# Patient Record
Sex: Female | Born: 1960 | Race: White | Hispanic: No | Marital: Married | Smoking: Never smoker
Health system: Southern US, Community
[De-identification: ages and names within clinical notes are randomized; demographics above are authoritative.]

## PROBLEM LIST (undated history)

## (undated) DIAGNOSIS — M25569 Pain in unspecified knee: Secondary | ICD-10-CM

## (undated) DIAGNOSIS — Z87442 Personal history of urinary calculi: Secondary | ICD-10-CM

## (undated) DIAGNOSIS — M1990 Unspecified osteoarthritis, unspecified site: Secondary | ICD-10-CM

## (undated) DIAGNOSIS — G43909 Migraine, unspecified, not intractable, without status migrainosus: Secondary | ICD-10-CM

## (undated) DIAGNOSIS — E785 Hyperlipidemia, unspecified: Secondary | ICD-10-CM

## (undated) DIAGNOSIS — T4145XA Adverse effect of unspecified anesthetic, initial encounter: Secondary | ICD-10-CM

## (undated) DIAGNOSIS — M2550 Pain in unspecified joint: Secondary | ICD-10-CM

## (undated) DIAGNOSIS — R0602 Shortness of breath: Secondary | ICD-10-CM

## (undated) DIAGNOSIS — Z803 Family history of malignant neoplasm of breast: Secondary | ICD-10-CM

## (undated) DIAGNOSIS — I2510 Atherosclerotic heart disease of native coronary artery without angina pectoris: Secondary | ICD-10-CM

## (undated) DIAGNOSIS — J189 Pneumonia, unspecified organism: Secondary | ICD-10-CM

## (undated) DIAGNOSIS — I10 Essential (primary) hypertension: Secondary | ICD-10-CM

## (undated) DIAGNOSIS — Z8489 Family history of other specified conditions: Secondary | ICD-10-CM

## (undated) DIAGNOSIS — R51 Headache: Secondary | ICD-10-CM

## (undated) DIAGNOSIS — R519 Headache, unspecified: Secondary | ICD-10-CM

## (undated) DIAGNOSIS — R600 Localized edema: Secondary | ICD-10-CM

## (undated) DIAGNOSIS — K829 Disease of gallbladder, unspecified: Secondary | ICD-10-CM

## (undated) DIAGNOSIS — M79606 Pain in leg, unspecified: Secondary | ICD-10-CM

## (undated) DIAGNOSIS — G4730 Sleep apnea, unspecified: Secondary | ICD-10-CM

## (undated) DIAGNOSIS — K5900 Constipation, unspecified: Secondary | ICD-10-CM

## (undated) DIAGNOSIS — Z8049 Family history of malignant neoplasm of other genital organs: Secondary | ICD-10-CM

## (undated) HISTORY — DX: Pain in unspecified joint: M25.50

## (undated) HISTORY — DX: Pain in leg, unspecified: M79.606

## (undated) HISTORY — PX: BREAST EXCISIONAL BIOPSY: SUR124

## (undated) HISTORY — DX: Unspecified osteoarthritis, unspecified site: M19.90

## (undated) HISTORY — DX: Sleep apnea, unspecified: G47.30

## (undated) HISTORY — DX: Essential (primary) hypertension: I10

## (undated) HISTORY — DX: Family history of malignant neoplasm of other genital organs: Z80.49

## (undated) HISTORY — DX: Hyperlipidemia, unspecified: E78.5

## (undated) HISTORY — DX: Migraine, unspecified, not intractable, without status migrainosus: G43.909

## (undated) HISTORY — DX: Disease of gallbladder, unspecified: K82.9

## (undated) HISTORY — DX: Localized edema: R60.0

## (undated) HISTORY — DX: Constipation, unspecified: K59.00

## (undated) HISTORY — DX: Family history of malignant neoplasm of breast: Z80.3

## (undated) HISTORY — DX: Pain in unspecified knee: M25.569

## (undated) HISTORY — DX: Shortness of breath: R06.02

---

## 1993-03-20 DIAGNOSIS — T8859XA Other complications of anesthesia, initial encounter: Secondary | ICD-10-CM | POA: Insufficient documentation

## 1993-03-20 HISTORY — PX: CHOLECYSTECTOMY: SHX55

## 1993-03-20 HISTORY — DX: Other complications of anesthesia, initial encounter: T88.59XA

## 1997-08-31 ENCOUNTER — Encounter: Admission: RE | Admit: 1997-08-31 | Discharge: 1997-11-29 | Payer: Self-pay | Admitting: Internal Medicine

## 1997-09-17 ENCOUNTER — Emergency Department (HOSPITAL_COMMUNITY): Admission: EM | Admit: 1997-09-17 | Discharge: 1997-09-17 | Payer: Self-pay | Admitting: Emergency Medicine

## 1997-10-02 ENCOUNTER — Ambulatory Visit (HOSPITAL_COMMUNITY): Admission: RE | Admit: 1997-10-02 | Discharge: 1997-10-02 | Payer: Self-pay | Admitting: Emergency Medicine

## 1997-12-24 ENCOUNTER — Ambulatory Visit (HOSPITAL_COMMUNITY): Admission: RE | Admit: 1997-12-24 | Discharge: 1997-12-24 | Payer: Self-pay | Admitting: Obstetrics and Gynecology

## 1998-01-12 ENCOUNTER — Ambulatory Visit (HOSPITAL_COMMUNITY): Admission: RE | Admit: 1998-01-12 | Discharge: 1998-01-12 | Payer: Self-pay | Admitting: Family Medicine

## 1998-02-21 ENCOUNTER — Ambulatory Visit (HOSPITAL_COMMUNITY): Admission: RE | Admit: 1998-02-21 | Discharge: 1998-02-21 | Payer: Self-pay | Admitting: Obstetrics & Gynecology

## 1998-08-13 ENCOUNTER — Inpatient Hospital Stay (HOSPITAL_COMMUNITY): Admission: AD | Admit: 1998-08-13 | Discharge: 1998-08-13 | Payer: Self-pay | Admitting: Obstetrics and Gynecology

## 1998-10-07 ENCOUNTER — Inpatient Hospital Stay (HOSPITAL_COMMUNITY): Admission: AD | Admit: 1998-10-07 | Discharge: 1998-10-10 | Payer: Self-pay | Admitting: *Deleted

## 1998-11-17 ENCOUNTER — Other Ambulatory Visit: Admission: RE | Admit: 1998-11-17 | Discharge: 1998-11-17 | Payer: Self-pay | Admitting: Obstetrics and Gynecology

## 1999-12-31 ENCOUNTER — Ambulatory Visit (HOSPITAL_COMMUNITY): Admission: RE | Admit: 1999-12-31 | Discharge: 1999-12-31 | Payer: Self-pay | Admitting: Family Medicine

## 2000-03-12 ENCOUNTER — Other Ambulatory Visit: Admission: RE | Admit: 2000-03-12 | Discharge: 2000-03-12 | Payer: Self-pay | Admitting: Obstetrics and Gynecology

## 2001-10-15 ENCOUNTER — Encounter: Payer: Self-pay | Admitting: Emergency Medicine

## 2001-10-15 ENCOUNTER — Emergency Department (HOSPITAL_COMMUNITY): Admission: EM | Admit: 2001-10-15 | Discharge: 2001-10-15 | Payer: Self-pay | Admitting: Emergency Medicine

## 2003-01-02 ENCOUNTER — Encounter: Payer: Self-pay | Admitting: Emergency Medicine

## 2003-01-02 ENCOUNTER — Emergency Department (HOSPITAL_COMMUNITY): Admission: EM | Admit: 2003-01-02 | Discharge: 2003-01-03 | Payer: Self-pay | Admitting: Emergency Medicine

## 2004-03-03 ENCOUNTER — Ambulatory Visit: Payer: Self-pay | Admitting: Cardiology

## 2004-03-22 ENCOUNTER — Encounter: Admission: RE | Admit: 2004-03-22 | Discharge: 2004-06-20 | Payer: Self-pay

## 2004-04-21 ENCOUNTER — Ambulatory Visit: Payer: Self-pay | Admitting: Cardiology

## 2004-06-22 ENCOUNTER — Ambulatory Visit: Payer: Self-pay | Admitting: Cardiology

## 2004-07-14 ENCOUNTER — Ambulatory Visit: Payer: Self-pay | Admitting: Cardiology

## 2005-07-27 ENCOUNTER — Ambulatory Visit: Payer: Self-pay | Admitting: Cardiology

## 2006-09-27 ENCOUNTER — Ambulatory Visit: Payer: Self-pay | Admitting: Cardiology

## 2006-10-19 ENCOUNTER — Encounter: Payer: Self-pay | Admitting: Cardiology

## 2006-10-19 LAB — CONVERTED CEMR LAB
CO2: 28 meq/L (ref 19–32)
Chloride: 103 meq/L (ref 96–112)
Cholesterol: 160 mg/dL (ref 0–200)
Creatinine, Ser: 0.62 mg/dL (ref 0.40–1.20)
Glucose, Bld: 101 mg/dL — ABNORMAL HIGH (ref 70–99)
HDL: 50 mg/dL (ref >39)
Hgb A1c MFr Bld: 5.2 % (ref 4.6–6.1)
Sodium: 142 meq/L (ref 135–145)
Total CHOL/HDL Ratio: 3.2
VLDL: 25 mg/dL (ref 0–40)

## 2007-02-13 ENCOUNTER — Ambulatory Visit (HOSPITAL_COMMUNITY): Admission: RE | Admit: 2007-02-13 | Discharge: 2007-02-13 | Payer: Self-pay | Admitting: Obstetrics and Gynecology

## 2007-07-31 ENCOUNTER — Ambulatory Visit (HOSPITAL_COMMUNITY): Admission: RE | Admit: 2007-07-31 | Discharge: 2007-07-31 | Payer: Self-pay | Admitting: Podiatry

## 2008-07-04 DIAGNOSIS — E8881 Metabolic syndrome: Secondary | ICD-10-CM | POA: Insufficient documentation

## 2008-07-04 DIAGNOSIS — E785 Hyperlipidemia, unspecified: Secondary | ICD-10-CM | POA: Insufficient documentation

## 2008-07-04 DIAGNOSIS — I10 Essential (primary) hypertension: Secondary | ICD-10-CM | POA: Insufficient documentation

## 2008-07-29 ENCOUNTER — Telehealth: Payer: Self-pay | Admitting: Cardiology

## 2008-08-26 ENCOUNTER — Ambulatory Visit: Payer: Self-pay | Admitting: Cardiology

## 2008-09-28 ENCOUNTER — Emergency Department (HOSPITAL_COMMUNITY): Admission: EM | Admit: 2008-09-28 | Discharge: 2008-09-28 | Payer: Self-pay | Admitting: Emergency Medicine

## 2009-02-06 ENCOUNTER — Emergency Department (HOSPITAL_BASED_OUTPATIENT_CLINIC_OR_DEPARTMENT_OTHER): Admission: EM | Admit: 2009-02-06 | Discharge: 2009-02-06 | Payer: Self-pay | Admitting: Emergency Medicine

## 2009-03-03 ENCOUNTER — Ambulatory Visit (HOSPITAL_COMMUNITY): Admission: RE | Admit: 2009-03-03 | Discharge: 2009-03-03 | Payer: Self-pay | Admitting: Obstetrics and Gynecology

## 2009-03-08 ENCOUNTER — Emergency Department (HOSPITAL_BASED_OUTPATIENT_CLINIC_OR_DEPARTMENT_OTHER): Admission: EM | Admit: 2009-03-08 | Discharge: 2009-03-08 | Payer: Self-pay | Admitting: Emergency Medicine

## 2009-03-08 ENCOUNTER — Ambulatory Visit: Payer: Self-pay | Admitting: Diagnostic Radiology

## 2009-03-09 ENCOUNTER — Telehealth: Payer: Self-pay | Admitting: Cardiology

## 2009-03-10 ENCOUNTER — Encounter: Admission: RE | Admit: 2009-03-10 | Discharge: 2009-03-10 | Payer: Self-pay | Admitting: Obstetrics and Gynecology

## 2009-08-04 ENCOUNTER — Encounter: Admission: RE | Admit: 2009-08-04 | Discharge: 2009-08-04 | Payer: Self-pay | Admitting: Obstetrics and Gynecology

## 2009-08-05 ENCOUNTER — Encounter: Admission: RE | Admit: 2009-08-05 | Discharge: 2009-08-05 | Payer: Self-pay | Admitting: Obstetrics and Gynecology

## 2009-12-15 ENCOUNTER — Ambulatory Visit: Payer: Self-pay | Admitting: Cardiology

## 2009-12-15 DIAGNOSIS — E663 Overweight: Secondary | ICD-10-CM | POA: Insufficient documentation

## 2009-12-17 LAB — CONVERTED CEMR LAB
Alkaline Phosphatase: 65 units/L (ref 39–117)
Chloride: 105 meq/L (ref 96–112)
GFR calc non Af Amer: 116.88 mL/min (ref >60)
HDL: 45.7 mg/dL (ref >39.00)
Sodium: 141 meq/L (ref 135–145)
TSH: 1.89 microintl units/mL (ref 0.35–5.50)

## 2010-03-08 ENCOUNTER — Encounter
Admission: RE | Admit: 2010-03-08 | Discharge: 2010-03-08 | Payer: Self-pay | Source: Home / Self Care | Attending: Obstetrics and Gynecology | Admitting: Obstetrics and Gynecology

## 2010-04-10 ENCOUNTER — Encounter: Payer: Self-pay | Admitting: Obstetrics and Gynecology
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
## 2010-06-20 LAB — BASIC METABOLIC PANEL
BUN: 9 mg/dL (ref 6–23)
CO2: 30 mEq/L (ref 19–32)
Calcium: 9.2 mg/dL (ref 8.4–10.5)
Creatinine, Ser: 0.6 mg/dL (ref 0.4–1.2)
GFR calc non Af Amer: >60 mL/min (ref >60)
Glucose, Bld: 87 mg/dL (ref 70–99)

## 2010-06-20 LAB — CBC
HCT: 42 % (ref 36.0–46.0)
Hemoglobin: 13.9 g/dL (ref 12.0–15.0)
Platelets: 285 10*3/uL (ref 150–400)
RBC: 4.72 MIL/uL (ref 3.87–5.11)
WBC: 9.4 10*3/uL (ref 4.0–10.5)

## 2010-06-20 LAB — DIFFERENTIAL
Eosinophils Relative: 2 % (ref 0–5)
Lymphocytes Relative: 21 % (ref 12–46)
Monocytes Absolute: 0.6 10*3/uL (ref 0.1–1.0)
Monocytes Relative: 6 % (ref 3–12)
Neutro Abs: 6.3 10*3/uL (ref 1.7–7.7)

## 2010-06-20 LAB — URINALYSIS, ROUTINE W REFLEX MICROSCOPIC
Protein, ur: NEGATIVE mg/dL
Urobilinogen, UA: 0.2 mg/dL (ref 0.0–1.0)

## 2010-06-22 LAB — WOUND CULTURE

## 2010-06-30 ENCOUNTER — Other Ambulatory Visit: Payer: Self-pay | Admitting: Sports Medicine

## 2010-06-30 DIAGNOSIS — M25512 Pain in left shoulder: Secondary | ICD-10-CM

## 2010-07-01 ENCOUNTER — Ambulatory Visit
Admission: RE | Admit: 2010-07-01 | Discharge: 2010-07-01 | Disposition: A | Discharge status: Home / Self Care | Payer: BLUE CROSS/BLUE SHIELD | Source: Ambulatory Visit | Attending: Sports Medicine | Admitting: Sports Medicine

## 2010-07-01 DIAGNOSIS — M25512 Pain in left shoulder: Secondary | ICD-10-CM

## 2010-08-02 NOTE — Assessment & Plan Note (Signed)
[facility]                            CARDIOLOGY OFFICE NOTE   NAME:Booker, Anita Booker               MRN:          [id number]  DATE:09/27/2006                            DOB:          1960/04/22    Anita Booker comes in today for further management of her metabolic  syndrome, hypertension, and hyperlipidemia.   She has not been in the office in 2 years.  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  She has a total of 6 children!  She has no complaints other  than lower extremity edema.  It is particularly bad at the end of the  day.   MEDICINES:  1. Lipitor 20 mg a day.  2. Diovan HCT 160/25 daily.  3. Celebrex 200 mg a day.   Her blood pressure today is 146/95.  She brings a whole host of blood  pressures from work which show her under good control except for an  occasional one that is in the 140s.  Her diastolics are always good.  Her pulse today is 90 and regular.  Her weight is 268, down 9.  HEENT:  Normocephalic/atraumatic, PERRLA, extraocular movements intact,  sclerae are clear, facial symmetry is normal.  Carotid upstrokes were  equal bilaterally without bruits, no JVD, thyroid is not enlarged,  trachea is midline.  LUNGS:  Clear.  HEART:  Reveals a nondisplaced PMI, normal S1-S2 without gallop.  ABDOMINAL:  Soft, good bowel sounds.  EXTREMITIES:  Reveal 1-2+ pitting edema, pulses are intact.  NEURO:  Exam is intact.   EKG shows normal sinus rhythm with low voltage through every lead.  She  had poor R wave progression across the anterior precordium which appears  to be new, I suspect this is lead placement.   ASSESSMENT:  Anita Booker's lower extremity edema is worse.  This is  clearly multifactorial.  She is due blood work.   PLAN:  I have renewed all her medications but changed her Diovan  HCT to  just Diovan 160 daily.  I have also added furosemide 20 mg a day for  edema.  I renewed her Lipitor 20 mg a  day.  Will arrange for her to get fasting blood work including a chem-7,  LFTs, lipid profile, hemoglobin A1c in the next couple weeks.  I will  see her back again in a year.     Thomas C. Daleen Squibb, MD, [facility]  Electronically Signed    TCW/MedQ  DD: 09/27/2006  DT: 09/28/2006  Job #: [id number]   cc:   Sherry A. Rosalio Macadamia, M.D.

## 2010-12-09 ENCOUNTER — Telehealth: Payer: Self-pay | Admitting: Cardiology

## 2010-12-09 DIAGNOSIS — I10 Essential (primary) hypertension: Secondary | ICD-10-CM

## 2010-12-09 DIAGNOSIS — E663 Overweight: Secondary | ICD-10-CM

## 2010-12-09 DIAGNOSIS — E785 Hyperlipidemia, unspecified: Secondary | ICD-10-CM

## 2010-12-09 MED ORDER — FUROSEMIDE 20 MG PO TABS: 20.0000 mg | ORAL_TABLET | Freq: Every day | ORAL | Status: DC

## 2010-12-09 MED ORDER — ATORVASTATIN CALCIUM 20 MG PO TABS: 20.0000 mg | ORAL_TABLET | Freq: Every day | ORAL | Status: DC

## 2010-12-09 MED ORDER — VALSARTAN 160 MG PO TABS: 160.0000 mg | ORAL_TABLET | Freq: Every day | ORAL | Status: DC

## 2010-12-09 NOTE — Telephone Encounter (Signed)
Pt calling stating that she called for refill RX of Lasix 10 mg called Monday and then today and the RX is not ready. Therefore pt is going to need some samples or something in the mean time until pt Lasix comes in. Please return pt call to discuss further.

## 2010-12-09 NOTE — Telephone Encounter (Signed)
Prescriptions for lasix, diovan, and lipitor reordered. Fasting labs prior to pt 01/27/11 appt ordered for 01/25/11 I spoke with pt and she is out of her furosemide. Prescription called in. Mylo Red RN

## 2010-12-15 ENCOUNTER — Other Ambulatory Visit: Payer: Self-pay | Admitting: *Deleted

## 2010-12-15 MED ORDER — FUROSEMIDE 20 MG PO TABS: 20.0000 mg | ORAL_TABLET | Freq: Every day | ORAL | Status: DC

## 2011-01-25 ENCOUNTER — Other Ambulatory Visit (INDEPENDENT_AMBULATORY_CARE_PROVIDER_SITE_OTHER): Payer: BLUE CROSS/BLUE SHIELD | Admitting: *Deleted

## 2011-01-25 ENCOUNTER — Encounter: Payer: Self-pay | Admitting: *Deleted

## 2011-01-25 DIAGNOSIS — E663 Overweight: Secondary | ICD-10-CM

## 2011-01-25 DIAGNOSIS — E785 Hyperlipidemia, unspecified: Secondary | ICD-10-CM

## 2011-01-25 DIAGNOSIS — I10 Essential (primary) hypertension: Secondary | ICD-10-CM

## 2011-01-25 LAB — LIPID PANEL
Total CHOL/HDL Ratio: 4
VLDL: 15 mg/dL (ref 0.0–40.0)

## 2011-01-25 LAB — BASIC METABOLIC PANEL
BUN: 11 mg/dL (ref 6–23)
CO2: 30 mEq/L (ref 19–32)
GFR: 95.23 mL/min (ref >60.00)
Glucose, Bld: 92 mg/dL (ref 70–99)
Potassium: 3.8 mEq/L (ref 3.5–5.1)

## 2011-01-25 LAB — HEPATIC FUNCTION PANEL
ALT: 16 U/L (ref 0–35)
Bilirubin, Direct: 0.2 mg/dL (ref 0.0–0.3)
Total Bilirubin: 1.5 mg/dL — ABNORMAL HIGH (ref 0.3–1.2)

## 2011-01-27 ENCOUNTER — Ambulatory Visit (INDEPENDENT_AMBULATORY_CARE_PROVIDER_SITE_OTHER): Payer: BC Managed Care – PPO | Admitting: Cardiology

## 2011-01-27 ENCOUNTER — Encounter: Payer: Self-pay | Admitting: Cardiology

## 2011-01-27 VITALS — BP 142/88 | HR 82 | Ht 63.0 in | Wt 313.0 lb

## 2011-01-27 DIAGNOSIS — E785 Hyperlipidemia, unspecified: Secondary | ICD-10-CM

## 2011-01-27 DIAGNOSIS — I10 Essential (primary) hypertension: Secondary | ICD-10-CM

## 2011-01-27 DIAGNOSIS — E8881 Metabolic syndrome: Secondary | ICD-10-CM

## 2011-01-27 DIAGNOSIS — E663 Overweight: Secondary | ICD-10-CM

## 2011-01-27 MED ORDER — ATORVASTATIN CALCIUM 40 MG PO TABS: 40.0000 mg | ORAL_TABLET | Freq: Every day | ORAL | Status: DC

## 2011-01-27 NOTE — Progress Notes (Signed)
HPI Anita Booker comes in today for evaluation and management of her metabolic syndrome including hypertension, obesity, mixed hyperlipidemia, and increased risk for diabetes.  She is now seeing Dr. Foy Guadalajara for primary care. He  Her blood pressure has been under good control at home. She does not take her Lasix or her Diovan until the afternoon. It is elevated today but this is a morning appointment.  She denies any chest pain or ischemic symptoms. Denies orthopnea, PND or peripheral edema.  Her last cholesterol was 166 with an LDL 105, HDL 45 and 8 triglycerides were normal at 75. LFTs were normal. Vasculature was normal. TSH was normal. Hemoglobin A1c was 5.6. I reviewed this with her today and congratulated her on being compliant her weight stable.  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 She is not able to exercise.   Past Medical History  Diagnosis Date  . Unspecified essential hypertension   . Other and unspecified hyperlipidemia   . Dysmetabolic syndrome X     Past Surgical History  Procedure Date  . Cholecystectomy 1995    Family History  Problem Relation Age of Onset  . Lung cancer Father 57  . Heart attack Brother 68    History   Social History  . Marital Status: Married    Spouse Name: N/A    Number of Children: N/A  . Years of Education: N/A   Occupational History  . Not on file.   Social History Main Topics  . Smoking status: Never Smoker   . Smokeless tobacco: Not on file  . Alcohol Use: No  . Drug Use: No  . Sexually Active: Not on file   Other Topics Concern  . Not on file   Social History Narrative  . No narrative on file    Allergies  Allergen Reactions  . Codeine Phosphate     REACTION: unspecified    Current Outpatient Prescriptions  Medication Sig Dispense Refill  . aspirin 81 MG tablet Take 81 mg by mouth daily.        Marland Kitchen atorvastatin (LIPITOR) 20 MG tablet Take 1 tablet (20 mg total) by mouth daily.  90 tablet  3  . celecoxib  (CELEBREX) 200 MG capsule Take 200 mg by mouth daily.        . furosemide (LASIX) 20 MG tablet Take 1 tablet (20 mg total) by mouth daily.  30 tablet  1  . valsartan (DIOVAN) 160 MG tablet Take 1 tablet (160 mg total) by mouth daily.  90 tablet  3    ROS Negative other than HPI.   PE General Appearance: well developed, well nourished in no acute distress, obese HEENT: symmetrical face, PERRLA, good dentition  Neck: no JVD, thyromegaly, or adenopathy, trachea midline Chest: symmetric without deformity Cardiac: PMI non-displaced, RRR, normal S1, S2, no gallop or murmur Lung: clear to ausculation and percussion Vascular: all pulses full without bruits  Abdominal: nondistended, nontender, good bowel sounds, no HSM, no bruits Extremities: no cyanosis, clubbing or edema, no sign of DVT, no varicosities  Skin: normal color, no rashes Neuro: alert and oriented x 3, non-focal Pysch: normal affect Filed Vitals:   01/27/11 0938  BP: 142/88  Pulse: 82  Height: 5\' 3"  (1.6 m)  Weight: 313 lb (141.976 kg)    EKG Normal sinus rhythm, low voltage, otherwise normal EKG. Labs and Studies Reviewed.   Lab Results  Component Value Date   WBC 9.4 03/08/2009   HGB 13.9 03/08/2009  HCT 42.0 03/08/2009   MCV 89.0 03/08/2009   PLT 285 03/08/2009      Chemistry      Component Value Date/Time   NA 140 01/25/2011 0859   K 3.8 01/25/2011 0859   CL 104 01/25/2011 0859   CO2 30 01/25/2011 0859   BUN 11 01/25/2011 0859   CREATININE 0.7 01/25/2011 0859      Component Value Date/Time   CALCIUM 8.8 01/25/2011 0859   ALKPHOS 70 01/25/2011 0859   AST 16 01/25/2011 0859   ALT 16 01/25/2011 0859   BILITOT 1.5* 01/25/2011 0859       Lab Results  Component Value Date   CHOL 166 01/25/2011   CHOL 162 12/15/2009   CHOL 160 10/19/2006   Lab Results  Component Value Date   HDL 45.80 01/25/2011   HDL 16.10 12/15/2009   HDL 50 10/19/2006   Lab Results  Component Value Date   LDLCALC 105* 01/25/2011    LDLCALC 88 12/15/2009   LDLCALC 85 10/19/2006   Lab Results  Component Value Date   TRIG 75.0 01/25/2011   TRIG 141.0 12/15/2009   TRIG 123 10/19/2006   Lab Results  Component Value Date   CHOLHDL 4 01/25/2011   CHOLHDL 4 12/15/2009   CHOLHDL 3.2 Ratio 10/19/2006   Lab Results  Component Value Date   HGBA1C 5.6 01/25/2011   Lab Results  Component Value Date   ALT 16 01/25/2011   AST 16 01/25/2011   ALKPHOS 70 01/25/2011   BILITOT 1.5* 01/25/2011   Lab Results  Component Value Date   TSH 1.37 01/25/2011

## 2011-01-27 NOTE — Patient Instructions (Signed)
Your physician has recommended you make the following change in your medication:   Increase Atorvastatin ( Lipitor)  Your physician recommends that you return for lab work in: 6 week for fasting cholesterol labs & liver  Your physician recommends that you schedule a follow-up appointment in: as needed with Dr. Daleen Squibb

## 2011-01-27 NOTE — Assessment & Plan Note (Signed)
This is a well controlled except for blood pressure. I've advised to take her valsartan in the morning. Blood pressure goals given. Also encouraged to continue to keep her weight stable, continue the heart healthy which he does, and avoid excess salt. I will turn her care over to Dr. Foy Guadalajara of primary care. We'll be happy to see her if any cardiac issues arise. No changes in medications made.

## 2011-01-27 NOTE — Assessment & Plan Note (Signed)
Laboratory data reviewed and copy given. I have increased her atorvastatin 40 mg p.o. Q.h.s. Check lipids in 6 weeks. LDL goal less than 100.

## 2011-03-22 ENCOUNTER — Other Ambulatory Visit: Payer: Self-pay | Admitting: Cardiology

## 2011-03-22 NOTE — Telephone Encounter (Signed)
New Msg: Pt would like 90 day supply of refills.

## 2011-03-23 ENCOUNTER — Other Ambulatory Visit: Payer: BC Managed Care – PPO | Admitting: *Deleted

## 2011-03-23 MED ORDER — VALSARTAN 160 MG PO TABS: 160.0000 mg | ORAL_TABLET | Freq: Every day | ORAL | Status: DC

## 2011-03-23 MED ORDER — ATORVASTATIN CALCIUM 40 MG PO TABS: 40.0000 mg | ORAL_TABLET | Freq: Every day | ORAL | Status: DC

## 2011-03-23 MED ORDER — FUROSEMIDE 20 MG PO TABS: 20.0000 mg | ORAL_TABLET | Freq: Every day | ORAL | Status: DC

## 2011-04-07 ENCOUNTER — Telehealth: Payer: Self-pay | Admitting: *Deleted

## 2011-04-07 DIAGNOSIS — E785 Hyperlipidemia, unspecified: Secondary | ICD-10-CM

## 2011-04-07 NOTE — Telephone Encounter (Signed)
Called pt about overdue repeat lipid and liver since increased atorvastatin. Pt will come in 04/11/11  Mylo Red RN

## 2011-04-11 ENCOUNTER — Other Ambulatory Visit (INDEPENDENT_AMBULATORY_CARE_PROVIDER_SITE_OTHER): Payer: BC Managed Care – PPO | Admitting: *Deleted

## 2011-04-11 DIAGNOSIS — E785 Hyperlipidemia, unspecified: Secondary | ICD-10-CM

## 2011-04-11 LAB — LIPID PANEL
HDL: 45.1 mg/dL (ref >39.00)
Total CHOL/HDL Ratio: 3

## 2011-04-11 LAB — HEPATIC FUNCTION PANEL
AST: 19 U/L (ref 0–37)
Alkaline Phosphatase: 70 U/L (ref 39–117)
Bilirubin, Direct: 0.1 mg/dL (ref 0.0–0.3)
Total Protein: 6.8 g/dL (ref 6.0–8.3)

## 2011-06-30 ENCOUNTER — Encounter (HOSPITAL_COMMUNITY): Payer: Self-pay | Admitting: Emergency Medicine

## 2011-06-30 ENCOUNTER — Inpatient Hospital Stay (HOSPITAL_COMMUNITY)
Admission: EM | Admit: 2011-06-30 | Discharge: 2011-07-03 | DRG: 262 | Disposition: A | Discharge status: Home / Self Care | Payer: BC Managed Care – PPO | Attending: General Surgery | Admitting: General Surgery

## 2011-06-30 DIAGNOSIS — A4901 Methicillin susceptible Staphylococcus aureus infection, unspecified site: Secondary | ICD-10-CM | POA: Diagnosis present

## 2011-06-30 DIAGNOSIS — Z6841 Body Mass Index (BMI) 40.0 and over, adult: Secondary | ICD-10-CM

## 2011-06-30 DIAGNOSIS — I10 Essential (primary) hypertension: Secondary | ICD-10-CM | POA: Diagnosis present

## 2011-06-30 DIAGNOSIS — E8881 Metabolic syndrome: Secondary | ICD-10-CM | POA: Diagnosis present

## 2011-06-30 DIAGNOSIS — E785 Hyperlipidemia, unspecified: Secondary | ICD-10-CM | POA: Diagnosis present

## 2011-06-30 DIAGNOSIS — N61 Mastitis without abscess: Principal | ICD-10-CM

## 2011-06-30 DIAGNOSIS — Z79899 Other long term (current) drug therapy: Secondary | ICD-10-CM

## 2011-06-30 DIAGNOSIS — Z7982 Long term (current) use of aspirin: Secondary | ICD-10-CM

## 2011-06-30 LAB — BASIC METABOLIC PANEL
CO2: 29 mEq/L (ref 19–32)
Chloride: 98 mEq/L (ref 96–112)
GFR calc Af Amer: >90 mL/min (ref >90)
Sodium: 138 mEq/L (ref 135–145)

## 2011-06-30 LAB — DIFFERENTIAL
Basophils Absolute: 0 10*3/uL (ref 0.0–0.1)
Basophils Relative: 0 % (ref 0–1)
Lymphocytes Relative: 10 % — ABNORMAL LOW (ref 12–46)
Neutro Abs: 16.1 10*3/uL — ABNORMAL HIGH (ref 1.7–7.7)
Neutrophils Relative %: 84 % — ABNORMAL HIGH (ref 43–77)

## 2011-06-30 LAB — CBC
HCT: 40.3 % (ref 36.0–46.0)
MCHC: 32.8 g/dL (ref 30.0–36.0)
Platelets: 297 10*3/uL (ref 150–400)
RDW: 13.5 % (ref 11.5–15.5)
WBC: 19.2 10*3/uL — ABNORMAL HIGH (ref 4.0–10.5)

## 2011-06-30 MED ORDER — PIPERACILLIN-TAZOBACTAM 3.375 G IVPB
3.3750 g | Freq: Three times a day (TID) | INTRAVENOUS | Status: DC
  Administered 2011-07-01 – 2011-07-03 (×7): 3.375 g via INTRAVENOUS
  Filled 2011-06-30 (×11): qty 50

## 2011-06-30 MED ORDER — ONDANSETRON HCL 4 MG/2ML IJ SOLN
4.0000 mg | Freq: Once | INTRAMUSCULAR | Status: AC
  Administered 2011-06-30: 4 mg via INTRAVENOUS
  Filled 2011-06-30: qty 2

## 2011-06-30 MED ORDER — VANCOMYCIN HCL IN DEXTROSE 1-5 GM/200ML-% IV SOLN
1000.0000 mg | Freq: Once | INTRAVENOUS | Status: AC
  Administered 2011-06-30: 1000 mg via INTRAVENOUS
  Filled 2011-06-30: qty 200

## 2011-06-30 MED ORDER — VANCOMYCIN HCL 500 MG IV SOLR
500.0000 mg | Freq: Once | INTRAVENOUS | Status: AC
  Administered 2011-07-01: 500 mg via INTRAVENOUS
  Filled 2011-06-30: qty 500

## 2011-06-30 MED ORDER — MORPHINE SULFATE 2 MG/ML IJ SOLN
2.0000 mg | INTRAMUSCULAR | Status: DC | PRN
  Administered 2011-07-01: 2 mg via INTRAVENOUS
  Filled 2011-06-30: qty 1

## 2011-06-30 MED ORDER — VANCOMYCIN HCL 1000 MG IV SOLR
1500.0000 mg | Freq: Two times a day (BID) | INTRAVENOUS | Status: DC
  Administered 2011-07-01 – 2011-07-03 (×4): 1500 mg via INTRAVENOUS
  Filled 2011-06-30 (×6): qty 1500

## 2011-06-30 MED ORDER — KCL IN DEXTROSE-NACL 20-5-0.9 MEQ/L-%-% IV SOLN
INTRAVENOUS | Status: DC
  Administered 2011-07-01: 100 mL/h via INTRAVENOUS
  Filled 2011-06-30 (×4): qty 1000

## 2011-06-30 MED ORDER — PIPERACILLIN-TAZOBACTAM 3.375 G IVPB
3.3750 g | Freq: Once | INTRAVENOUS | Status: AC
  Administered 2011-07-01: 3.375 g via INTRAVENOUS
  Filled 2011-06-30: qty 50

## 2011-06-30 MED ORDER — MORPHINE SULFATE 4 MG/ML IJ SOLN: 4.0000 mg | Freq: Once | INTRAMUSCULAR | Status: DC

## 2011-06-30 MED ORDER — SODIUM CHLORIDE 0.9 % IV BOLUS (SEPSIS)
1000.0000 mL | Freq: Once | INTRAVENOUS | Status: AC
  Administered 2011-06-30: 1000 mL via INTRAVENOUS

## 2011-06-30 MED ORDER — MORPHINE SULFATE 2 MG/ML IJ SOLN
INTRAMUSCULAR | Status: AC
  Administered 2011-06-30: 4 mg via INTRAMUSCULAR
  Filled 2011-06-30: qty 2

## 2011-06-30 MED ORDER — OXYCODONE HCL 5 MG PO TABS
5.0000 mg | ORAL_TABLET | ORAL | Status: DC | PRN
  Administered 2011-07-01 – 2011-07-02 (×3): 5 mg via ORAL
  Filled 2011-06-30 (×3): qty 1

## 2011-06-30 MED ORDER — FUROSEMIDE 20 MG PO TABS
20.0000 mg | ORAL_TABLET | Freq: Every day | ORAL | Status: DC
  Administered 2011-07-02 – 2011-07-03 (×2): 20 mg via ORAL
  Filled 2011-06-30 (×3): qty 1

## 2011-06-30 MED ORDER — IRBESARTAN 150 MG PO TABS
150.0000 mg | ORAL_TABLET | Freq: Every day | ORAL | Status: DC
  Administered 2011-07-02 – 2011-07-03 (×2): 150 mg via ORAL
  Filled 2011-06-30 (×3): qty 1

## 2011-06-30 MED ORDER — ONDANSETRON HCL 4 MG/2ML IJ SOLN: 4.0000 mg | Freq: Four times a day (QID) | INTRAMUSCULAR | Status: DC | PRN

## 2011-06-30 NOTE — H&P (Signed)
Anita Booker is an 51 y.o. female.   Chief Complaint:   Right breast infection HPI:   This is a 51 year old female who noted redness in her right breast about 3 days ago. She saw her primary care physician a couple of days ago and was given intra-muscular Rocephin and started on Bactrim. She went back to her primary care physician today because the situation became worse despite oral antibiotic treatment.  She reports having fevers as well as headaches. She also said some nausea and vomiting. She denies any trauma to the breast. She denies having a previous infection like this. She gets mammograms every 6 months.  Past Medical History  Diagnosis Date  . Unspecified essential hypertension   . Other and unspecified hyperlipidemia   . Dysmetabolic syndrome X     Past Surgical History  Procedure Date  . Cholecystectomy 1995    Family History  Problem Relation Age of Onset  . Lung cancer Father 61  . Heart attack Brother 42   Social History:  reports that she has never smoked. She does not have any smokeless tobacco history on file. She reports that she does not drink alcohol or use illicit drugs. Prior to Admission medications   Medication Sig Start Date End Date Taking? Authorizing Provider  acetaminophen (TYLENOL) 500 MG tablet Take 500 mg by mouth every 6 (six) hours as needed. For pain relief   Yes Historical Provider, MD  aspirin 81 MG tablet Take 81 mg by mouth daily.     Yes Historical Provider, MD  atorvastatin (LIPITOR) 40 MG tablet Take 1 tablet (40 mg total) by mouth daily. 03/22/11 03/21/12 Yes Gaylord Shih, MD  celecoxib (CELEBREX) 200 MG capsule Take 200 mg by mouth daily.     Yes Historical Provider, MD  furosemide (LASIX) 20 MG tablet Take 1 tablet (20 mg total) by mouth daily. 03/22/11 03/21/12 Yes Gaylord Shih, MD  sulfamethoxazole-trimethoprim (BACTRIM DS,SEPTRA DS) 800-160 MG per tablet Take 1 tablet by mouth 2 (two) times daily.   Yes Historical Provider, MD  valsartan  (DIOVAN) 160 MG tablet Take 1 tablet (160 mg total) by mouth daily. 03/22/11 03/21/12 Yes Gaylord Shih, MD   Allergies:  Allergies  Allergen Reactions  . Codeine Phosphate     REACTION: unspecified    Medications Prior to Admission  Medication Dose Route Frequency Provider Last Rate Last Dose  . dextrose 5 % and 0.9 % NaCl with KCl 20 mEq/L infusion   Intravenous Continuous Adolph Pollack, MD      . furosemide (LASIX) tablet 20 mg  20 mg Oral Daily Adolph Pollack, MD      . irbesartan (AVAPRO) tablet 150 mg  150 mg Oral Daily Adolph Pollack, MD      . morphine 2 MG/ML injection 2-6 mg  2-6 mg Intravenous Q2H PRN Adolph Pollack, MD      . morphine 2 MG/ML injection        4 mg at 06/30/11 2134  . ondansetron (ZOFRAN) injection 4 mg  4 mg Intravenous Once Angus Seller, PA   4 mg at 06/30/11 2131  . ondansetron (ZOFRAN) injection 4 mg  4 mg Intravenous Q6H PRN Adolph Pollack, MD      . oxyCODONE (Oxy IR/ROXICODONE) immediate release tablet 5 mg  5 mg Oral Q4H PRN Adolph Pollack, MD      . piperacillin-tazobactam (ZOSYN) IVPB 3.375 g  3.375 g Intravenous Once Phill Mutter  Dammen, PA      . piperacillin-tazobactam (ZOSYN) IVPB 3.375 g  3.375 g Intravenous Q8H Adolph Pollack, MD      . sodium chloride 0.9 % bolus 1,000 mL  1,000 mL Intravenous Once Angus Seller, PA   1,000 mL at 06/30/11 2131  . vancomycin (VANCOCIN) IVPB 1000 mg/200 mL premix  1,000 mg Intravenous Once Angus Seller, PA   1,000 mg at 06/30/11 2251  . DISCONTD: morphine 4 MG/ML injection 4 mg  4 mg Intravenous Once Angus Seller, PA       Medications Prior to Admission  Medication Sig Dispense Refill  . aspirin 81 MG tablet Take 81 mg by mouth daily.        Marland Kitchen atorvastatin (LIPITOR) 40 MG tablet Take 1 tablet (40 mg total) by mouth daily.  90 tablet  3  . celecoxib (CELEBREX) 200 MG capsule Take 200 mg by mouth daily.        . furosemide (LASIX) 20 MG tablet Take 1 tablet (20 mg total) by mouth daily.  90  tablet  3  . valsartan (DIOVAN) 160 MG tablet Take 1 tablet (160 mg total) by mouth daily.  90 tablet  3   History   Social History  . Marital Status: Married    Spouse Name: N/A    Number of Children: N/A  . Years of Education: N/A   Occupational History  . Not on file.   Social History Main Topics  . Smoking status: Never Smoker   . Smokeless tobacco: Not on file  . Alcohol Use: No  . Drug Use: No  . Sexually Active: Not on file   Other Topics Concern  . Not on file   Social History Narrative  . No narrative on file   Family History  Problem Relation Age of Onset  . Lung cancer Father 3  . Heart attack Brother 48    Results for orders placed during the hospital encounter of 06/30/11 (from the past 48 hour(s))  CBC     Status: Abnormal   Collection Time   06/30/11  9:02 PM      Component Value Range Comment   WBC 19.2 (*) 4.0 - 10.5 (K/uL)    RBC 4.50  3.87 - 5.11 (MIL/uL)    Hemoglobin 13.2  12.0 - 15.0 (g/dL)    HCT 16.1  09.6 - 04.5 (%)    MCV 89.6  78.0 - 100.0 (fL)    MCH 29.3  26.0 - 34.0 (pg)    MCHC 32.8  30.0 - 36.0 (g/dL)    RDW 40.9  81.1 - 91.4 (%)    Platelets 297  150 - 400 (K/uL)   DIFFERENTIAL     Status: Abnormal   Collection Time   06/30/11  9:02 PM      Component Value Range Comment   Neutrophils Relative 84 (*) 43 - 77 (%)    Neutro Abs 16.1 (*) 1.7 - 7.7 (K/uL)    Lymphocytes Relative 10 (*) 12 - 46 (%)    Lymphs Abs 1.8  0.7 - 4.0 (K/uL)    Monocytes Relative 6  3 - 12 (%)    Monocytes Absolute 1.1 (*) 0.1 - 1.0 (K/uL)    Eosinophils Relative 1  0 - 5 (%)    Eosinophils Absolute 0.2  0.0 - 0.7 (K/uL)    Basophils Relative 0  0 - 1 (%)    Basophils Absolute 0.0  0.0 - 0.1 (  K/uL)   BASIC METABOLIC PANEL     Status: Abnormal   Collection Time   06/30/11  9:02 PM      Component Value Range Comment   Sodium 138  135 - 145 (mEq/L)    Potassium 3.4 (*) 3.5 - 5.1 (mEq/L)    Chloride 98  96 - 112 (mEq/L)    CO2 29  19 - 32 (mEq/L)     Glucose, Bld 96  70 - 99 (mg/dL)    BUN 8  6 - 23 (mg/dL)    Creatinine, Ser 4.09  0.50 - 1.10 (mg/dL)    Calcium 9.4  8.4 - 10.5 (mg/dL)    GFR calc non Af Amer >90  >90 (mL/min)    GFR calc Af Amer >90  >90 (mL/min)    No results found.  Review of Systems  Constitutional: Positive for fever and chills.  Respiratory: Negative for cough and shortness of breath.   Cardiovascular: Negative for chest pain.  Gastrointestinal: Positive for nausea and vomiting. Negative for abdominal pain and diarrhea.  Genitourinary: Negative for dysuria and hematuria.  Neurological: Positive for headaches.  Endo/Heme/Allergies: Does not bruise/bleed easily.    Blood pressure 140/75, pulse 89, temperature 98.8 F (37.1 C), temperature source Oral, resp. rate 20, SpO2 98.00%. Physical Exam  Constitutional:       Morbidly obese female in NAD.    HENT:  Head: Normocephalic and atraumatic.  Eyes: No scleral icterus.  Neck: Neck supple.  Cardiovascular: Normal rate and regular rhythm.   Respiratory: Effort normal and breath sounds normal.       The right breast is erythematous with a large firm indurated area laterally and a suggestion of fluctuance laterally.  The left breast is without masses or suspicious changes.  GI: Soft. She exhibits no distension and no mass. There is no tenderness.  Musculoskeletal: She exhibits no edema.       No axillary adenopathy.  Lymphadenopathy:    She has no cervical adenopathy.  Skin: Skin is warm and dry. There is erythema (Right breast).     Assessment/Plan Acute right mastitis with probable abscess. She also has a metabolic syndrome. She is currently receiving vancomycin in the emergency room  Plan: Admit to the hospital. Continue IV vancomycin and IV Zosyn. I've recommended that the situation does not improve significantly by tomorrow morning that she go to the operating room for an incision and drainage of what I feel is a right breast abscess. I've discussed  the procedure and the risks with her. The risks included but not limited to bleeding, infection, wound healing problems, anesthesia, cosmetic deformity. I've also told her it may require more than one operation. She seems to understand this and agrees with the plan.  Crist Kruszka J 06/30/2011, 11:20 PM

## 2011-06-30 NOTE — ED Notes (Signed)
Sent by Dr. Lenise Arena at [facility] for RT breast swelling, redness and pain. Also c/o n/v and chills x 3 days.  States she her PMD called in for her to be admitted.

## 2011-06-30 NOTE — ED Provider Notes (Signed)
History     CSN: [id number]  Arrival date & time 06/30/11  1637   First MD Initiated Contact with Patient 06/30/11 2037      Chief Complaint  Patient presents with  . Breast Pain    RT side  . Fever    HPI  History provided by the patient. Patient is a 51 year old female with history of hypertension hyperlipidemia who presents with complaints of worsening pain redness to right breasts. Patient reports symptoms first began 4 days ago. She was seen by her PCP Dr. Izola Price 3 days ago in the office and given a shot of IM ceftriaxone and prescription for Septra. Patient has been taking this since that time and had a recheck of symptoms earlier today. Patient reports worsening redness and swelling and pain. Patient also reports fevers, chills nausea vomiting episodes today. Patient was referred by PCP to emergency room for further evaluation and treatment. Patient denies any aggravating or alleviating factors.    Past Medical History  Diagnosis Date  . Unspecified essential hypertension   . Other and unspecified hyperlipidemia   . Dysmetabolic syndrome X     Past Surgical History  Procedure Date  . Cholecystectomy 1995    Family History  Problem Relation Age of Onset  . Lung cancer Father 56  . Heart attack Brother 48    History  Substance Use Topics  . Smoking status: Never Smoker   . Smokeless tobacco: Not on file  . Alcohol Use: No    OB History    Grav Para Term Preterm Abortions TAB SAB Ect Mult Living                  Review of Systems  Constitutional: Positive for fever and chills.  Gastrointestinal: Positive for nausea and vomiting. Negative for abdominal pain.  Neurological: Positive for headaches. Negative for dizziness and light-headedness.    Allergies  Codeine phosphate  Home Medications   Current Outpatient Rx  Name Route Sig Dispense Refill  . ACETAMINOPHEN 500 MG PO TABS Oral Take 500 mg by mouth every 6 (six) hours as needed. For pain relief      . ASPIRIN 81 MG PO TABS Oral Take 81 mg by mouth daily.      . ATORVASTATIN CALCIUM 40 MG PO TABS Oral Take 1 tablet (40 mg total) by mouth daily. 90 tablet 3  . CELECOXIB 200 MG PO CAPS Oral Take 200 mg by mouth daily.      . FUROSEMIDE 20 MG PO TABS Oral Take 1 tablet (20 mg total) by mouth daily. 90 tablet 3  . SULFAMETHOXAZOLE-TRIMETHOPRIM 800-160 MG PO TABS Oral Take 1 tablet by mouth 2 (two) times daily.    Marland Kitchen VALSARTAN 160 MG PO TABS Oral Take 1 tablet (160 mg total) by mouth daily. 90 tablet 3    BP 140/75  Pulse 89  Temp(Src) 98.8 F (37.1 C) (Oral)  Resp 20  SpO2 98%  Physical Exam  Nursing note and vitals reviewed. Constitutional: She is oriented to person, place, and time. She appears well-developed and well-nourished. No distress.  HENT:  Head: Normocephalic.  Cardiovascular: Normal rate and regular rhythm.   Pulmonary/Chest: Effort normal and breath sounds normal.         Chaperone was present. Erythema and induration to majority of right breast. No discharge or bleeding from nipple.  Abdominal: Soft.  Neurological: She is alert and oriented to person, place, and time.  Skin: Skin is warm and dry. No  rash noted.  Psychiatric: She has a normal mood and affect. Her behavior is normal.    ED Course  Procedures   Results for orders placed during the hospital encounter of 06/30/11  CBC      Component Value Range   WBC 19.2 (*) 4.0 - 10.5 (K/uL)   RBC 4.50  3.87 - 5.11 (MIL/uL)   Hemoglobin 13.2  12.0 - 15.0 (g/dL)   HCT 16.1  09.6 - 04.5 (%)   MCV 89.6  78.0 - 100.0 (fL)   MCH 29.3  26.0 - 34.0 (pg)   MCHC 32.8  30.0 - 36.0 (g/dL)   RDW 40.9  81.1 - 91.4 (%)   Platelets 297  150 - 400 (K/uL)  DIFFERENTIAL      Component Value Range   Neutrophils Relative 84 (*) 43 - 77 (%)   Neutro Abs 16.1 (*) 1.7 - 7.7 (K/uL)   Lymphocytes Relative 10 (*) 12 - 46 (%)   Lymphs Abs 1.8  0.7 - 4.0 (K/uL)   Monocytes Relative 6  3 - 12 (%)   Monocytes Absolute 1.1 (*) 0.1  - 1.0 (K/uL)   Eosinophils Relative 1  0 - 5 (%)   Eosinophils Absolute 0.2  0.0 - 0.7 (K/uL)   Basophils Relative 0  0 - 1 (%)   Basophils Absolute 0.0  0.0 - 0.1 (K/uL)  BASIC METABOLIC PANEL      Component Value Range   Sodium 138  135 - 145 (mEq/L)   Potassium 3.4 (*) 3.5 - 5.1 (mEq/L)   Chloride 98  96 - 112 (mEq/L)   CO2 29  19 - 32 (mEq/L)   Glucose, Bld 96  70 - 99 (mg/dL)   BUN 8  6 - 23 (mg/dL)   Creatinine, Ser 7.82  0.50 - 1.10 (mg/dL)   Calcium 9.4  8.4 - 95.6 (mg/dL)   GFR calc non Af Amer >90  >90 (mL/min)   GFR calc Af Amer >90  >90 (mL/min)        1. Mastitis       MDM   8:35 PM patient seen and evaluated. Patient no acute distress.    Pt seen and discussed with Attending Physician.  Plan to discuss with gen surgery.  Gen surgery will see pt.    Angus Seller, [place] 06/30/11 2236

## 2011-06-30 NOTE — ED Notes (Signed)
Per pt: swelling and redness to right breast that began on Tuesday, pt was given an IM Abx on Wednesday at PCP and started Septra that night. Redness, pain and general malaise has worsened since then and PCP sent pt to be evaluated.

## 2011-06-30 NOTE — Progress Notes (Addendum)
ANTIBIOTIC CONSULT NOTE - INITIAL  Pharmacy Consult for Vancomycin Indication: Mastitis with suspected abscess  Allergies  Allergen Reactions  . Codeine Phosphate     REACTION: unspecified    Patient Measurements: Last documented weight = 142 kg (01/27/11)  Vital Signs: Temp: 98.8 F (37.1 C) (04/12 1759) Temp src: Oral (04/12 1759) BP: 140/75 mmHg (04/12 1745) Pulse Rate: 89  (04/12 1745) Intake/Output from previous day:   Intake/Output from this shift:    Labs:  [facility] 06/30/11 2102  WBC 19.2*  HGB 13.2  PLT 297  LABCREA --  CREATININE 0.73   The CrCl is unknown because both a height and weight (above a minimum accepted value) are required for this calculation. No results found for this basename: VANCOTROUGH:2,VANCOPEAK:2,VANCORANDOM:2,GENTTROUGH:2,GENTPEAK:2,GENTRANDOM:2,TOBRATROUGH:2,TOBRAPEAK:2,TOBRARND:2,AMIKACINPEAK:2,AMIKACINTROU:2,AMIKACIN:2, in the last 72 hours   Microbiology: No results found for this or any previous visit (from the past 720 hour(s)).  Medical History: Past Medical History  Diagnosis Date  . Unspecified essential hypertension   . Other and unspecified hyperlipidemia   . Dysmetabolic syndrome X     Medications:  Scheduled:    . furosemide  20 mg Oral Daily  . irbesartan  150 mg Oral Daily  . morphine      . ondansetron (ZOFRAN) IV  4 mg Intravenous Once  . piperacillin-tazobactam (ZOSYN)  IV  3.375 g Intravenous Once  . piperacillin-tazobactam (ZOSYN)  IV  3.375 g Intravenous Q8H  . sodium chloride  1,000 mL Intravenous Once  . vancomycin  1,000 mg Intravenous Once  . DISCONTD:  morphine injection  4 mg Intravenous Once   Infusions:    . dextrose 5 % and 0.9 % NaCl with KCl 20 mEq/L     Assessment:  51 year old female with complaint of redness in breast, fevers, headaches, nausea, vomiting  CrCl ~ 95 ml/min  Patient received Vancomycin 1gm IV x 1 in ED @ 22:51  IV Vancomycin & Zosyn to continue for mastitis with  suspected breast abscess  Goal of Therapy:  Vancomycin trough level 15-20 mcg/ml  Plan:   Due to morbid obesity, will give additional Vancomycin 500mg  IV x 1 dose now then continue Vancomycin 1500mg  IV q12h  Follow any cultures & sensitivities  Once patient weighed and documented, will verify dosing regimen   Shaquan Missey Trefz 06/30/2011,11:38 PM  Weight = 142.8 kg; continue with Vancomycin regimen. Will check trough level when at steady state.

## 2011-07-01 ENCOUNTER — Encounter (HOSPITAL_COMMUNITY): Payer: Self-pay | Admitting: Anesthesiology

## 2011-07-01 ENCOUNTER — Inpatient Hospital Stay (HOSPITAL_COMMUNITY): Payer: BC Managed Care – PPO | Admitting: Anesthesiology

## 2011-07-01 ENCOUNTER — Encounter (HOSPITAL_COMMUNITY): Admission: EM | Disposition: A | Discharge status: Home / Self Care | Payer: Self-pay | Source: Home / Self Care

## 2011-07-01 ENCOUNTER — Emergency Department (HOSPITAL_COMMUNITY): Payer: BC Managed Care – PPO

## 2011-07-01 HISTORY — PX: BREAST SURGERY: SHX581

## 2011-07-01 LAB — SURGICAL PCR SCREEN
MRSA, PCR: NEGATIVE
Staphylococcus aureus: POSITIVE — AB

## 2011-07-01 SURGERY — INCISION AND DRAINAGE, ABSCESS
Anesthesia: General | Site: Breast | Laterality: Right | Wound class: Dirty or Infected

## 2011-07-01 MED ORDER — SUCCINYLCHOLINE CHLORIDE 20 MG/ML IJ SOLN
INTRAMUSCULAR | Status: DC | PRN
  Administered 2011-07-01: 150 mg via INTRAVENOUS

## 2011-07-01 MED ORDER — FENTANYL CITRATE 0.05 MG/ML IJ SOLN
INTRAMUSCULAR | Status: AC
  Filled 2011-07-01: qty 2

## 2011-07-01 MED ORDER — PROPOFOL 10 MG/ML IV EMUL
INTRAVENOUS | Status: DC | PRN
  Administered 2011-07-01: 290 mg via INTRAVENOUS

## 2011-07-01 MED ORDER — LIDOCAINE-EPINEPHRINE 1 %-1:100000 IJ SOLN: INTRAMUSCULAR | Status: DC | PRN

## 2011-07-01 MED ORDER — FENTANYL CITRATE 0.05 MG/ML IJ SOLN
INTRAMUSCULAR | Status: DC | PRN
  Administered 2011-07-01: 50 ug via INTRAVENOUS
  Administered 2011-07-01: 100 ug via INTRAVENOUS

## 2011-07-01 MED ORDER — LIDOCAINE HCL 1 % IJ SOLN
INTRAMUSCULAR | Status: AC
  Filled 2011-07-01: qty 20

## 2011-07-01 MED ORDER — SODIUM BICARBONATE 4 % IV SOLN
INTRAVENOUS | Status: AC
  Filled 2011-07-01: qty 5

## 2011-07-01 MED ORDER — BUPIVACAINE HCL (PF) 0.5 % IJ SOLN
INTRAMUSCULAR | Status: AC
  Filled 2011-07-01: qty 30

## 2011-07-01 MED ORDER — PROMETHAZINE HCL 25 MG/ML IJ SOLN: 6.2500 mg | INTRAMUSCULAR | Status: DC | PRN

## 2011-07-01 MED ORDER — LIDOCAINE HCL (CARDIAC) 20 MG/ML IV SOLN
INTRAVENOUS | Status: DC | PRN
  Administered 2011-07-01: 70 mg via INTRAVENOUS

## 2011-07-01 MED ORDER — ONDANSETRON HCL 4 MG/2ML IJ SOLN
INTRAMUSCULAR | Status: DC | PRN
  Administered 2011-07-01: 4 mg via INTRAVENOUS

## 2011-07-01 MED ORDER — LIDOCAINE-EPINEPHRINE 1 %-1:100000 IJ SOLN
INTRAMUSCULAR | Status: AC
  Filled 2011-07-01: qty 1

## 2011-07-01 MED ORDER — KCL IN DEXTROSE-NACL 20-5-0.9 MEQ/L-%-% IV SOLN
INTRAVENOUS | Status: DC
  Administered 2011-07-01: 75 mL/h via INTRAVENOUS
  Filled 2011-07-01 (×3): qty 1000

## 2011-07-01 MED ORDER — BUPIVACAINE HCL 0.5 % IJ SOLN
INTRAMUSCULAR | Status: DC | PRN
  Administered 2011-07-01: 10 mL

## 2011-07-01 MED ORDER — SODIUM BICARBONATE 4 % IV SOLN: INTRAVENOUS | Status: DC | PRN

## 2011-07-01 MED ORDER — LACTATED RINGERS IV SOLN
INTRAVENOUS | Status: DC | PRN
  Administered 2011-07-01: 11:00:00 via INTRAVENOUS

## 2011-07-01 MED ORDER — MORPHINE SULFATE 2 MG/ML IJ SOLN
INTRAMUSCULAR | Status: AC
  Administered 2011-07-01: 2 mg via INTRAVENOUS
  Filled 2011-07-01: qty 1

## 2011-07-01 MED ORDER — FENTANYL CITRATE 0.05 MG/ML IJ SOLN
25.0000 ug | INTRAMUSCULAR | Status: DC | PRN
  Administered 2011-07-01: 50 ug via INTRAVENOUS

## 2011-07-01 SURGICAL SUPPLY — 36 items
BANDAGE GAUZE ELAST BULKY 4 IN (GAUZE/BANDAGES/DRESSINGS) ×2 IMPLANT
BLADE SURG 15 STRL LF DISP TIS (BLADE) ×1 IMPLANT
BLADE SURG 15 STRL SS (BLADE) ×1
CANISTER SUCTION 2500CC (MISCELLANEOUS) ×2 IMPLANT
CLOTH BEACON ORANGE TIMEOUT ST (SAFETY) ×2 IMPLANT
COVER SURGICAL LIGHT HANDLE (MISCELLANEOUS) ×2 IMPLANT
DECANTER SPIKE VIAL GLASS SM (MISCELLANEOUS) IMPLANT
DRAIN PENROSE 18X1/2 LTX STRL (DRAIN) ×2 IMPLANT
DRAPE LAPAROSCOPIC ABDOMINAL (DRAPES) IMPLANT
DRSG PAD ABDOMINAL 8X10 ST (GAUZE/BANDAGES/DRESSINGS) ×2 IMPLANT
ELECT CAUTERY BLADE 6.4 (BLADE) ×2 IMPLANT
ELECT REM PT RETURN 9FT ADLT (ELECTROSURGICAL) ×6
ELECTRODE REM PT RTRN 9FT ADLT (ELECTROSURGICAL) ×3 IMPLANT
GLOVE BIO SURGEON STRL SZ7.5 (GLOVE) ×2 IMPLANT
GOWN STRL NON-REIN LRG LVL3 (GOWN DISPOSABLE) ×4 IMPLANT
HOVERMATT SINGLE USE (MISCELLANEOUS) ×2 IMPLANT
KIT BASIN OR (CUSTOM PROCEDURE TRAY) ×2 IMPLANT
NEEDLE HYPO 25X1 1.5 SAFETY (NEEDLE) IMPLANT
NS IRRIG 1000ML POUR BTL (IV SOLUTION) ×2 IMPLANT
PENCIL BUTTON HOLSTER BLD 10FT (ELECTRODE) ×2 IMPLANT
PIN SAFETY NICK PLATE  2 MED (MISCELLANEOUS) ×1
PIN SAFETY NICK PLATE 2 MED (MISCELLANEOUS) ×1 IMPLANT
SPONGE GAUZE 4X4 12PLY (GAUZE/BANDAGES/DRESSINGS) IMPLANT
SPONGE LAP 18X18 X RAY DECT (DISPOSABLE) ×2 IMPLANT
SUT ETHILON 4 0 PS 2 18 (SUTURE) ×2 IMPLANT
SUT MNCRL AB 4-0 PS2 18 (SUTURE) IMPLANT
SUT VIC AB 3-0 SH 27 (SUTURE)
SUT VIC AB 3-0 SH 27XBRD (SUTURE) IMPLANT
SWAB COLLECTION DEVICE MRSA (MISCELLANEOUS) IMPLANT
SYR BULB 3OZ (MISCELLANEOUS) ×2 IMPLANT
SYR CONTROL 10ML LL (SYRINGE) IMPLANT
TAPE CLOTH SURG 4X10 WHT LF (GAUZE/BANDAGES/DRESSINGS) ×2 IMPLANT
TOWEL OR 17X26 10 PK STRL BLUE (TOWEL DISPOSABLE) ×2 IMPLANT
TUBE ANAEROBIC SPECIMEN COL (MISCELLANEOUS) IMPLANT
WATER STERILE IRR 1000ML POUR (IV SOLUTION) IMPLANT
YANKAUER SUCT BULB TIP NO VENT (SUCTIONS) ×2 IMPLANT

## 2011-07-01 NOTE — Anesthesia Postprocedure Evaluation (Signed)
  Anesthesia Post-op Note  Patient: Anita Booker  Procedure(s) Performed: Procedure(s) (LRB): INCISION AND DRAINAGE ABSCESS (Right) OPERATIVE ULTRASOUND (Right)  Patient Location: PACU  Anesthesia Type: General  Level of Consciousness: awake and alert   Airway and Oxygen Therapy: Patient Spontanous Breathing  Post-op Pain: mild  Post-op Assessment: Post-op Vital signs reviewed, Patient's Cardiovascular Status Stable, Respiratory Function Stable, Patent Airway and No signs of Nausea or vomiting  Post-op Vital Signs: stable  Complications: No apparent anesthesia complications. [name] reports she was very scared waking up from anesthesia. I explained to her that we had to keep the endotracheal tube in a few seconds longer to make sure she was awake enough and breathing well enough for extubation. She states understanding.

## 2011-07-01 NOTE — Op Note (Signed)
Operative Note  Anita Booker female 51 y.o. 07/01/2011  PREOPERATIVE DX:  Right Breast Abscess  POSTOPERATIVE DX:  Same  PROCEDURE:   Right breast US.  Complex incision and drainage of right breast abscess.  Right breast biopsy.         Surgeon: Adolph Pollack   Assistants: None  Anesthesia: General endotracheal anesthesia  Indications: This is a 51 year old female with right mastitis and an abscess that worsened despite outpatient oral antibiotics.  She now presents for the above procedure.    Procedure Detail:  She was seen in the holding area and the right breast marked with my initials.  An [place] was performed of the right breast and a fluid collection was seen in the deep subcutaneous tissue at the 9:00 position.  She was then brought to the operating room, placed supine on the operating table and a general anesthetic was given.  The right breast was sterilely prepped and draped.   Using a 19-gauge needle the subcutaneous tissue in this area was aspirated and purulent material was returned. An incision was made in the lateral area of the breast and carried down to the subcutaneous tissues until a pocket of purulent occur was identified. Cultures were sent. This tracked underneath the nipple. Using suction I evacuated the abscess.  Using blunt dissection I broke up loculations and drained further smaller abscess cavities. I then biopsied the breast tissue and sent this to pathology. No other loculated areas were identified.   A half-inch Penrose drain was then placed deep into the wound with the tip being just deep to the nipple areolar complex. A safety pin was sewn to the skin and this was placed to the drain.  Marcaine solution was infiltrated around the wound for local anesthetic effect. A bulky dry dressing was then applied.  She tolerated the procedure well without any apparent complications and was taken to the recovery room in satisfactory condition.   Estimated  Blood Loss:  less than 100 mL         Drains: none          Blood Given: none          Specimens: Abscess fluid to microbiology. Right breast tissue to pathology.        Complications:  * No complications entered in OR log *         Disposition: PACU - hemodynamically stable.         Condition: stable

## 2011-07-01 NOTE — Anesthesia Postprocedure Evaluation (Signed)
  Anesthesia Post-op Note  Patient: Anita Booker  Procedure(s) Performed: Procedure(s) (LRB): INCISION AND DRAINAGE ABSCESS (Right) OPERATIVE ULTRASOUND (Right)  Patient Location: PACU  Anesthesia Type: General  Level of Consciousness: awake and alert   Airway and Oxygen Therapy: Patient Spontanous Breathing and Patient connected to nasal cannula oxygen  Post-op Pain: none  Post-op Assessment: Post-op Vital signs reviewed and Patient's Cardiovascular Status Stable  Post-op Vital Signs: Reviewed and stable  Complications: No apparent anesthesia complications

## 2011-07-01 NOTE — Transfer of Care (Signed)
Immediate Anesthesia Transfer of Care Note  Patient: Anita Booker  Procedure(s) Performed: Procedure(s) (LRB): INCISION AND DRAINAGE ABSCESS (Right) OPERATIVE ULTRASOUND (Right)  Patient Location: PACU  Anesthesia Type: General  Level of Consciousness: awake and alert   Airway & Oxygen Therapy: Patient Spontanous Breathing and Patient connected to face mask oxygen  Post-op Assessment: Report given to PACU RN and Post -op Vital signs reviewed and stable  Post vital signs: Reviewed and stable  Complications: No apparent anesthesia complications

## 2011-07-01 NOTE — Progress Notes (Signed)
  Subjective: No change.  Objective: Vital signs in last 24 hours: Temp:  [98.5 F (36.9 C)-98.8 F (37.1 C)] 98.5 F (36.9 C) (04/13 0600) Pulse Rate:  [72-89] 77  (04/13 0600) Resp:  [18-20] 20  (04/13 0600) BP: (120-140)/(65-75) 120/65 mmHg (04/13 0600) SpO2:  [91 %-98 %] 97 % (04/13 0600) Weight:  [314 lb 12.8 oz (142.792 kg)] 314 lb 12.8 oz (142.792 kg) (04/13 0100) Last BM Date: 07/01/11  Intake/Output from previous day: 04/12 0701 - 04/13 0700 In: -  Out: 400 [Urine:400] Intake/Output this shift:    PE: Right breast-no change in erythema, induration, and potential fluctuant area Lab Results:   [facility] 06/30/11 2102  WBC 19.2*  HGB 13.2  HCT 40.3  PLT 297   BMET  Basename 06/30/11 2102  NA 138  K 3.4*  CL 98  CO2 29  GLUCOSE 96  BUN 8  CREATININE 0.73  CALCIUM 9.4   PT/INR No results found for this basename: LABPROT:2,INR:2 in the last 72 hours Comprehensive Metabolic Panel:    Component Value Date/Time   NA 138 06/30/2011 2102   K 3.4* 06/30/2011 2102   CL 98 06/30/2011 2102   CO2 29 06/30/2011 2102   BUN 8 06/30/2011 2102   CREATININE 0.73 06/30/2011 2102   GLUCOSE 96 06/30/2011 2102   CALCIUM 9.4 06/30/2011 2102   AST 19 04/11/2011 0849   ALT 21 04/11/2011 0849   ALKPHOS 70 04/11/2011 0849   BILITOT 0.9 04/11/2011 0849   PROT 6.8 04/11/2011 0849   ALBUMIN 3.8 04/11/2011 0849     Studies/Results: Dg Chest 2 View  07/01/2011  *RADIOLOGY REPORT*  Clinical Data: Right to mastitis/breast abscess  CHEST - 2 VIEW  Comparison: 03/08/2009  Findings: Chronic interstitial markings. No pleural effusion or pneumothorax.  Cardiomediastinal silhouette is within normal limits.  Degenerative changes of the visualized thoracolumbar spine.  IMPRESSION: No evidence of acute cardiopulmonary disease.  Original Report Authenticated By: Charline Bills, M.D.    Anti-infectives: Anti-infectives     Start     Dose/Rate Route Frequency Ordered Stop   07/01/11 1100    vancomycin (VANCOCIN) 1,500 mg in sodium chloride 0.9 % 500 mL IVPB        1,500 mg 250 mL/hr over 120 Minutes Intravenous Every 12 hours 06/30/11 2343     07/01/11 0700  piperacillin-tazobactam (ZOSYN) IVPB 3.375 g       3.375 g 12.5 mL/hr over 240 Minutes Intravenous Every 8 hours 06/30/11 2319     06/30/11 2345   vancomycin (VANCOCIN) 500 mg in sodium chloride 0.9 % 100 mL IVPB        500 mg 100 mL/hr over 60 Minutes Intravenous  Once 06/30/11 2342 07/01/11 0213   06/30/11 2245   vancomycin (VANCOCIN) IVPB 1000 mg/200 mL premix        1,000 mg 200 mL/hr over 60 Minutes Intravenous  Once 06/30/11 2222 06/30/11 2351   06/30/11 2245  piperacillin-tazobactam (ZOSYN) IVPB 3.375 g       3.375 g 12.5 mL/hr over 240 Minutes Intravenous  Once 06/30/11 2222 07/01/11 0411          Assessment Principal Problem:  *Mastitis, right, acute with abscess    LOS: 1 day   Plan: Will proceed to OR later today for drainage.   Jimma Ortman J 07/01/2011

## 2011-07-01 NOTE — Anesthesia Preprocedure Evaluation (Addendum)
Anesthesia Evaluation  Patient identified by MRN, date of birth, ID band Patient awake    Reviewed: Allergy & Precautions, H&P , NPO status , Patient's Chart, lab work & pertinent test results  Airway Mallampati: III TM Distance: >3 FB Neck ROM: Full    Dental No notable dental hx.    Pulmonary neg pulmonary ROS,  breath sounds clear to auscultation  Pulmonary exam normal       Cardiovascular Exercise Tolerance: Good hypertension, Pt. on medications Rhythm:Regular Rate:Normal  CXR and ECG reviewed.   Neuro/Psych negative neurological ROS  negative psych ROS   GI/Hepatic negative GI ROS, Neg liver ROS,   Endo/Other  Morbid obesity  Renal/GU negative Renal ROS  negative genitourinary   Musculoskeletal negative musculoskeletal ROS (+)   Abdominal   Peds negative pediatric ROS (+)  Hematology negative hematology ROS (+)   Anesthesia Other Findings   Reproductive/Obstetrics negative OB ROS                          Anesthesia Physical Anesthesia Plan  ASA: III  Anesthesia Plan: General   Post-op Pain Management:    Induction: Intravenous  Airway Management Planned: Oral ETT  Additional Equipment:   Intra-op Plan:   Post-operative Plan: Extubation in OR  Informed Consent: I have reviewed the patients History and Physical, chart, labs and discussed the procedure including the risks, benefits and alternatives for the proposed anesthesia with the patient or authorized representative who has indicated his/her understanding and acceptance.   Dental advisory given  Plan Discussed with: CRNA  Anesthesia Plan Comments: (Half-sandwich at 0230.)      Anesthesia Quick Evaluation

## 2011-07-02 MED ORDER — ZOLPIDEM TARTRATE 5 MG PO TABS
5.0000 mg | ORAL_TABLET | Freq: Every evening | ORAL | Status: DC | PRN
  Administered 2011-07-02: 5 mg via ORAL
  Filled 2011-07-02: qty 1

## 2011-07-02 NOTE — Plan of Care (Signed)
Problem: Phase I Progression Outcomes Goal: Tubes/drains patent Outcome: Completed/Met Date Met:  07/01/11 Penrose drain at incisional site

## 2011-07-02 NOTE — Progress Notes (Signed)
1 Day Post-Op  Subjective: Less pain  Objective: Vital signs in last 24 hours: Temp:  [97.9 F (36.6 C)-98.9 F (37.2 C)] 98.1 F (36.7 C) (04/14 0530) Pulse Rate:  [73-85] 83  (04/14 0530) Resp:  [15-18] 18  (04/14 0530) BP: (102-146)/(61-84) 102/64 mmHg (04/14 0530) SpO2:  [92 %-100 %] 94 % (04/14 0530) Last BM Date: 06/29/11  Intake/Output from previous day: 04/13 0701 - 04/14 0700 In: 2514.8 [P.O.:300; I.V.:2013.8; IV Piggyback:200] Out: 2450 [Urine:2450] Intake/Output this shift:    PE: Right breast-decreased cellulitis, moderate serosanguinous drainage, penrose drain in place  Lab Results:   Basename 06/30/11 2102  WBC 19.2*  HGB 13.2  HCT 40.3  PLT 297   BMET  Basename 06/30/11 2102  NA 138  K 3.4*  CL 98  CO2 29  GLUCOSE 96  BUN 8  CREATININE 0.73  CALCIUM 9.4   PT/INR No results found for this basename: LABPROT:2,INR:2 in the last 72 hours Comprehensive Metabolic Panel:    Component Value Date/Time   NA 138 06/30/2011 2102   K 3.4* 06/30/2011 2102   CL 98 06/30/2011 2102   CO2 29 06/30/2011 2102   BUN 8 06/30/2011 2102   CREATININE 0.73 06/30/2011 2102   GLUCOSE 96 06/30/2011 2102   CALCIUM 9.4 06/30/2011 2102   AST 19 04/11/2011 0849   ALT 21 04/11/2011 0849   ALKPHOS 70 04/11/2011 0849   BILITOT 0.9 04/11/2011 0849   PROT 6.8 04/11/2011 0849   ALBUMIN 3.8 04/11/2011 0849     Studies/Results: Dg Chest 2 View  07/01/2011  *RADIOLOGY REPORT*  Clinical Data: Right to mastitis/breast abscess  CHEST - 2 VIEW  Comparison: 03/08/2009  Findings: Chronic interstitial markings. No pleural effusion or pneumothorax.  Cardiomediastinal silhouette is within normal limits.  Degenerative changes of the visualized thoracolumbar spine.  IMPRESSION: No evidence of acute cardiopulmonary disease.  Original Report Authenticated By: Charline Bills, M.D.    Anti-infectives: Anti-infectives     Start     Dose/Rate Route Frequency Ordered Stop   07/01/11 1100    vancomycin (VANCOCIN) 1,500 mg in sodium chloride 0.9 % 500 mL IVPB        1,500 mg 250 mL/hr over 120 Minutes Intravenous Every 12 hours 06/30/11 2343     07/01/11 0700   piperacillin-tazobactam (ZOSYN) IVPB 3.375 g        3.375 g 12.5 mL/hr over 240 Minutes Intravenous Every 8 hours 06/30/11 2319     06/30/11 2345   vancomycin (VANCOCIN) 500 mg in sodium chloride 0.9 % 100 mL IVPB        500 mg 100 mL/hr over 60 Minutes Intravenous  Once 06/30/11 2342 07/01/11 0213   06/30/11 2245   vancomycin (VANCOCIN) IVPB 1000 mg/200 mL premix        1,000 mg 200 mL/hr over 60 Minutes Intravenous  Once 06/30/11 2222 06/30/11 2351   06/30/11 2245   piperacillin-tazobactam (ZOSYN) IVPB 3.375 g        3.375 g 12.5 mL/hr over 240 Minutes Intravenous  Once 06/30/11 2222 07/01/11 0411          Assessment Principal Problem:  *Mastitis, right, acute s/p I & D with biopsy 07/01/11-culture is pending; pain and cellulitis improved    LOS: 2 days   Plan: Continue IV abxs.  Wait for culture results.   Ifeanyichukwu Wickham J 07/02/2011

## 2011-07-02 NOTE — ED Provider Notes (Signed)
Medical screening examination/treatment/procedure(s) were conducted as a shared visit with non-physician practitioner(s) and myself.  I personally evaluated the patient during the encounter Pt with progressive right breast erythema, pain and swelling. Firm, tender, large breast mass, suspect probable abscess. gen surgery called - will admit.   Suzi Roots, MD 07/02/11 [phone]

## 2011-07-03 MED ORDER — DOXYCYCLINE HYCLATE 100 MG PO TABS
100.0000 mg | ORAL_TABLET | Freq: Two times a day (BID) | ORAL | Status: DC
  Administered 2011-07-03: 100 mg via ORAL
  Filled 2011-07-03 (×2): qty 1

## 2011-07-03 MED ORDER — DOXYCYCLINE HYCLATE 100 MG PO TABS: 100.0000 mg | ORAL_TABLET | Freq: Two times a day (BID) | ORAL | Status: DC

## 2011-07-03 MED ORDER — OXYCODONE HCL 5 MG PO TABS: 5.0000 mg | ORAL_TABLET | ORAL | Status: AC | PRN

## 2011-07-03 NOTE — Progress Notes (Signed)
[facility] - Attending  Patient seen and examined.  Family at bedside.  Patient adamant about discharge home today.  Will prescribe doxycycline.  Cultures show staph, not MRSA.  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  Will arrange follow up with Dr. Abbey Chatters next week at [facility] office.  Velora Heckler, MD, [facility] Office: [phone]

## 2011-07-03 NOTE — Discharge Summary (Signed)
Physician Discharge Summary  Patient ID: Anita Booker MRN: [id number] DOB/AGE: 06-14-1960 51 y.o.  Admit date: 06/30/2011 Discharge date: 07/03/2011  Admission Diagnoses: Acute right mastitis with probable abscess   Discharge Diagnoses:  Principal Problem:  *Mastitis, right, acute s/p I & D with biopsy 07/01/11   PROCEDURES: Right breast US. Complex incision and drainage of right breast abscess. Right breast biopsy. 07/01/11 Dr. Abbey Chatters hypertension  hyperlipidemia  Metabolic syndrome BMI 59   Hospital Course: 51 year old female who noted redness in her right breast about 3 days ago. She saw her primary care physician a couple of days ago and was given intra-muscular Rocephin and started on Bactrim. She went back to her primary care physician today because the situation became worse despite oral antibiotic treatment. She reports having fevers as well as headaches. She also said some nausea and vomiting. She denies any trauma to the breast. She denies having a previous infection like this. She gets mammograms every 6 months.  Pt. Was admitted by Dr. Abbey Chatters and taken to the OR with the above noted procedure.  Staph Aureus  was grown from culture.  She continues to have some purulent drainage from the site.  A Penrose drain is in place and to be advanced about 1 CM on a daily basis.  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 She is being placed On doxycycline  100mg  BID for 14 days, and follow up with DR. Rosenbower in 1 week.  Condition on D/C:  Improving.   Disposition:   Discharge Orders    Future Appointments: Provider: Department: Dept Phone: Center:   07/28/2011 10:00 AM [facility] [phone] [facility]   08/11/2011 9:00 AM Hart Carwin, MD [facility] [phone] [facility]     Medication List  As of 07/03/2011 12:34 PM   STOP taking these medications         sulfamethoxazole-trimethoprim 800-160 MG per  tablet         TAKE these medications         acetaminophen 500 MG tablet   Commonly known as: TYLENOL   Take 500 mg by mouth every 6 (six) hours as needed. For pain relief      aspirin 81 MG tablet   Take 81 mg by mouth daily.      atorvastatin 40 MG tablet   Commonly known as: LIPITOR   Take 1 tablet (40 mg total) by mouth daily.      celecoxib 200 MG capsule   Commonly known as: CELEBREX   Take 200 mg by mouth daily.      doxycycline 100 MG tablet   Commonly known as: VIBRA-TABS   Take 1 tablet (100 mg total) by mouth every 12 (twelve) hours.      furosemide 20 MG tablet   Commonly known as: LASIX   Take 1 tablet (20 mg total) by mouth daily.      oxyCODONE 5 MG immediate release tablet   Commonly known as: Oxy IR/ROXICODONE   Take 1 tablet (5 mg total) by mouth every 4 (four) hours as needed.      valsartan 160 MG tablet   Commonly known as: DIOVAN   Take 1 tablet (160 mg total) by mouth daily.           Follow-up Information    Follow up with ROSENBOWER,TODD J, MD. Schedule an appointment as soon as possible for a visit in 1 week.  Contact information:   [employer] [address] [place] [address] [phone]       Follow up with Clean the breast site with soap and water 2-3 times a  day.  More if needed.  Redress with dry sterile dressing.  You can advance the penrose drain 1 cm daily..         SignedSherrie George 07/03/2011, 12:34 PM

## 2011-07-03 NOTE — Progress Notes (Signed)
2 Days Post-Op  Subjective: Feels better still sore.  Want to go home, no siginificant pain, just very sore. Afebrile, VSS, BP moves around some.  Staph Aureus from culture, No other labs currently Objective: Vital signs in last 24 hours: Temp:  [97.8 F (36.6 C)-98.2 F (36.8 C)] 97.8 F (36.6 C) (04/15 0526) Pulse Rate:  [70-86] 86  (04/15 0526) Resp:  [20] 20  (04/15 0526) BP: (99-151)/(65-84) 99/65 mmHg (04/15 0526) SpO2:  [92 %-96 %] 95 % (04/15 0526) Last BM Date: 07/02/11  Intake/Output from previous day: 04/14 0701 - 04/15 0700 In: 1060 [P.O.:960; IV Piggyback:100] Out: 1500 [Urine:1500] Intake/Output this shift:    General appearance: alert, cooperative and no distress Open area is erythematous and still has swelling, +purulent drainage on dressing. Lab Results:   [facility] 06/30/11 2102  WBC 19.2*  HGB 13.2  HCT 40.3  PLT 297    BMET  Basename 06/30/11 2102  NA 138  K 3.4*  CL 98  CO2 29  GLUCOSE 96  BUN 8  CREATININE 0.73  CALCIUM 9.4   PT/INR No results found for this basename: LABPROT:2,INR:2 in the last 72 hours  No results found for this basename: AST:5,ALT:5,ALKPHOS:5,BILITOT:5,PROT:5,ALBUMIN:5 in the last 168 hours   Lipase  No results found for this basename: lipase     Studies/Results: No results found.  Medications:    . furosemide  20 mg Oral Daily  . irbesartan  150 mg Oral Daily  . piperacillin-tazobactam (ZOSYN)  IV  3.375 g Intravenous Q8H  . vancomycin  1,500 mg Intravenous Q12H    Assessment/Plan Right Breast Abscess; Right breast US. Complex incision and drainage of right breast abscess. Right breast biopsy 07/01/11 Dr. Abbey Chatters. Culture + Staph Aurenu hypertension hyperlipidemia  .  Dysmetabolic syndrome X  BMI 59   Plan: Continue antibiotics, slowly advance drain.  Pt wants to go home.  Path is still pending. On zosyn and Vanc.  Will discuss changing to Augmentin.   LOS: 3 days     Anita Booker 07/03/2011

## 2011-07-03 NOTE — Progress Notes (Signed)
UR complete 

## 2011-07-03 NOTE — Discharge Instructions (Signed)
Mastitis  Mastitis is a breast infection that is results in pain, puffiness (swelling), redness, and warmth of the breast. Germs cause mastitis and can enter the skin through:  Breastfeeding.   Nipple piercing.   Cracks in the skin of the breast.  HOME CARE  Take all medicine as told by your doctor. An antibiotic medicine to kill the infection may be prescribed.   Keep your nipples clean and dry if you breastfeed. You may have you stop breastfeeding until the breast infection has gone away.   Do not use one breast to nurse your baby. Switch breasts when you breastfeed. Use different positions to breastfeed.   Avoid letting your breasts get overly filled with milk (engorged). Use a breast pump to empty your breasts.   Do not wear tight-fitting bras. Wear a good support bra.   A breastfeeding specialist (lactation consultant) can give you helpful tips on breastfeeding.  GET HELP RIGHT AWAY IF:  Your breast starts leaking a yellow or tan fluid.   The pain, puffiness, or redness in your breast gets worse.   You have a fever.  MAKE SURE YOU:   Understand these instructions.   Will watch your condition.  Abscess An abscess (boil or furuncle) is an infected area that contains a collection of pus.  SYMPTOMS Signs and symptoms of an abscess include pain, tenderness, redness, or hardness. You may feel a moveable soft area under your skin. An abscess can occur anywhere in the body.  TREATMENT  A surgical cut (incision) may be made over your abscess to drain the pus. Gauze may be packed into the space or a drain may be looped through the abscess cavity (pocket). This provides a drain that will allow the cavity to heal from the inside outwards. The abscess may be painful for a few days, but should feel much better if it was drained.  Your abscess, if seen early, may not have localized and may not have been drained. If not, another appointment may be required if it does not get better on its  own or with medications. HOME CARE INSTRUCTIONS   Only take over-the-counter or prescription medicines for pain, discomfort, or fever as directed by your caregiver.   Take your antibiotics as directed if they were prescribed. Finish them even if you start to feel better.   Keep the skin and clothes clean around your abscess.   If the abscess was drained, you will need to use gauze dressing to collect any draining pus. Dressings will typically need to be changed 3 or more times a day.   The infection may spread by skin contact with others. Avoid skin contact as much as possible.   Practice good hygiene. This includes regular hand washing, cover any draining skin lesions, and do not share personal care items.   If you participate in sports, do not share athletic equipment, towels, whirlpools, or personal care items. Shower after every practice or tournament.   If a draining area cannot be adequately covered:   Do not participate in sports.   Children should not participate in day care until the wound has healed or drainage stops.   If your caregiver has given you a follow-up appointment, it is very important to keep that appointment. Not keeping the appointment could result in a much worse infection, chronic or permanent injury, pain, and disability. If there is any problem keeping the appointment, you must call back to this facility for assistance.  SEEK MEDICAL CARE IF:  You develop increased pain, swelling, redness, drainage, or bleeding in the wound site.   You develop signs of generalized infection including muscle aches, chills, fever, or a general ill feeling.   You have an oral temperature above 102 F (38.9 C).  MAKE SURE YOU:   Understand these instructions.   Will watch your condition.   Will get help right away if you are not doing well or get worse.  Document Released: 12/14/2004 Document Revised: 02/23/2011 Document Reviewed: 10/08/2007  [facility] Patient  Information 2012 [place], [place].Will get help right away if you are not doing well or get worse.  Document Released: 02/22/2009 Document Revised: 02/23/2011 Document Reviewed: 02/22/2009 [facility] Patient Information 2012 [place], [place].

## 2011-07-03 NOTE — Progress Notes (Signed)
Patient discharged to home with husband and daughter at bedside.  Denies pain.  Vitals stable.  Dressing on R breast CDI.  Discussed discharge instructions, verbalized understanding.  No other concerns at this time.  Patient has all belongings.    Anita Booker 07/03/2011 1430

## 2011-07-03 NOTE — Discharge Summary (Signed)
General Surgery [facility] - Attending  Patient discharged home on oral abx's at her request.  Will follow up in [facility] office next week.  Wound care instructions given.  Velora Heckler, MD, [facility] Office: [phone]

## 2011-07-04 LAB — CULTURE, ROUTINE-ABSCESS

## 2011-07-06 LAB — ANAEROBIC CULTURE

## 2011-07-07 ENCOUNTER — Ambulatory Visit (INDEPENDENT_AMBULATORY_CARE_PROVIDER_SITE_OTHER): Payer: BC Managed Care – PPO | Admitting: General Surgery

## 2011-07-07 ENCOUNTER — Encounter (INDEPENDENT_AMBULATORY_CARE_PROVIDER_SITE_OTHER): Payer: Self-pay | Admitting: General Surgery

## 2011-07-07 VITALS — BP 123/84 | HR 104 | Temp 97.7°F | Ht 61.0 in | Wt 313.6 lb

## 2011-07-07 DIAGNOSIS — Z9889 Other specified postprocedural states: Secondary | ICD-10-CM

## 2011-07-07 MED ORDER — FLUCONAZOLE 100 MG PO TABS: 100.0000 mg | ORAL_TABLET | Freq: Every day | ORAL | Status: AC

## 2011-07-07 NOTE — Patient Instructions (Signed)
Pull drain back one centimeter every other day.  Take Diflucan as directed.

## 2011-07-07 NOTE — Progress Notes (Addendum)
Operation:  Incision and drainage of large right breast abscess  Date: June 30, 2028  Pathology: Staphylococcus aureus and abscess.  Biopsy benign  HPI: She is here for a postoperative visit. She's been pulling her drain back a little bit everyday. She is taking doxycycline and food does not taste good. She has a yeast rash.   Physical Exam: Right breast-there is a significant decrease in the erythema and induration. There is a lateral incision with a Penrose drain in place. There is a small amount of nonpurulent drainage.   Assessment:  Right breast wound is clean and saline this is responding nicely to the drainage and antibiotics. Has a yeast infection secondary to the antibiotics.  Plan:  Diflucan for 2 days. Pull the drain back every other day. Return visit 5 days.

## 2011-07-12 ENCOUNTER — Ambulatory Visit (INDEPENDENT_AMBULATORY_CARE_PROVIDER_SITE_OTHER): Payer: BC Managed Care – PPO | Admitting: General Surgery

## 2011-07-12 ENCOUNTER — Encounter (INDEPENDENT_AMBULATORY_CARE_PROVIDER_SITE_OTHER): Payer: Self-pay | Admitting: General Surgery

## 2011-07-12 ENCOUNTER — Encounter (INDEPENDENT_AMBULATORY_CARE_PROVIDER_SITE_OTHER): Payer: Self-pay

## 2011-07-12 VITALS — BP 158/96 | HR 88 | Temp 97.6°F | Resp 20 | Ht 61.0 in | Wt 311.8 lb

## 2011-07-12 DIAGNOSIS — Z9889 Other specified postprocedural states: Secondary | ICD-10-CM

## 2011-07-12 MED ORDER — DOXYCYCLINE HYCLATE 100 MG PO TABS: 100.0000 mg | ORAL_TABLET | Freq: Two times a day (BID) | ORAL | Status: AC

## 2011-07-12 NOTE — Patient Instructions (Signed)
Saline damp to dry dressing change as directed daily.  You may go back to work.

## 2011-07-12 NOTE — Progress Notes (Addendum)
Operation:  Incision and drainage of large right breast abscess  Date: June 30, 2028  Pathology: Staphylococcus aureus and abscess.  Biopsy benign  HPI: She is here for another postoperative visit.  The drain fell out two days ago.  Physical Exam: Right breast-mild pinkness around wound; wound depth is about 4 cm; wound was packed with saline moistened gauze followed by a dry dressing.  Assessment:  Status post I & D of deep right breast abscess- drain is out.  Plan: Saline damp to dry dressing change daily.  Continue abxs.  RTC one week.

## 2011-07-19 ENCOUNTER — Encounter (INDEPENDENT_AMBULATORY_CARE_PROVIDER_SITE_OTHER): Payer: Self-pay | Admitting: General Surgery

## 2011-07-19 ENCOUNTER — Ambulatory Visit (INDEPENDENT_AMBULATORY_CARE_PROVIDER_SITE_OTHER): Payer: BC Managed Care – PPO | Admitting: General Surgery

## 2011-07-19 VITALS — BP 140/92 | HR 74 | Temp 97.5°F | Resp 14 | Ht 61.0 in | Wt 313.4 lb

## 2011-07-19 DIAGNOSIS — Z9889 Other specified postprocedural states: Secondary | ICD-10-CM

## 2011-07-19 NOTE — Patient Instructions (Signed)
Continue current dressing changes

## 2011-07-19 NOTE — Progress Notes (Signed)
She is here for another postoperative visit. She's been packing the right breast wound with damp gauze a light dry dressing daily. She has no complaints.  Exam: She is afebrile.  Right breast-there is . 4 cm deep wound in the right lateral aspect with no surrounding erythema. I repacked this with saline moistened gauze followed by dry dressing.  Assessment: Complex, deep right breast abscess that is healing in by secondary intention.  Plan: Continue current wound care. Return visit 3 weeks. We did discuss the fact that this could recur and she may need further surgery.

## 2011-07-28 ENCOUNTER — Telehealth (INDEPENDENT_AMBULATORY_CARE_PROVIDER_SITE_OTHER): Payer: Self-pay | Admitting: General Surgery

## 2011-07-28 MED ORDER — DOXYCYCLINE HYCLATE 100 MG PO TABS: 100.0000 mg | ORAL_TABLET | Freq: Two times a day (BID) | ORAL | Status: DC

## 2011-07-28 NOTE — Telephone Encounter (Signed)
Dr Abbey Chatters advised to refill doxycycline. Refill called to pharmacy. Patient made aware.

## 2011-07-28 NOTE — Telephone Encounter (Signed)
Addended byLiliana Cline on: 07/28/2011 10:42 AM   Modules accepted: Orders

## 2011-07-28 NOTE — Telephone Encounter (Signed)
Patient called in status post complex, deep right breast abscess that is healing in by secondary intention. She is on doxycycline but her RX runs out on Monday and she was calling to make sure that is okay and she does not need to get a refill. Please advise and call patient.

## 2011-08-09 ENCOUNTER — Encounter (INDEPENDENT_AMBULATORY_CARE_PROVIDER_SITE_OTHER): Payer: Self-pay | Admitting: General Surgery

## 2011-08-09 ENCOUNTER — Ambulatory Visit (INDEPENDENT_AMBULATORY_CARE_PROVIDER_SITE_OTHER): Payer: BC Managed Care – PPO | Admitting: General Surgery

## 2011-08-09 VITALS — BP 128/89 | HR 87 | Temp 97.4°F | Ht 61.0 in | Wt 318.6 lb

## 2011-08-09 DIAGNOSIS — Z9889 Other specified postprocedural states: Secondary | ICD-10-CM

## 2011-08-09 NOTE — Patient Instructions (Signed)
Continue current dressing changes

## 2011-08-09 NOTE — Progress Notes (Signed)
She is here for another postoperative visit. She continues  packing the right breast wound with damp gauze a light dry dressing daily.  She is on Doxycycline and this will finish up soon.  She has no complaints.  Exam: She is afebrile.  Right breast-there is 2 2 cm cm deep wound in the right lateral aspect with no surrounding erythema. I repacked this with saline moistened gauze followed by dry dressing.  Assessment: Complex, deep right breast abscess that is healing in well by secondary intention.  Plan: Continue current wound care. Return visit one month.  Will not refill Doxycycline.

## 2011-08-11 ENCOUNTER — Encounter: Payer: BC Managed Care – PPO | Admitting: Internal Medicine

## 2011-09-13 ENCOUNTER — Ambulatory Visit (INDEPENDENT_AMBULATORY_CARE_PROVIDER_SITE_OTHER): Payer: BC Managed Care – PPO | Admitting: General Surgery

## 2011-09-13 ENCOUNTER — Encounter (INDEPENDENT_AMBULATORY_CARE_PROVIDER_SITE_OTHER): Payer: Self-pay | Admitting: General Surgery

## 2011-09-13 VITALS — BP 124/88 | HR 96 | Temp 98.2°F | Ht 61.0 in | Wt 319.8 lb

## 2011-09-13 DIAGNOSIS — Z9889 Other specified postprocedural states: Secondary | ICD-10-CM

## 2011-09-13 NOTE — Progress Notes (Signed)
She is here for another visit following complex incision and drainage of a large right breast abscess. She states the wound has healed and she is not having any problems from it.  On examination the right lateral breast wound is healed with minimal indentation. No erythema.  Assessment complex right breast abscess status post incision and drainage-the wound has healed.   Plan return visit p.r.n.

## 2011-09-13 NOTE — Patient Instructions (Signed)
Call us if something like this happens again.

## 2011-10-05 ENCOUNTER — Other Ambulatory Visit: Payer: Self-pay | Admitting: Obstetrics

## 2011-10-05 DIAGNOSIS — Z1231 Encounter for screening mammogram for malignant neoplasm of breast: Secondary | ICD-10-CM

## 2011-10-10 ENCOUNTER — Ambulatory Visit
Admission: RE | Admit: 2011-10-10 | Discharge: 2011-10-10 | Disposition: A | Discharge status: Home / Self Care | Payer: BC Managed Care – PPO | Source: Ambulatory Visit | Attending: Obstetrics | Admitting: Obstetrics

## 2011-10-10 DIAGNOSIS — Z1231 Encounter for screening mammogram for malignant neoplasm of breast: Secondary | ICD-10-CM

## 2011-10-13 ENCOUNTER — Other Ambulatory Visit: Payer: Self-pay

## 2011-10-13 ENCOUNTER — Telehealth: Payer: Self-pay

## 2011-10-13 MED ORDER — ATORVASTATIN CALCIUM 40 MG PO TABS: 40.0000 mg | ORAL_TABLET | Freq: Every day | ORAL | Status: DC

## 2011-10-13 NOTE — Telephone Encounter (Signed)
Pt is on Atorvastatin 40mg  qhs. Mylo Red RN

## 2011-10-31 ENCOUNTER — Ambulatory Visit (INDEPENDENT_AMBULATORY_CARE_PROVIDER_SITE_OTHER): Payer: BC Managed Care – PPO | Admitting: Family Medicine

## 2011-10-31 VITALS — BP 135/86 | Ht 61.0 in | Wt 305.0 lb

## 2011-10-31 DIAGNOSIS — G8929 Other chronic pain: Secondary | ICD-10-CM | POA: Insufficient documentation

## 2011-10-31 DIAGNOSIS — M25561 Pain in right knee: Secondary | ICD-10-CM

## 2011-10-31 DIAGNOSIS — M25562 Pain in left knee: Secondary | ICD-10-CM | POA: Insufficient documentation

## 2011-10-31 DIAGNOSIS — M25569 Pain in unspecified knee: Secondary | ICD-10-CM

## 2011-10-31 HISTORY — DX: Other chronic pain: G89.29

## 2011-10-31 NOTE — Assessment & Plan Note (Signed)
Patient did have injections done today. Patient states that she has sleeves that she wears occasionally at home. Patient will continue the exercises stretches that she does occasionally. Once again encourage her to have weight loss so then surgery could be a potential option to her. Patient though would like to avoid surgery at all costs. Patient does have a prescription for Vicodin that was given to her by Dr. Margaretha Sheffield previously. Patient states that this time that she does not need a refill and will contact Dr. Margaretha Sheffield when needed. Patient knows that she can followup as needed for this knee pain.

## 2011-10-31 NOTE — Progress Notes (Signed)
51 year old female coming in with bilateral knee pain. Patient has a significant past medical history where she seen Dr. Margaretha Sheffield and Saundra Shelling for multiple years. Patient has had multiple steroid injections as well as Synvisc injected into her knees bilaterally on multiple occasions. 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 Patient states that she has significant pain with ambulation seems to get better when she rests. Patient understands that her weight is a contributing factor. Patient denies any locking or catching and does not feel that she is a fall risk. Patient also denies any radiation of the pain or any numbness in extremities. Patient has had surgical consults previously for this problem but do to her obesity she is likely not a surgical candidate.  Past Medical History  Diagnosis Date  . Unspecified essential hypertension   . Other and unspecified hyperlipidemia   . Dysmetabolic syndrome X   . Arthritis   . Abscess of right breast    Past Surgical History  Procedure Date  . Cholecystectomy 1995  . Breast surgery 07/01/2011.    I & D of right breast abscess   Family History  Problem Relation Age of Onset  . Lung cancer Father 21  . Cancer Father     lung  . Heart attack Brother 48  . Cancer Paternal Grandmother     breast   History   Social History  . Marital Status: Married    Spouse Name: N/A    Number of Children: N/A  . Years of Education: N/A   Occupational History  . Not on file.   Social History Main Topics  . Smoking status: Never Smoker   . Smokeless tobacco: Not on file  . Alcohol Use: No  . Drug Use: No  . Sexually Active: Not on file   Other Topics Concern  . Not on file   Social History Narrative  . No narrative on file   Review of systems: 14 system review was done and negative as related to the chief complaint.  To exam Filed  Vitals:   10/31/11 1554  BP: 135/86   general: No apparent distress alert and oriented x3 mood is good affect is normal. Patient does walk with a antalgic gait. Knee exam is bilaterally. Patient does have crepitus with movement. Patient does have a full extension as well as flexion of the knees bilaterally. Patient does have pain on the medial and lateral joint lines bilaterally more on the medial joint line and more on the right knee. No effusion noted. Patient's kneecap does have some crepitus with movement. All ligamentous appear to be intact. Neurovascularly intact distally.  Procedure note After verbal and written consent given pt was prepped with betadine.  1:3 kenalog 40mg /mL to lidocaine 1% with epi used in both knees.  Pt minimal bleeding dressed with band aid, Pt given red flags to look for pt had better pain control immediatly.

## 2011-11-02 ENCOUNTER — Encounter: Payer: Self-pay | Admitting: Sports Medicine

## 2012-04-26 ENCOUNTER — Ambulatory Visit (INDEPENDENT_AMBULATORY_CARE_PROVIDER_SITE_OTHER): Payer: 59 | Admitting: General Surgery

## 2012-04-26 ENCOUNTER — Encounter (INDEPENDENT_AMBULATORY_CARE_PROVIDER_SITE_OTHER): Payer: Self-pay | Admitting: General Surgery

## 2012-04-26 VITALS — BP 134/72 | HR 89 | Temp 97.4°F | Resp 18 | Ht 61.0 in | Wt 311.0 lb

## 2012-04-26 DIAGNOSIS — L03319 Cellulitis of trunk, unspecified: Secondary | ICD-10-CM

## 2012-04-26 DIAGNOSIS — L02219 Cutaneous abscess of trunk, unspecified: Secondary | ICD-10-CM

## 2012-04-26 NOTE — Patient Instructions (Signed)
Remove dressing tomorrow and shower. Change dressing twice a day

## 2012-04-30 ENCOUNTER — Encounter (INDEPENDENT_AMBULATORY_CARE_PROVIDER_SITE_OTHER): Payer: Self-pay | Admitting: General Surgery

## 2012-04-30 ENCOUNTER — Ambulatory Visit (INDEPENDENT_AMBULATORY_CARE_PROVIDER_SITE_OTHER): Payer: 59 | Admitting: General Surgery

## 2012-04-30 VITALS — BP 128/66 | HR 84 | Temp 97.1°F | Resp 16 | Ht 61.0 in | Wt 311.8 lb

## 2012-04-30 DIAGNOSIS — L02219 Cutaneous abscess of trunk, unspecified: Secondary | ICD-10-CM

## 2012-04-30 DIAGNOSIS — L03319 Cellulitis of trunk, unspecified: Secondary | ICD-10-CM

## 2012-04-30 HISTORY — DX: Cutaneous abscess of trunk, unspecified: L02.219

## 2012-04-30 NOTE — Progress Notes (Signed)
Subjective:     Patient ID: Anita Booker, female   DOB: 1960/05/21, 52 y.o.   MRN: [id number]  HPI The patient is a 52 year old white female who is a week status post incision and drainage of an abscess on her right chest wall. The area feels much better for her. There is no drainage. She is unable to pack the wound any more.  Review of Systems  Constitutional: Negative.   HENT: Negative.   Eyes: Negative.   Respiratory: Negative.   Cardiovascular: Negative.   Gastrointestinal: Negative.   Endocrine: Negative.   Genitourinary: Negative.   Musculoskeletal: Negative.   Skin: Negative.   Allergic/Immunologic: Negative.   Neurological: Negative.   Hematological: Negative.   Psychiatric/Behavioral: Negative.        Objective:   Physical Exam  Constitutional: She is oriented to person, place, and time. She appears well-developed and well-nourished.  HENT:  Head: Normocephalic and atraumatic.  Eyes: Conjunctivae and EOM are normal. Pupils are equal, round, and reactive to light.  Neck: Normal range of motion. Neck supple.  Cardiovascular: Normal rate, regular rhythm and normal heart sounds.   Pulmonary/Chest: Effort normal and breath sounds normal.  The wound is clean and shallow. We redressed it today and she tolerated this well.  Abdominal: Soft. Bowel sounds are normal.  Musculoskeletal: Normal range of motion.  Neurological: She is alert and oriented to person, place, and time.  Skin: Skin is warm and dry.  Psychiatric: She has a normal mood and affect. Her behavior is normal.       Assessment:     1 week status post incision and drainage of abscess on the right chest wall     Plan:     At this point I think the area will continue to heal nicely. She will keep the area clean and dry. We will see her back on a when necessary basis.

## 2012-04-30 NOTE — Progress Notes (Signed)
Subjective:     Patient ID: Anita Booker, female   DOB: [birth year], 52 y.o.   MRN: [id number]  HPI The patient is a 52 year old female who has seen Dr. Abbey Chatters in the past for multiple abscesses. She states that she developed a spot on her right back and flank area that started hurting yesterday. There is been red. She denies any fevers or chills.  Review of Systems  Constitutional: Negative.   HENT: Negative.   Eyes: Negative.   Respiratory: Negative.   Cardiovascular: Negative.   Gastrointestinal: Negative.   Endocrine: Negative.   Genitourinary: Negative.   Musculoskeletal: Negative.   Skin: Negative.   Allergic/Immunologic: Negative.   Neurological: Negative.   Hematological: Negative.   Psychiatric/Behavioral: Negative.        Objective:   Physical Exam  Constitutional: She is oriented to person, place, and time. She appears well-developed and well-nourished.  obese  HENT:  Head: Normocephalic and atraumatic.  Eyes: Conjunctivae and EOM are normal. Pupils are equal, round, and reactive to light.  Neck: Normal range of motion. Neck supple.  Cardiovascular: Normal rate, regular rhythm and normal heart sounds.   Pulmonary/Chest: Effort normal and breath sounds normal.  There is a small abscess on her right chest wall. This area was prepped with ChloraPrep, infiltrated with 1% lidocaine, and opened with an 11 blade knife. A moderate amount of purulent material was evacuated. The wound was then packed with gauze and sterile dressings were applied.  Abdominal: Soft. Bowel sounds are normal.  Musculoskeletal: Normal range of motion.  Neurological: She is alert and oriented to person, place, and time.  Skin: Skin is warm and dry.  Psychiatric: She has a normal mood and affect. Her behavior is normal.       Assessment:     The patient has a small abscess on her right chest wall. This was incised and drained at the bedside and she tolerated this well.     Plan:     At this point I would like her to remove the dressing tomorrow and start showering. She can change the dressing twice a day. We will plan to see her back in a week to check the wound.

## 2012-04-30 NOTE — Patient Instructions (Signed)
Continue to keep area clean and dry. 

## 2012-06-03 ENCOUNTER — Ambulatory Visit: Payer: 59 | Admitting: Sports Medicine

## 2012-06-05 ENCOUNTER — Ambulatory Visit (INDEPENDENT_AMBULATORY_CARE_PROVIDER_SITE_OTHER): Payer: 59 | Admitting: Sports Medicine

## 2012-06-05 VITALS — BP 122/86 | Ht 61.0 in | Wt 290.0 lb

## 2012-06-05 DIAGNOSIS — M25562 Pain in left knee: Secondary | ICD-10-CM

## 2012-06-05 DIAGNOSIS — M25561 Pain in right knee: Secondary | ICD-10-CM

## 2012-06-05 DIAGNOSIS — M25569 Pain in unspecified knee: Secondary | ICD-10-CM

## 2012-06-05 DIAGNOSIS — IMO0002 Reserved for concepts with insufficient information to code with codable children: Secondary | ICD-10-CM

## 2012-06-05 DIAGNOSIS — M179 Osteoarthritis of knee, unspecified: Secondary | ICD-10-CM

## 2012-06-05 DIAGNOSIS — M1710 Unilateral primary osteoarthritis, unspecified knee: Secondary | ICD-10-CM

## 2012-06-05 MED ORDER — METHYLPREDNISOLONE ACETATE 40 MG/ML IJ SUSP
40.0000 mg | Freq: Once | INTRAMUSCULAR | Status: AC
  Administered 2012-06-05: 40 mg via INTRA_ARTICULAR

## 2012-06-05 NOTE — Progress Notes (Signed)
  Subjective:    Patient ID: Anita Booker, female    DOB: 1960-09-03, 52 y.o.   MRN: [id number]  HPI  Patient returns to the office today complaining of returning bilateral knee pain, right greater than left. Well-known history of bilateral knee DJD. She had an acute pain in the right knee about a week ago. She is walking with a limp. She was last seen in the office in August when we injected each of her knees with cortisone. She takes Celebrex chronically and hydrocodone when necessary.    Review of Systems     Objective:   Physical Exam Obese, no acute distress  Examination of each of her knees is limited by body habitus. Range of motion 0-110. No obvious effusion. Joints are stable grossly to ligamentous exam. Neurovascularly intact distally. Walking with an obvious limp.       Assessment & Plan:  1. Bilateral knee pain secondary to DJD  I've reinjected each of her knees today. Anterior medial approach was utilized. I want to order updated x-rays of each knee. Continue with Celebrex. Continue with when necessary hydrocodone. I discussed gastric bypass with her but she is not interested. I think her morbid obesity precludes her from total knee arthroplasty but I did talk to her about the possibility of a referral to Dr. Turner Daniels to discuss this further. We will discuss this in more detail after I review her updated x-rays. I've explained to the patient that at some point conservative treatment will fail. She will followup when necessary.  Consent obtained and verified. Time-out conducted. Noted no overlying erythema, induration, or other signs of local infection. Skin prepped in a sterile fashion. Topical analgesic spray: Ethyl chloride. Joint: right knee Needle: 22g 11/2 inch Completed without difficulty. Meds: 3cc 1%lidocaine, 1cc (40mg ) depomedrol  Advised to call if fevers/chills, erythema, induration, drainage, or persistent bleeding.  Consent obtained and  verified. Time-out conducted. Noted no overlying erythema, induration, or other signs of local infection. Skin prepped in a sterile fashion. Topical analgesic spray: Ethyl chloride. Joint: left knee Needle: 22g 11/2 inch Completed without difficulty. Meds: 3cc 1%lidocaine, 1cc (40mg ) depomedrol  Advised to call if fevers/chills, erythema, induration, drainage, or persistent bleeding.

## 2012-06-07 ENCOUNTER — Ambulatory Visit
Admission: RE | Admit: 2012-06-07 | Discharge: 2012-06-07 | Disposition: A | Discharge status: Home / Self Care | Payer: 59 | Source: Ambulatory Visit | Attending: Sports Medicine | Admitting: Sports Medicine

## 2012-06-07 ENCOUNTER — Other Ambulatory Visit: Payer: Self-pay | Admitting: *Deleted

## 2012-06-07 DIAGNOSIS — M25562 Pain in left knee: Secondary | ICD-10-CM

## 2012-06-07 DIAGNOSIS — M25561 Pain in right knee: Secondary | ICD-10-CM

## 2012-06-07 MED ORDER — HYDROCODONE-ACETAMINOPHEN 7.5-325 MG PO TABS: 1.0000 | ORAL_TABLET | Freq: Four times a day (QID) | ORAL | Status: DC | PRN

## 2012-06-11 ENCOUNTER — Telehealth: Payer: Self-pay | Admitting: Sports Medicine

## 2012-06-11 NOTE — Telephone Encounter (Signed)
I spoke with the patient on the phone regarding x-rays of her knees. X-rays confirm end-stage bone-on-bone medial compartmental DJD. She is feeling better after her recent cortisone injections. Definitive treatment is a total knee arthroplasty but her morbid obesity may preclude her from this. We did discuss the possibility of referral to Dr. Turner Daniels if symptoms warrant but she would like to wait on that for now. Her morbid obesity would certainly predispose her to perioperative and postoperative complications. She will followup when necessary.

## 2012-07-25 ENCOUNTER — Other Ambulatory Visit: Payer: Self-pay | Admitting: *Deleted

## 2012-07-25 MED ORDER — CELECOXIB 200 MG PO CAPS: 200.0000 mg | ORAL_CAPSULE | Freq: Every day | ORAL | Status: DC

## 2012-07-30 ENCOUNTER — Other Ambulatory Visit: Payer: Self-pay | Admitting: *Deleted

## 2012-07-30 MED ORDER — CELECOXIB 200 MG PO CAPS: 200.0000 mg | ORAL_CAPSULE | Freq: Every day | ORAL | Status: DC

## 2012-08-06 DIAGNOSIS — H16009 Unspecified corneal ulcer, unspecified eye: Secondary | ICD-10-CM

## 2012-08-06 HISTORY — DX: Unspecified corneal ulcer, unspecified eye: H16.009

## 2012-10-24 ENCOUNTER — Ambulatory Visit (INDEPENDENT_AMBULATORY_CARE_PROVIDER_SITE_OTHER): Payer: 59 | Admitting: Sports Medicine

## 2012-10-24 ENCOUNTER — Encounter: Payer: Self-pay | Admitting: Sports Medicine

## 2012-10-24 DIAGNOSIS — IMO0002 Reserved for concepts with insufficient information to code with codable children: Secondary | ICD-10-CM

## 2012-10-24 DIAGNOSIS — M179 Osteoarthritis of knee, unspecified: Secondary | ICD-10-CM

## 2012-10-24 DIAGNOSIS — M25569 Pain in unspecified knee: Secondary | ICD-10-CM

## 2012-10-24 DIAGNOSIS — M25561 Pain in right knee: Secondary | ICD-10-CM

## 2012-10-24 DIAGNOSIS — M1710 Unilateral primary osteoarthritis, unspecified knee: Secondary | ICD-10-CM

## 2012-10-24 DIAGNOSIS — M25562 Pain in left knee: Secondary | ICD-10-CM

## 2012-10-24 MED ORDER — METHYLPREDNISOLONE ACETATE 40 MG/ML IJ SUSP
40.0000 mg | Freq: Once | INTRAMUSCULAR | Status: AC
  Administered 2012-10-24: 40 mg via INTRA_ARTICULAR

## 2012-10-24 MED ORDER — HYDROCODONE-ACETAMINOPHEN 7.5-325 MG PO TABS
1.0000 | ORAL_TABLET | Freq: Four times a day (QID) | ORAL | Status: DC | PRN
Start: 2012-10-24 — End: 2013-01-28

## 2012-10-24 NOTE — Addendum Note (Signed)
Addended by: Jacki Cones C on: 10/24/2012 09:49 AM   Modules accepted: Orders

## 2012-10-24 NOTE — Progress Notes (Signed)
  Subjective:    Patient ID: Anita Booker, female    DOB: 02/10/1961, 52 y.o.   MRN: [id number]  HPI Patient comes in today for followup on bilateral knee DJD. She is here today for repeat cortisone injections. Knees were last injected in March of this year. Each injection provided her with several months of relief. She is still taking Celebrex daily and hydrocodone as needed. No recent trauma.    Review of Systems     Objective:   Physical Exam Obese, no acute distress. Awake alert and oriented x3  Examination of each knee shows range of motion from 0-110. No obvious effusion. Minimal joint line tenderness with palpation. Negative McMurray's. Knees are grossly stable to ligamentous exam. Neurovascular intact distally. Walking with a limp.  X-rays from 06/07/2012 of each knee are reviewed. Patient has bone-on-bone medial compartmental DJD in each knee.       Assessment & Plan:  1. 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 Obesity  Each of her knees are reinjected today with cortisone. An anterior medial approach was utilized. Patient tolerated this without difficulty. Patient will continue on her Celebrex an hour refill her hydrocodone. Followup in 6 months.  Consent obtained and verified. Time-out conducted. Noted no overlying erythema, induration, or other signs of local infection. Skin prepped in a sterile fashion. Topical analgesic spray: Ethyl chloride. Joint: left knee Needle: 25g 1 1/2 inch Completed without difficulty. Meds: 3cc 1% xylocaine, 1cc (40mg ) depomedrol  Advised to call if fevers/chills, erythema, induration, drainage, or persistent bleeding.  Consent obtained and verified. Time-out conducted. Noted no overlying erythema, induration, or other signs of local infection. Skin prepped in a sterile fashion. Topical analgesic spray: Ethyl chloride. Joint: right knee Needle: 25g 1 1/2 inch Completed without  difficulty. Meds: 3cc 1% xylocaine, 1cc (40mg ) depomderol  Advised to call if fevers/chills, erythema, induration, drainage, or persistent bleeding.

## 2012-10-28 ENCOUNTER — Ambulatory Visit: Payer: 59 | Admitting: Sports Medicine

## 2012-12-06 ENCOUNTER — Other Ambulatory Visit: Payer: Self-pay | Admitting: Cardiology

## 2013-01-28 ENCOUNTER — Other Ambulatory Visit: Payer: Self-pay | Admitting: *Deleted

## 2013-01-28 MED ORDER — HYDROCODONE-ACETAMINOPHEN 7.5-325 MG PO TABS: 1.0000 | ORAL_TABLET | Freq: Four times a day (QID) | ORAL | Status: DC | PRN

## 2013-01-28 MED ORDER — CELECOXIB 200 MG PO CAPS: 200.0000 mg | ORAL_CAPSULE | Freq: Every day | ORAL | Status: DC

## 2013-02-20 ENCOUNTER — Ambulatory Visit (INDEPENDENT_AMBULATORY_CARE_PROVIDER_SITE_OTHER): Payer: 59 | Admitting: Sports Medicine

## 2013-02-20 ENCOUNTER — Encounter (INDEPENDENT_AMBULATORY_CARE_PROVIDER_SITE_OTHER): Payer: Self-pay

## 2013-02-20 VITALS — BP 146/91

## 2013-02-20 DIAGNOSIS — M179 Osteoarthritis of knee, unspecified: Secondary | ICD-10-CM

## 2013-02-20 DIAGNOSIS — M25569 Pain in unspecified knee: Secondary | ICD-10-CM

## 2013-02-20 DIAGNOSIS — M1710 Unilateral primary osteoarthritis, unspecified knee: Secondary | ICD-10-CM

## 2013-02-20 DIAGNOSIS — IMO0002 Reserved for concepts with insufficient information to code with codable children: Secondary | ICD-10-CM

## 2013-02-20 DIAGNOSIS — M25561 Pain in right knee: Secondary | ICD-10-CM

## 2013-02-20 MED ORDER — METHYLPREDNISOLONE ACETATE 40 MG/ML IJ SUSP
40.0000 mg | Freq: Once | INTRAMUSCULAR | Status: AC
  Administered 2013-02-20: 40 mg via INTRA_ARTICULAR

## 2013-02-20 MED ORDER — HYDROCODONE-ACETAMINOPHEN 7.5-325 MG PO TABS: 1.0000 | ORAL_TABLET | Freq: Four times a day (QID) | ORAL | Status: DC | PRN

## 2013-02-21 NOTE — Progress Notes (Signed)
   Subjective:    Patient ID: Anita Booker, female    DOB: [birth year], 52 y.o.   MRN: [id number]  HPI chief complaint: Bilateral knee pain  Patient comes in today requesting repeat bilateral cortisone injections and refill on pain medicine. 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 She does well with periodic cortisone injections and when necessary hydrocodone. No new trauma.    Review of Systems     Objective:   Physical Exam Obese, no acute distress  Examination of each knee shows range of motion from 0-120. 1+ boggy synovitis. Tender to palpation along the medial joint line bilaterally. 1+ patellofemoral crepitus bilaterally. Knees are stable to ligamentous exam. Neurovascularly intact distally.       Assessment & Plan:  Bilateral knee pain secondary to advanced DJD Morbid obesity  Each of her knees were injected today with cortisone. Right knee was injected utilizing an anterior medial approach and the left knee was injected utilizing an anterior lateral approach. Patient tolerated this without difficulty. I've refilled her hydrocodone. She has taken this responsibly for several years now. At this point in time her morbid obesity precludes her from total knee arthroplasty but at some point down the road I'm afraid she is going to be faced with significant disability from her knees. Followup when necessary.  Consent obtained and verified. Time-out conducted. Noted no overlying erythema, induration, or other signs of local infection. Skin prepped in a sterile fashion. Topical analgesic spray: Ethyl chloride. Joint: left knee Needle: 25g 1.5 inch Completed without difficulty. Meds: 3cc 1% xylocaine, 1cc (40mg ) depomedrol  Consent obtained and verified. Time-out conducted. Noted no overlying erythema, induration, or other signs of local infection. Skin prepped in a sterile fashion. Topical analgesic spray: Ethyl chloride. Joint: right  knee Needle: 25g 1.5 inch Completed without difficulty. Meds: 3cc 1% xylocaine, 1cc (40mg ) depomedrol  Advised to call if fevers/chills, erythema, induration, drainage, or persistent bleeding.   Advised to call if fevers/chills, erythema, induration, drainage, or persistent bleeding.

## 2013-03-05 ENCOUNTER — Other Ambulatory Visit: Payer: Self-pay | Admitting: Sports Medicine

## 2013-03-24 ENCOUNTER — Other Ambulatory Visit: Payer: Self-pay | Admitting: Sports Medicine

## 2013-03-24 ENCOUNTER — Other Ambulatory Visit: Payer: Self-pay | Admitting: *Deleted

## 2013-03-24 MED ORDER — CELECOXIB 200 MG PO CAPS: 200.0000 mg | ORAL_CAPSULE | Freq: Every day | ORAL | Status: DC | PRN

## 2013-04-16 ENCOUNTER — Other Ambulatory Visit: Payer: Self-pay

## 2013-04-16 DIAGNOSIS — Z1231 Encounter for screening mammogram for malignant neoplasm of breast: Secondary | ICD-10-CM

## 2013-04-30 ENCOUNTER — Telehealth: Payer: Self-pay | Admitting: *Deleted

## 2013-04-30 MED ORDER — CELECOXIB 200 MG PO CAPS: 200.0000 mg | ORAL_CAPSULE | Freq: Every day | ORAL | Status: DC | PRN

## 2013-04-30 NOTE — Telephone Encounter (Signed)
Message copied by Mora BellmanMARTIN, AMY C on Wed Apr 30, 2013  4:18 PM ------      Message from: Claiborne BillingsELANEY, MARTHA J      Created: Wed Apr 30, 2013  3:45 PM      Regarding: 90 day Refill on Celebrex      Contact: [phone]       [facility]  Would like a 3 month supply of Celebrex ------

## 2013-04-30 NOTE — Telephone Encounter (Signed)
Refilled per Dr. Draper. 

## 2013-05-07 ENCOUNTER — Ambulatory Visit: Payer: 59

## 2013-07-18 ENCOUNTER — Encounter (HOSPITAL_COMMUNITY): Payer: Self-pay | Admitting: Emergency Medicine

## 2013-07-18 ENCOUNTER — Emergency Department (HOSPITAL_COMMUNITY): Payer: No Typology Code available for payment source

## 2013-07-18 ENCOUNTER — Emergency Department (HOSPITAL_COMMUNITY)
Admission: EM | Admit: 2013-07-18 | Discharge: 2013-07-18 | Disposition: A | Discharge status: Home / Self Care | Payer: No Typology Code available for payment source | Attending: Emergency Medicine | Admitting: Emergency Medicine

## 2013-07-18 DIAGNOSIS — S139XXA Sprain of joints and ligaments of unspecified parts of neck, initial encounter: Secondary | ICD-10-CM | POA: Insufficient documentation

## 2013-07-18 DIAGNOSIS — M129 Arthropathy, unspecified: Secondary | ICD-10-CM | POA: Diagnosis not present

## 2013-07-18 DIAGNOSIS — M549 Dorsalgia, unspecified: Secondary | ICD-10-CM

## 2013-07-18 DIAGNOSIS — IMO0002 Reserved for concepts with insufficient information to code with codable children: Secondary | ICD-10-CM | POA: Diagnosis not present

## 2013-07-18 DIAGNOSIS — E785 Hyperlipidemia, unspecified: Secondary | ICD-10-CM | POA: Insufficient documentation

## 2013-07-18 DIAGNOSIS — Y92410 Unspecified street and highway as the place of occurrence of the external cause: Secondary | ICD-10-CM | POA: Insufficient documentation

## 2013-07-18 DIAGNOSIS — Z872 Personal history of diseases of the skin and subcutaneous tissue: Secondary | ICD-10-CM | POA: Insufficient documentation

## 2013-07-18 DIAGNOSIS — S0081XA Abrasion of other part of head, initial encounter: Secondary | ICD-10-CM

## 2013-07-18 DIAGNOSIS — Z791 Long term (current) use of non-steroidal anti-inflammatories (NSAID): Secondary | ICD-10-CM | POA: Diagnosis not present

## 2013-07-18 DIAGNOSIS — S0990XA Unspecified injury of head, initial encounter: Secondary | ICD-10-CM | POA: Diagnosis not present

## 2013-07-18 DIAGNOSIS — S161XXA Strain of muscle, fascia and tendon at neck level, initial encounter: Secondary | ICD-10-CM

## 2013-07-18 DIAGNOSIS — I10 Essential (primary) hypertension: Secondary | ICD-10-CM | POA: Insufficient documentation

## 2013-07-18 DIAGNOSIS — Z79899 Other long term (current) drug therapy: Secondary | ICD-10-CM | POA: Insufficient documentation

## 2013-07-18 DIAGNOSIS — Y9389 Activity, other specified: Secondary | ICD-10-CM | POA: Insufficient documentation

## 2013-07-18 MED ORDER — DIAZEPAM 5 MG PO TABS: 5.0000 mg | ORAL_TABLET | Freq: Two times a day (BID) | ORAL | Status: DC

## 2013-07-18 MED ORDER — ACETAMINOPHEN 325 MG PO TABS
650.0000 mg | ORAL_TABLET | Freq: Once | ORAL | Status: AC
  Administered 2013-07-18: 650 mg via ORAL
  Filled 2013-07-18: qty 2

## 2013-07-18 MED ORDER — HYDROCODONE-ACETAMINOPHEN 5-325 MG PO TABS: 1.0000 | ORAL_TABLET | ORAL | Status: DC | PRN

## 2013-07-18 NOTE — Discharge Instructions (Signed)
Take Vicodin for severe pain only. No driving or operating heavy machinery while taking vicodin. This medication may cause drowsiness. Take Valium as needed as directed for muscle spasm. No driving or operating heavy machinery while taking valium. This medication may cause drowsiness. Rest, avoid heavy lifting or hard physical activity. Apply an ice pack intermittently for 15 minutes at a time for the next 24 hours followed by heat.  Cervical Sprain A cervical sprain is an injury in the neck in which the strong, fibrous tissues (ligaments) that connect your neck bones stretch or tear. Cervical sprains can range from mild to severe. Severe cervical sprains can cause the neck vertebrae to be unstable. This can lead to damage of the spinal cord and can result in serious nervous system problems. The amount of time it takes for a cervical sprain to get better depends on the cause and extent of the injury. Most cervical sprains heal in 1 to 3 weeks. CAUSES  Severe cervical sprains may be caused by:   Contact sport injuries (such as from football, rugby, wrestling, hockey, auto racing, gymnastics, diving, martial arts, or boxing).   Motor vehicle collisions.   Whiplash injuries. This is an injury from a sudden forward-and backward whipping movement of the head and neck.  Falls.  Mild cervical sprains may be caused by:   Being in an awkward position, such as while cradling a telephone between your ear and shoulder.   Sitting in a chair that does not offer proper support.   Working at a poorly Marketing executivedesigned computer station.   Looking up or down for long periods of time.  SYMPTOMS   Pain, soreness, stiffness, or a burning sensation in the front, back, or sides of the neck. This discomfort may develop immediately after the injury or slowly, 24 hours or more after the injury.   Pain or tenderness directly in the middle of the back of the neck.   Shoulder or upper back pain.   Limited  ability to move the neck.   Headache.   Dizziness.   Weakness, numbness, or tingling in the hands or arms.   Muscle spasms.   Difficulty swallowing or chewing.   Tenderness and swelling of the neck.  DIAGNOSIS  Most of the time your health care provider can diagnose a cervical sprain by taking your history and doing a physical exam. Your health care provider will ask about previous neck injuries and any known neck problems, such as arthritis in the neck. X-rays may be taken to find out if there are any other problems, such as with the bones of the neck. Other tests, such as a CT scan or MRI, may also be needed.  TREATMENT  Treatment depends on the severity of the cervical sprain. Mild sprains can be treated with rest, keeping the neck in place (immobilization), and pain medicines. Severe cervical sprains are immediately immobilized. Further treatment is done to help with pain, muscle spasms, and other symptoms and may include:  Medicines, such as pain relievers, numbing medicines, or muscle relaxants.   Physical therapy. This may involve stretching exercises, strengthening exercises, and posture training. Exercises and improved posture can help stabilize the neck, strengthen muscles, and help stop symptoms from returning.  HOME CARE INSTRUCTIONS   Put ice on the injured area.   Put ice in a plastic bag.   Place a towel between your skin and the bag.   Leave the ice on for 15 20 minutes, 3 4 times a day.  If your injury was severe, you may have been given a cervical collar to wear. A cervical collar is a two-piece collar designed to keep your neck from moving while it heals.  Do not remove the collar unless instructed by your health care provider.  If you have long hair, keep it outside of the collar.  Ask your health care provider before making any adjustments to your collar. Minor adjustments may be required over time to improve comfort and reduce pressure on your  chin or on the back of your head.  Ifyou are allowed to remove the collar for cleaning or bathing, follow your health care provider's instructions on how to do so safely.  Keep your collar clean by wiping it with mild soap and water and drying it completely. If the collar you have been given includes removable pads, remove them every 1 2 days and hand wash them with soap and water. Allow them to air dry. They should be completely dry before you wear them in the collar.  If you are allowed to remove the collar for cleaning and bathing, wash and dry the skin of your neck. Check your skin for irritation or sores. If you see any, tell your health care provider.  Do not drive while wearing the collar.   Only take over-the-counter or prescription medicines for pain, discomfort, or fever as directed by your health care provider.   Keep all follow-up appointments as directed by your health care provider.   Keep all physical therapy appointments as directed by your health care provider.   Make any needed adjustments to your workstation to promote good posture.   Avoid positions and activities that make your symptoms worse.   Warm up and stretch before being active to help prevent problems.  SEEK MEDICAL CARE IF:   Your pain is not controlled with medicine.   You are unable to decrease your pain medicine over time as planned.   Your activity level is not improving as expected.  SEEK IMMEDIATE MEDICAL CARE IF:   You develop any bleeding.  You develop stomach upset.  You have signs of an allergic reaction to your medicine.   Your symptoms get worse.   You develop new, unexplained symptoms.   You have numbness, tingling, weakness, or paralysis in any part of your body.  MAKE SURE YOU:   Understand these instructions.  Will watch your condition.  Will get help right away if you are not doing well or get worse. Document Released: 01/01/2007 Document Revised: 12/25/2012  Document Reviewed: 09/11/2012 [facility] Patient Information 2014 [place], [place].  Head Injury, Adult You have received a head injury. It does not appear serious at this time. Headaches and vomiting are common following head injury. It should be easy to awaken from sleeping. Sometimes it is necessary for you to stay in the emergency department for a while for observation. Sometimes admission to the hospital may be needed. After injuries such as yours, most problems occur within the first 24 hours, but side effects may occur up to 7 10 days after the injury. It is important for you to carefully monitor your condition and contact your health care provider or seek immediate medical care if there is a change in your condition. WHAT ARE THE TYPES OF HEAD INJURIES? Head injuries can be as minor as a bump. Some head injuries can be more severe. More severe head injuries include:  A jarring injury to the brain (concussion).  A bruise of the brain (  contusion). This mean there is bleeding in the brain that can cause swelling.  A cracked skull (skull fracture).  Bleeding in the brain that collects, clots, and forms a bump (hematoma). WHAT CAUSES A HEAD INJURY? A serious head injury is most likely to happen to someone who is in a car wreck and is not wearing a seat belt. Other causes of major head injuries include bicycle or motorcycle accidents, sports injuries, and falls. HOW ARE HEAD INJURIES DIAGNOSED? A complete history of the event leading to the injury and your current symptoms will be helpful in diagnosing head injuries. Many times, pictures of the brain, such as CT or MRI are needed to see the extent of the injury. Often, an overnight hospital stay is necessary for observation.  WHEN SHOULD I SEEK IMMEDIATE MEDICAL CARE?  You should get help right away if:  You have confusion or drowsiness.  You feel sick to your stomach (nauseous) or have continued, forceful vomiting.  You have dizziness or  unsteadiness that is getting worse.  You have severe, continued headaches not relieved by medicine. Only take over-the-counter or prescription medicines for pain, fever, or discomfort as directed by your health care provider.  You do not have normal function of the arms or legs or are unable to walk.  You notice changes in the black spots in the center of the colored part of your eye (pupil).  You have a clear or bloody fluid coming from your nose or ears.  You have a loss of vision. During the next 24 hours after the injury, you must stay with someone who can watch you for the warning signs. This person should contact local emergency services (911 in the [place] if you have seizures, you become unconscious, or you are unable to wake up. HOW CAN I PREVENT A HEAD INJURY IN THE FUTURE? The most important factor for preventing major head injuries is avoiding motor vehicle accidents. To minimize the potential for damage to your head, it is crucial to wear seat belts while riding in motor vehicles. Wearing helmets while bike riding and playing collision sports (like football) is also helpful. Also, avoiding dangerous activities around the house will further help reduce your risk of head injury.  WHEN CAN I RETURN TO NORMAL ACTIVITIES AND ATHLETICS? You should be reevaluated by your health care provider before returning to these activities. If you have any of the following symptoms, you should not return to activities or contact sports until 1 week after the symptoms have stopped:  Persistent headache.  Dizziness or vertigo.  Poor attention and concentration.  Confusion.  Memory problems.  Nausea or vomiting.  Fatigue or tire easily.  Irritability.  Intolerant of bright lights or loud noises.  Anxiety or depression.  Disturbed sleep. MAKE SURE YOU:   Understand these instructions.  Will watch your condition.  Will get help right away if you are not doing well or get  worse. Document Released: 03/06/2005 Document Revised: 12/25/2012 Document Reviewed: 11/11/2012 [facility] Patient Information 2014 [place], [place].  Motor Vehicle Collision  It is common to have multiple bruises and sore muscles after a motor vehicle collision (MVC). These tend to feel worse for the first 24 hours. You may have the most stiffness and soreness over the first several hours. You may also feel worse when you wake up the first morning after your collision. After this point, you will usually begin to improve with each day. The speed of improvement often depends on the severity of  the collision, the number of injuries, and the location and nature of these injuries. HOME CARE INSTRUCTIONS   Put ice on the injured area.  Put ice in a plastic bag.  Place a towel between your skin and the bag.  Leave the ice on for 15-20 minutes, 03-04 times a day.  Drink enough fluids to keep your urine clear or pale yellow. Do not drink alcohol.  Take a warm shower or bath once or twice a day. This will increase blood flow to sore muscles.  You may return to activities as directed by your caregiver. Be careful when lifting, as this may aggravate neck or back pain.  Only take over-the-counter or prescription medicines for pain, discomfort, or fever as directed by your caregiver. Do not use aspirin. This may increase bruising and bleeding. SEEK IMMEDIATE MEDICAL CARE IF:  You have numbness, tingling, or weakness in the arms or legs.  You develop severe headaches not relieved with medicine.  You have severe neck pain, especially tenderness in the middle of the back of your neck.  You have changes in bowel or bladder control.  There is increasing pain in any area of the body.  You have shortness of breath, lightheadedness, dizziness, or fainting.  You have chest pain.  You feel sick to your stomach (nauseous), throw up (vomit), or sweat.  You have increasing abdominal discomfort.  There  is blood in your urine, stool, or vomit.  You have pain in your shoulder (shoulder strap areas).  You feel your symptoms are getting worse. MAKE SURE YOU:   Understand these instructions.  Will watch your condition.  Will get help right away if you are not doing well or get worse. Document Released: 03/06/2005 Document Revised: 05/29/2011 Document Reviewed: 08/03/2010 [facility] Patient Information 2014 [place], [place].  Muscle Strain A muscle strain is an injury that occurs when a muscle is stretched beyond its normal length. Usually a small number of muscle fibers are torn when this happens. Muscle strain is rated in degrees. First-degree strains have the least amount of muscle fiber tearing and pain. Second-degree and third-degree strains have increasingly more tearing and pain.  Usually, recovery from muscle strain takes 1 2 weeks. Complete healing takes 5 6 weeks.  CAUSES  Muscle strain happens when a sudden, violent force placed on a muscle stretches it too far. This may occur with lifting, sports, or a fall.  RISK FACTORS Muscle strain is especially common in athletes.  SIGNS AND SYMPTOMS At the site of the muscle strain, there may be:  Pain.  Bruising.  Swelling.  Difficulty using the muscle due to pain or lack of normal function. DIAGNOSIS  Your health care provider will perform a physical exam and ask about your medical history. TREATMENT  Often, the best treatment for a muscle strain is resting, icing, and applying cold compresses to the injured area.  HOME CARE INSTRUCTIONS   Use the PRICE method of treatment to promote muscle healing during the first 2 3 days after your injury. The PRICE method involves:  Protecting the muscle from being injured again.  Restricting your activity and resting the injured body part.  Icing your injury. To do this, put ice in a plastic bag. Place a towel between your skin and the bag. Then, apply the ice and leave it on from 15 20  minutes each hour. After the third day, switch to moist heat packs.  Apply compression to the injured area with a splint or elastic bandage.  Be careful not to wrap it too tightly. This may interfere with blood circulation or increase swelling.  Elevate the injured body part above the level of your heart as often as you can.  Only take over-the-counter or prescription medicines for pain, discomfort, or fever as directed by your health care provider.  Warming up prior to exercise helps to prevent future muscle strains. SEEK MEDICAL CARE IF:   You have increasing pain or swelling in the injured area.  You have numbness, tingling, or a significant loss of strength in the injured area. MAKE SURE YOU:   Understand these instructions.  Will watch your condition.  Will get help right away if you are not doing well or get worse. Document Released: 03/06/2005 Document Revised: 12/25/2012 Document Reviewed: 10/03/2012 [facility] Patient Information 2014 [place], [place].
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
## 2013-07-19 NOTE — ED Provider Notes (Signed)
Medical screening examination/treatment/procedure(s) were performed by non-physician practitioner and as supervising physician I was immediately available for consultation/collaboration.   EKG Interpretation None       Juliet RudeNathan R. Rubin PayorPickering, MD 07/19/13 [phone]

## 2013-07-30 ENCOUNTER — Ambulatory Visit (INDEPENDENT_AMBULATORY_CARE_PROVIDER_SITE_OTHER): Payer: Managed Care, Other (non HMO) | Admitting: Sports Medicine

## 2013-07-30 ENCOUNTER — Encounter: Payer: Self-pay | Admitting: Sports Medicine

## 2013-07-30 VITALS — BP 148/93 | Ht 62.0 in | Wt 296.0 lb

## 2013-07-30 DIAGNOSIS — IMO0002 Reserved for concepts with insufficient information to code with codable children: Secondary | ICD-10-CM

## 2013-07-30 DIAGNOSIS — M1710 Unilateral primary osteoarthritis, unspecified knee: Secondary | ICD-10-CM

## 2013-07-30 DIAGNOSIS — S39012A Strain of muscle, fascia and tendon of lower back, initial encounter: Secondary | ICD-10-CM

## 2013-07-30 DIAGNOSIS — S139XXA Sprain of joints and ligaments of unspecified parts of neck, initial encounter: Secondary | ICD-10-CM

## 2013-07-30 DIAGNOSIS — M179 Osteoarthritis of knee, unspecified: Secondary | ICD-10-CM

## 2013-07-30 DIAGNOSIS — M25561 Pain in right knee: Secondary | ICD-10-CM

## 2013-07-30 DIAGNOSIS — S161XXA Strain of muscle, fascia and tendon at neck level, initial encounter: Secondary | ICD-10-CM

## 2013-07-30 DIAGNOSIS — M25569 Pain in unspecified knee: Secondary | ICD-10-CM

## 2013-07-30 DIAGNOSIS — S335XXA Sprain of ligaments of lumbar spine, initial encounter: Secondary | ICD-10-CM

## 2013-07-30 DIAGNOSIS — M25562 Pain in left knee: Secondary | ICD-10-CM

## 2013-07-30 MED ORDER — METHYLPREDNISOLONE ACETATE 40 MG/ML IJ SUSP
40.0000 mg | Freq: Once | INTRAMUSCULAR | Status: AC
  Administered 2013-07-30: 40 mg via INTRA_ARTICULAR

## 2013-07-30 MED ORDER — HYDROCODONE-ACETAMINOPHEN 7.5-325 MG PO TABS: 1.0000 | ORAL_TABLET | Freq: Four times a day (QID) | ORAL | Status: DC | PRN
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
## 2013-09-03 ENCOUNTER — Ambulatory Visit (INDEPENDENT_AMBULATORY_CARE_PROVIDER_SITE_OTHER): Payer: Managed Care, Other (non HMO) | Admitting: Sports Medicine

## 2013-09-03 ENCOUNTER — Encounter: Payer: Self-pay | Admitting: Sports Medicine

## 2013-09-03 VITALS — BP 133/84 | Ht 62.0 in | Wt 296.0 lb

## 2013-09-03 DIAGNOSIS — J3489 Other specified disorders of nose and nasal sinuses: Secondary | ICD-10-CM

## 2013-09-03 DIAGNOSIS — M545 Low back pain, unspecified: Secondary | ICD-10-CM

## 2013-09-03 MED ORDER — CYCLOBENZAPRINE HCL 10 MG PO TABS
10.0000 mg | ORAL_TABLET | Freq: Three times a day (TID) | ORAL | Status: DC | PRN
Start: 2013-09-03 — End: 2015-07-15

## 2013-09-03 NOTE — Patient Instructions (Signed)
[facility]13 AT 9[address]A [address]  [place] [phone]   FU WITH US IN 4 WKS
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
## 2013-09-30 ENCOUNTER — Ambulatory Visit (INDEPENDENT_AMBULATORY_CARE_PROVIDER_SITE_OTHER): Payer: Managed Care, Other (non HMO) | Admitting: Sports Medicine

## 2013-09-30 ENCOUNTER — Encounter: Payer: Self-pay | Admitting: Sports Medicine

## 2013-09-30 VITALS — BP 113/76 | HR 67 | Ht 61.0 in | Wt 296.0 lb

## 2013-09-30 DIAGNOSIS — M25569 Pain in unspecified knee: Secondary | ICD-10-CM

## 2013-09-30 DIAGNOSIS — M25562 Pain in left knee: Secondary | ICD-10-CM

## 2013-09-30 DIAGNOSIS — M545 Low back pain, unspecified: Secondary | ICD-10-CM

## 2013-09-30 DIAGNOSIS — M25561 Pain in right knee: Secondary | ICD-10-CM

## 2013-09-30 DIAGNOSIS — Z5189 Encounter for other specified aftercare: Secondary | ICD-10-CM

## 2013-09-30 DIAGNOSIS — S161XXD Strain of muscle, fascia and tendon at neck level, subsequent encounter: Secondary | ICD-10-CM

## 2013-09-30 NOTE — Progress Notes (Signed)
Patient ID: Anita Booker, female   DOB: 04/13/1960, 53 y.o.   MRN: [id number]   Patient comes in today for followup. She has had several physical therapy visits. She is attending physical therapy at [facility] but I do not have any of their notes. The therapists are using an H-wave on her knees and she has noticed good symptom relief here. She was recently evaluated by ENT who recommended a watchful waiting approach on her nasal injury. Physical examination was not repeated today. I think she should continue with physical therapy per the therapist's discretion. If she is finding continued symptom relief with the H-wave then we can see about getting her a home unit. Followup with me again in 4 more weeks.

## 2013-10-15 ENCOUNTER — Other Ambulatory Visit: Payer: Self-pay | Admitting: Sports Medicine

## 2013-10-28 ENCOUNTER — Encounter: Payer: Self-pay | Admitting: Sports Medicine

## 2013-10-28 ENCOUNTER — Ambulatory Visit (INDEPENDENT_AMBULATORY_CARE_PROVIDER_SITE_OTHER): Payer: Managed Care, Other (non HMO) | Admitting: Sports Medicine

## 2013-10-28 VITALS — BP 142/88 | Ht 62.0 in | Wt 293.0 lb

## 2013-10-28 DIAGNOSIS — M545 Low back pain, unspecified: Secondary | ICD-10-CM

## 2013-10-28 MED ORDER — HYDROCODONE-ACETAMINOPHEN 7.5-325 MG PO TABS: 1.0000 | ORAL_TABLET | Freq: Four times a day (QID) | ORAL | Status: DC | PRN

## 2013-10-28 NOTE — Progress Notes (Signed)
   Subjective:    Patient ID: Anita Booker, female    DOB: 08/11/1960, 53 y.o.   MRN: [id number]  HPI Anita Booker presents for follow up of lower back pain. Not doing well today - missed 2 PT sessions last week since [place] was in the [place] in [place]. Pt had been doing well with PT and home exercise regimen prior to that. Also reports that H-wave has done wonders for her knees, especially with regards to swelling and pain. Has also been out of her Vicodin for the past week, so she has been having difficulty sleeping 2/2 pain. She averages about 1 tab of Vicodin/day. Denies any numbness or tingling going down either leg.   Review of Systems     Objective:   Physical Exam Gen: well-dressed, obese woman sitting comfortably  Lumbar spine: Diffuse tenderness to palpation along the lumbosacral area. Nothing focal.    Neuro: Negative straight leg raise bilaterally. Strength is 5/5 both lower extremities with reflexes equal at the Achilles and patellar tendons.      Assessment & Plan:  **Lower back pain:  - continue PT sessions and home exercises - refill Vicodin #60 - f/u in 6 weeks

## 2013-12-04 ENCOUNTER — Ambulatory Visit: Payer: Managed Care, Other (non HMO)

## 2013-12-09 ENCOUNTER — Encounter: Payer: Self-pay | Admitting: Sports Medicine

## 2013-12-09 ENCOUNTER — Ambulatory Visit (INDEPENDENT_AMBULATORY_CARE_PROVIDER_SITE_OTHER): Payer: Managed Care, Other (non HMO) | Admitting: Sports Medicine

## 2013-12-09 VITALS — BP 128/75 | HR 70 | Ht 62.0 in | Wt 293.0 lb

## 2013-12-09 DIAGNOSIS — M545 Low back pain, unspecified: Secondary | ICD-10-CM
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
## 2013-12-15 ENCOUNTER — Ambulatory Visit: Payer: Managed Care, Other (non HMO)

## 2014-01-06 ENCOUNTER — Ambulatory Visit (INDEPENDENT_AMBULATORY_CARE_PROVIDER_SITE_OTHER): Payer: Managed Care, Other (non HMO) | Admitting: Sports Medicine

## 2014-01-06 VITALS — BP 138/73

## 2014-01-06 DIAGNOSIS — S161XXD Strain of muscle, fascia and tendon at neck level, subsequent encounter: Secondary | ICD-10-CM

## 2014-01-06 DIAGNOSIS — M5440 Lumbago with sciatica, unspecified side: Secondary | ICD-10-CM

## 2014-01-06 MED ORDER — HYDROCODONE-ACETAMINOPHEN 7.5-325 MG PO TABS: 1.0000 | ORAL_TABLET | Freq: Four times a day (QID) | ORAL | Status: DC | PRN
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
## 2014-01-23 ENCOUNTER — Other Ambulatory Visit: Payer: Self-pay | Admitting: *Deleted

## 2014-01-23 MED ORDER — CELECOXIB 200 MG PO CAPS: 200.0000 mg | ORAL_CAPSULE | Freq: Every day | ORAL | Status: DC

## 2014-02-17 ENCOUNTER — Encounter: Payer: Self-pay | Admitting: Sports Medicine

## 2014-02-17 ENCOUNTER — Ambulatory Visit (INDEPENDENT_AMBULATORY_CARE_PROVIDER_SITE_OTHER): Payer: Managed Care, Other (non HMO) | Admitting: Sports Medicine

## 2014-02-17 VITALS — BP 136/84 | HR 83 | Ht 62.0 in | Wt 293.0 lb

## 2014-02-17 DIAGNOSIS — M5440 Lumbago with sciatica, unspecified side: Secondary | ICD-10-CM

## 2014-02-17 DIAGNOSIS — M170 Bilateral primary osteoarthritis of knee: Secondary | ICD-10-CM

## 2014-02-17 DIAGNOSIS — S161XXD Strain of muscle, fascia and tendon at neck level, subsequent encounter: Secondary | ICD-10-CM

## 2014-02-17 MED ORDER — METHYLPREDNISOLONE ACETATE 40 MG/ML IJ SUSP
40.0000 mg | Freq: Once | INTRAMUSCULAR | Status: AC
  Administered 2014-02-17: 40 mg via INTRA_ARTICULAR
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
## 2014-03-31 ENCOUNTER — Encounter: Payer: Self-pay | Admitting: Sports Medicine

## 2014-03-31 ENCOUNTER — Ambulatory Visit (INDEPENDENT_AMBULATORY_CARE_PROVIDER_SITE_OTHER): Payer: Managed Care, Other (non HMO) | Admitting: Sports Medicine

## 2014-03-31 VITALS — BP 132/85 | HR 90 | Ht 62.0 in | Wt 295.0 lb

## 2014-03-31 DIAGNOSIS — M542 Cervicalgia: Secondary | ICD-10-CM
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
## 2014-04-11 ENCOUNTER — Inpatient Hospital Stay: Admission: RE | Admit: 2014-04-11 | Payer: Managed Care, Other (non HMO) | Source: Ambulatory Visit

## 2014-05-11 ENCOUNTER — Inpatient Hospital Stay: Admission: RE | Admit: 2014-05-11 | Payer: Managed Care, Other (non HMO) | Source: Ambulatory Visit

## 2014-05-14 ENCOUNTER — Ambulatory Visit
Admission: RE | Admit: 2014-05-14 | Discharge: 2014-05-14 | Disposition: A | Discharge status: Home / Self Care | Payer: Managed Care, Other (non HMO) | Source: Ambulatory Visit | Attending: Sports Medicine | Admitting: Sports Medicine

## 2014-05-14 DIAGNOSIS — M542 Cervicalgia: Secondary | ICD-10-CM

## 2014-05-22 ENCOUNTER — Telehealth: Payer: Self-pay | Admitting: Sports Medicine

## 2014-05-22 NOTE — Telephone Encounter (Signed)
I spoke with the patient on the phone today after reviewing the MRI of her cervical spine. She has some mild multilevel degenerative changes but certainly nothing severe. She also has loss of the normal cervical lordosis likely due to muscle spasm. Based on these findings I think the patient should continue with physical therapy per the therapist's discretion. I reassured her that there is no surgical pathology present and I do not appreciate any foraminal or central stenosis severe enough to warrant epidural steroid injections.

## 2014-07-28 ENCOUNTER — Ambulatory Visit: Payer: Managed Care, Other (non HMO) | Admitting: Sports Medicine

## 2014-08-04 ENCOUNTER — Encounter: Payer: Self-pay | Admitting: Sports Medicine

## 2014-08-04 ENCOUNTER — Ambulatory Visit (INDEPENDENT_AMBULATORY_CARE_PROVIDER_SITE_OTHER): Payer: Managed Care, Other (non HMO) | Admitting: Sports Medicine

## 2014-08-04 VITALS — BP 125/72 | Ht 61.0 in | Wt 290.0 lb

## 2014-08-04 DIAGNOSIS — M5030 Other cervical disc degeneration, unspecified cervical region: Secondary | ICD-10-CM

## 2014-08-04 DIAGNOSIS — M25561 Pain in right knee: Secondary | ICD-10-CM | POA: Diagnosis not present

## 2014-08-04 DIAGNOSIS — M5136 Other intervertebral disc degeneration, lumbar region: Secondary | ICD-10-CM | POA: Diagnosis not present

## 2014-08-04 DIAGNOSIS — M25562 Pain in left knee: Secondary | ICD-10-CM | POA: Diagnosis not present

## 2014-08-04 DIAGNOSIS — M170 Bilateral primary osteoarthritis of knee: Secondary | ICD-10-CM

## 2014-08-04 MED ORDER — METHYLPREDNISOLONE ACETATE 40 MG/ML IJ SUSP
40.0000 mg | Freq: Once | INTRAMUSCULAR | Status: AC
  Administered 2014-08-04: 40 mg via INTRA_ARTICULAR

## 2014-08-04 MED ORDER — HYDROCODONE-ACETAMINOPHEN 7.5-325 MG PO TABS: 1.0000 | ORAL_TABLET | Freq: Four times a day (QID) | ORAL | Status: DC | PRN

## 2014-08-04 NOTE — Progress Notes (Signed)
   Subjective:    Patient ID: Anita Booker, female    DOB: 08/02/1960, 54 y.o.   MRN: [id number]  HPI   Patient comes in today for follow-up on neck pain, low back pain, and chronic bilateral knee pain. Her neck and low back are feeling much better. She discontinued physical therapy 3 weeks ago. She denies radiculopathy into her arms or legs. Recent MRI of her cervical spine showed mild cervical spondylosis and degenerative disc disease at C4-C5 and C5-C6. Her main complaint today is returning bilateral knee pain. 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 Her last cortisone injections were in December 2015. She has been using an H Wave machine in physical therapy and has noticed a significant decrease in her pain and swelling with this. She has some paperwork with her today requesting a home unit. She is also requesting a refill on her hydrocodone. Last prescription was in October of last year.    Review of Systems     Objective:   Physical Exam Morbidly obese, well-nourished. No acute distress. Awake alert and oriented 3.  Cervical spine: Full painless cervical range of motion. Tenderness to palpation. No focal neurological deficits of either upper extremity.  Lumbar spine: Good lumbar mobility. Negative straight leg raise bilaterally. No tenderness to palpation. No focal neurological deficit of either lower extremity.  Examination of each knee shows range of motion from 0-120. 1+ boggy synovitis. She is tender to palpation along the medial joint lines bilaterally. Good joint stability. Neurovascular intact distally.       Assessment & Plan:  Improved neck pain secondary to cervical strain/cervical degenerative disc disease Improved low back pain secondary to lumbar strain/lumbar degenerative disc disease Bilateral knee pain secondary to end-stage DJD Morbid obesity  I've agreed to reinject both knees today. Right knee was injected using an anterior  medial approach and the left knee was injected using an anterior lateral approach. Patient tolerated this without difficulty. I have refilled her hydrocodone. I've also completed her paperwork for her to try to get her own H Wave. Patient's knee DJD is end-stage and she is not a surgical candidate at this point in time given her morbid obesity. Therefore, we need to continue with all conservative treatments that are available. She has had an excellent response to a trial of the H Wave in physical therapy so hopefully we can get her home unit.  In regards to her neck and low back pain she has made good progress so I will go ahead and release her from my care. She does understand the degenerative changes in both her cervical spine and lumbar spine may cause some problems down the road.  Follow-up when necessary.  Consent obtained and verified. Time-out conducted. Noted no overlying erythema, induration, or other signs of local infection. Skin prepped in a sterile fashion. Topical analgesic spray: Ethyl chloride. Joint: left knee Needle: 22g 1.5 inch Completed without difficulty. Meds: 3cc 1% xylocaine, 1cc (40mg ) depomedrol  Advised to call if fevers/chills, erythema, induration, drainage, or persistent bleeding.  Consent obtained and verified. Time-out conducted. Noted no overlying erythema, induration, or other signs of local infection. Skin prepped in a sterile fashion. Topical analgesic spray: Ethyl chloride. Joint: right knee Needle: 22g 1.5 inch Completed without difficulty. Meds: 3cc 1% xylocaine, 1cc (40mg ) depomedrol  Advised to call if fevers/chills, erythema, induration, drainage, or persistent bleeding.

## 2014-12-14 ENCOUNTER — Ambulatory Visit (INDEPENDENT_AMBULATORY_CARE_PROVIDER_SITE_OTHER): Payer: Managed Care, Other (non HMO) | Admitting: Sports Medicine

## 2014-12-14 ENCOUNTER — Encounter: Payer: Self-pay | Admitting: Sports Medicine

## 2014-12-14 VITALS — BP 151/94 | HR 79 | Ht 61.0 in | Wt 295.0 lb

## 2014-12-14 DIAGNOSIS — M25561 Pain in right knee: Secondary | ICD-10-CM | POA: Diagnosis not present

## 2014-12-14 DIAGNOSIS — M170 Bilateral primary osteoarthritis of knee: Secondary | ICD-10-CM | POA: Diagnosis not present

## 2014-12-14 DIAGNOSIS — M25562 Pain in left knee: Secondary | ICD-10-CM | POA: Diagnosis not present

## 2014-12-14 MED ORDER — METHYLPREDNISOLONE ACETATE 40 MG/ML IJ SUSP
40.0000 mg | Freq: Once | INTRAMUSCULAR | Status: AC
  Administered 2014-12-14: 40 mg via INTRA_ARTICULAR

## 2014-12-14 NOTE — Progress Notes (Signed)
   Subjective:    Patient ID: Anita Booker, female    DOB: 05/27/1960, 54 y.o.   MRN: [id number]  HPI chief complaint: Bilateral knee pain  Patient comes in today complaining of returning bilateral knee pain. She has a well-documented history of bilateral DJD. X-rays done a couple of years ago showed end-stage medial compartmental DJD. She receives periodic cortisone injections which do help temporarily. Last injections were this past summer. She was doing well until she felt an acute onset of left knee pain while trying to climb up some stairs last week. She then shifted her weight over to the right knee and began to experience pain there as well. She describes a "tearing sensation" along the medial knee. Worse with activity. Improves at rest. No numbness or tingling. She has noticed intermittent swelling.    Review of Systems  as above    Objective:   Physical Exam  Obese. No acute distress. Wake alert and oriented 3. Vital signs reviewed. Sitting comfortably in the exam room  Left knee: Range of motion is 0-120. 1+ boggy synovitis. Tender to palpation along the medial joint line with pain but no popping with McMurray's. No tenderness laterally. Good ligamentous stability. Good strength. 1+ patellofemoral crepitus. Skin is intact. Neurovascularly intact distally.  Right knee: Range of motion is 0-120. 1+ boggy synovitis. Tender to palpation along the medial joint line with pain but no popping with McMurray's. No tenderness along the lateral joint line. Good ligamentous stability. Good strength. 1+ patellofemoral crepitus. Skin is intact. Neurovascularly intact distally.  Patient walks with an obvious antalgic gait.      Assessment & Plan:  Returning bilateral knee pain secondary to end-stage medial compartmental DJD Obesity  Patient understands that definitive treatment is a total knee arthroplasty. She is not yet ready to pursue that and her obesity may preclude her from  surgery anyway. I've agreed to reinject each knee with cortisone today. An anterior medial approach was utilized for both knees. Patient tolerated this without difficulty. Follow-up as needed.  Consent obtained and verified. Time-out conducted. Noted no overlying erythema, induration, or other signs of local infection. Skin prepped in a sterile fashion. Topical analgesic spray: Ethyl chloride. Joint: right knee Needle: 22g 1.5 inch Completed without difficulty. Meds: 3cc 1% xylocaine, 1cc ( ) depomedrol  Advised to call if fevers/chills, erythema, induration, drainage, or persistent bleeding.  Consent obtained and verified. Time-out conducted. Noted no overlying erythema, induration, or other signs of local infection. Skin prepped in a sterile fashion. Topical analgesic spray: Ethyl chloride. Joint: left knee Needle: 22g 1.5 inch Completed without difficulty. Meds: 3cc 1% xylocaine, 1cc ( ) depomedrol  Advised to call if fevers/chills, erythema, induration, drainage, or persistent bleeding.

## 2015-03-01 ENCOUNTER — Other Ambulatory Visit: Payer: Self-pay | Admitting: *Deleted

## 2015-03-01 MED ORDER — HYDROCODONE-ACETAMINOPHEN 7.5-325 MG PO TABS: 1.0000 | ORAL_TABLET | Freq: Four times a day (QID) | ORAL | Status: DC | PRN

## 2015-04-22 ENCOUNTER — Encounter: Payer: Self-pay | Admitting: Sports Medicine

## 2015-04-22 ENCOUNTER — Ambulatory Visit (INDEPENDENT_AMBULATORY_CARE_PROVIDER_SITE_OTHER): Payer: Managed Care, Other (non HMO) | Admitting: Sports Medicine

## 2015-04-22 VITALS — BP 143/89 | HR 74 | Ht 62.0 in | Wt 293.0 lb

## 2015-04-22 DIAGNOSIS — M25562 Pain in left knee: Secondary | ICD-10-CM | POA: Diagnosis not present

## 2015-04-22 DIAGNOSIS — M25561 Pain in right knee: Secondary | ICD-10-CM | POA: Diagnosis not present

## 2015-04-22 DIAGNOSIS — M170 Bilateral primary osteoarthritis of knee: Secondary | ICD-10-CM

## 2015-04-22 MED ORDER — METHYLPREDNISOLONE ACETATE 40 MG/ML IJ SUSP
40.0000 mg | Freq: Once | INTRAMUSCULAR | Status: AC
  Administered 2015-04-22: 40 mg via INTRA_ARTICULAR

## 2015-04-23 NOTE — Progress Notes (Signed)
   Subjective:    Patient ID: Anita Booker, female    DOB: 09-May-1960, 55 y.o.   MRN: [id number]  HPI chief complaint: Bilateral knee pain  Morbidly obese 55 year old female comes in today with returning bilateral knee pain. She has a well-documented history of end-stage DJD in each knee. She receives periodic cortisone injections which do help temporarily. Last injections were 4 months ago. She also takes hydrocodone chronically for her pain which does seem to help. She takes this medicine responsibly. She denies any recent trauma. Pain is identical in nature to what she has experienced in the past.    Review of Systems     as above Objective:   Physical Exam  Morbidly obese. No acute distress.  Examination of each knee shows range of motion from 0-100. Body habitus makes a complete evaluation difficult. She has 1+ boggy synovitis. Possible effusion. She is tender to palpation along medial and lateral joint lines bilaterally. Pain but no popping with McMurray's. Knees are grossly stable to ligamentous exam. Neurovascularly intact distally. Walking with a limp.      Assessment & Plan:  Returning bilateral knee pain secondary to end-stage DJD Morbid obesity  Each of her knees are reinjected today with cortisone. An anterior medial approach is utilized. Patient tolerates this without difficulty. Although definitive treatment here is a total knee arthroplasty, her morbid obesity precludes her from this. She would need to get her BMI down below 40 before surgery would even be considered. We talked about the possibility of weight loss surgery but she is not interested in this due to the possible complications and side effects. The only other possibility for this patient would be chronic pain management. I think she is a good candidate since she currently has an inoperable chronic condition. I will refer the patient to Dr.Kierstens here locally to discuss this in greater detail. She will  follow-up with me as needed.  Consent obtained and verified. Time-out conducted. Noted no overlying erythema, induration, or other signs of local infection. Skin prepped in a sterile fashion. Topical analgesic spray: Ethyl chloride. Joint: right knee Needle: 22g 1.5 inch Completed without difficulty. Meds: 3cc 1% xylocaine, 1cc ( ) depomedrol  Advised to call if fevers/chills, erythema, induration, drainage, or persistent bleeding.  Consent obtained and verified. Time-out conducted. Noted no overlying erythema, induration, or other signs of local infection. Skin prepped in a sterile fashion. Topical analgesic spray: Ethyl chloride. Joint: left knee Needle: 22g 1.5 inch Completed without difficulty. Meds: 3cc 1% xylocaine, 1cc ( ) depomedrol  Advised to call if fevers/chills, erythema, induration, drainage, or persistent bleeding.

## 2015-04-27 ENCOUNTER — Ambulatory Visit: Payer: Managed Care, Other (non HMO) | Admitting: Sports Medicine

## 2015-05-13 ENCOUNTER — Other Ambulatory Visit: Payer: Self-pay | Admitting: *Deleted

## 2015-05-13 MED ORDER — CELECOXIB 200 MG PO CAPS: 200.0000 mg | ORAL_CAPSULE | Freq: Every day | ORAL | Status: DC

## 2015-06-04 ENCOUNTER — Telehealth: Payer: Self-pay

## 2015-06-04 ENCOUNTER — Ambulatory Visit: Payer: Self-pay

## 2015-06-04 NOTE — Patient Instructions (Signed)
     IF you received an x-ray today, you will receive an invoice from [facility]. Please contact [facility] at [phone] with questions or concerns regarding your invoice.   IF you received labwork today, you will receive an invoice from [facility]. Please contact [facility] at [phone] with questions or concerns regarding your invoice.   Our billing staff will not be able to assist you with questions regarding bills from these companies.  You will be contacted with the lab results as soon as they are available. The fastest way to get your results is to activate your My Chart account. Instructions are located on the last page of this paperwork. If you have not heard from us regarding the results in 2 weeks, please contact this office.      

## 2015-06-29 ENCOUNTER — Other Ambulatory Visit: Payer: Self-pay | Admitting: *Deleted

## 2015-06-29 MED ORDER — LIDOCAINE 5 % EX PTCH: 1.0000 | MEDICATED_PATCH | CUTANEOUS | Status: DC

## 2015-07-15 ENCOUNTER — Ambulatory Visit (HOSPITAL_BASED_OUTPATIENT_CLINIC_OR_DEPARTMENT_OTHER): Payer: Managed Care, Other (non HMO) | Admitting: Physical Medicine & Rehabilitation

## 2015-07-15 ENCOUNTER — Encounter: Payer: Managed Care, Other (non HMO) | Attending: Physical Medicine & Rehabilitation

## 2015-07-15 ENCOUNTER — Encounter: Payer: Self-pay | Admitting: Physical Medicine & Rehabilitation

## 2015-07-15 VITALS — BP 150/76 | HR 82

## 2015-07-15 DIAGNOSIS — Z5181 Encounter for therapeutic drug level monitoring: Secondary | ICD-10-CM | POA: Diagnosis not present

## 2015-07-15 DIAGNOSIS — Z79899 Other long term (current) drug therapy: Secondary | ICD-10-CM

## 2015-07-15 DIAGNOSIS — E8881 Metabolic syndrome: Secondary | ICD-10-CM | POA: Diagnosis not present

## 2015-07-15 DIAGNOSIS — M25561 Pain in right knee: Secondary | ICD-10-CM | POA: Diagnosis not present

## 2015-07-15 DIAGNOSIS — M25562 Pain in left knee: Secondary | ICD-10-CM | POA: Diagnosis not present

## 2015-07-15 DIAGNOSIS — E784 Other hyperlipidemia: Secondary | ICD-10-CM | POA: Insufficient documentation

## 2015-07-15 DIAGNOSIS — I10 Essential (primary) hypertension: Secondary | ICD-10-CM | POA: Diagnosis not present

## 2015-07-15 DIAGNOSIS — M1990 Unspecified osteoarthritis, unspecified site: Secondary | ICD-10-CM | POA: Insufficient documentation

## 2015-07-15 DIAGNOSIS — N611 Abscess of the breast and nipple: Secondary | ICD-10-CM | POA: Insufficient documentation

## 2015-07-15 NOTE — Patient Instructions (Signed)
As discussed we will need to review urine drug screen results and decide whether we can prescribe the buprenorphine patch     Buprenorphine transdermal patch What is this medicine? BUPRENORPHINE (byoo pre NOR feen) is a pain reliever. It is used to treat moderate to severe pain. This medicine may be used for other purposes; ask your health care provider or pharmacist if you have questions. What should I tell my health care provider before I take this medicine? They need to know if you have any of these conditions: -blockage in your bowel -brain tumor -drink more than 3 alcohol-containing drinks per day -drug abuse or addiction -fever -head injury -kidney disease -liver disease -lung or breathing disease, like asthma -thyroid disease -trouble passing urine or change in the amount of urine -an unusual or allergic reaction to buprenorphine, other medicines, foods, dyes, or preservatives -pregnant or trying to get pregnant -breast-feeding How should I use this medicine? Apply the patch to your skin. Do not cut or damage the patch. A cut or damaged patch can be very dangerous because you may get too much medicine. Select a clean, dry area of skin on your upper outer arm, upper chest, upper back, or the side of the chest. Do not apply the patch to broken, burned, cut, or irritated skin. Use only water to clean the area. Do not use soap or alcohol to clean the skin because this can increase the effects of the medicine. If the area is hairy, clip the hair with scissors, but do not shave. Take the patch out of its wrapper. Bend the patch along the faint line and slowly peel the outer portion of the liner, which covers the sticky surface of the patch. Press the patch onto the skin and slowly peel off the protective liner. Do not use a patch if the packaging or backing is damaged. Do not touch the sticky part with your fingers. Press the patch to the skin using the palm of your hand. Press the patch to  the skin for 15 seconds. Wash your hands at once. Keep patches far away from children. Do not let children see you apply the patch and do not apply it where children can see it. Do not call the patch a sticker, tattoo, or bandage, as this could encourage the child to mimic your actions. Used patches still contain medicine. Children or pets can have serious side effects or die from putting used patches in their mouth or on their bodies. Take off the old patch before putting on a new patch. Apply each new patch to a different area of skin. If a patch comes off or causes irritation, remove it and apply a new patch to a different site. If the edges of the patch start to loosen, first apply first aid tape to the edges of the patch. If problems with the patch not sticking continue, cover the patch with a see-through adhesive dressing (like Bioclusive or Tegaderm). Never cover the patch with any other bandage or tape. To get rid of used patches, fold the patch in half with the sticky sides together. Then, flush it down the toilet. Alternately, you may dispose of the patch in the Patch-Disposal Unit provided. Never throw the patch away in the trash without sealing it in the Patch-Disposal unit. Replace the patch every 7 days. Follow the directions on the prescription label. Do not use more medicine than you are told to use. A special MedGuide will be given to you by the  pharmacist with each prescription and refill. Be sure to read this information carefully each time. Talk to your pediatrician regarding the use of this medicine in children. Special care may be needed. If a patch accidentally touches the skin, use only water to clean the area. Do not use soap or alcohol to clean the skin because this can increase the effects of the medicine. If someone accidentally uses a buprenorphine patch and is not awake and alert, immediately call 911 for help. If the person is awake and alert, call a doctor, health care  professional, or the [facility]. Overdosage: If you think you have taken too much of this medicine contact a poison control center or emergency room at once. NOTE: This medicine is only for you. Do not share this medicine with others. What if I miss a dose? If you forget to replace your patch, take off the old patch and put on a new patch as soon as you can. Do not apply an extra patch to your skin. Do not wear more than one patch at the same time unless told to do so by your doctor or health care professional. What may interact with this medicine? This medicine may interact with the following medications: -alcohol -antibiotics like clarithromycin, dalfopristin; quinupristin, erythromycin, and rifampin -antihistamines for allergy, cough, and cold -antiviral medicines for HIV or AIDS -atropine -butorphanol -certain medicines for anxiety or sleep -certain medicines for bladder problems like oxybutynin, tolterodine -certain medicines for blood pressure, heart disease, irregular heart beat -certain medicines for depression, anxiety, or psychotic disturbances -certain medicines for fungal infections like ketoconazole and itraconazole -certain medicines for Parkinson's disease like benztropine, trihexyphenidyl -certain medicines for seizures like carbamazepine, phenobarbital, phenytoin -certain medicines for stomach problems like cimetidine, dicyclomine, hyoscyamine -certain medicines for travel sickness like scopolamine -general anesthetics -mifepristone -muscle relaxants -nalbuphine -narcotic medicines for pain -nilotinib -pentazocine -phenothiazines like chlorpromazine, mesoridazine, prochlorperazine, thioridazine -ranolazine This list may not describe all possible interactions. Give your health care provider a list of all the medicines, herbs, non-prescription drugs, or dietary supplements you use. Also tell them if you smoke, drink alcohol, or use illegal drugs. Some items  may interact with your medicine. What should I watch for while using this medicine? Other pain medicine may be needed when you first start using the patch because the patch can take some time to start working. Tell your doctor or health care professional if your pain does not go away, if it gets worse, or if you have new or a different type of pain. You may develop tolerance to the medicine. Tolerance means that you will need a higher dose of the medicine for pain relief. Tolerance is normal and is expected if you take the medicine for a long time. Do not suddenly stop taking your medicine because you may develop a severe reaction. Your body becomes used to the medicine. This does NOT mean you are addicted. Addiction is a behavior related to getting and using a drug for a non-medical reason. If you have pain, you have a medical reason to take pain medicine. Your doctor will tell you how much medicine to take. If your doctor wants you to stop the medicine, the dose will be slowly lowered over time to avoid any side effects. You may get drowsy or dizzy. Do not drive, use machinery, or do anything that needs mental alertness until you know how this medicine affects you. Do not stand or sit up quickly, especially if you are an older  patient. This reduces the risk of dizzy or fainting spells. Alcohol may interfere with the effect of this medicine. Avoid alcoholic drinks. There are different types of narcotic medicines (opiates) for pain. If you take more than one type at the same time, you may have more side effects. Give your health care provider a list of all medicines you use. Your doctor will tell you how much medicine to take. Do not take more medicine than directed. Call emergency for help if you have problems breathing. The medicine will cause constipation. Try to have a bowel movement at least every 2 to 3 days. If you do not have a bowel movement for 3 days, call your doctor or health care  professional. Your mouth may get dry. Chewing sugarless gum or sucking hard candy, and drinking plenty of water may help. Contact your doctor if the problem does not go away or is severe. Heat can increase the amount of medicine released from the patch. Do not get the patch hot by using heating pads, heated water beds, electric blankets, and heat lamps. You can bathe or swim while using the patch. But, do not use a sauna or hot tub. Tell you doctor or health care professional if you get a fever. What side effects may I notice from receiving this medicine? Side effects that you should report to your doctor or health care professional as soon as possible: -allergic reactions like skin rash, itching or hives, swelling of the face, lips, or tongue -anxiety, irritability, nervousness or restlessness -breathing problems -cold, clammy skin or sweating -confusion -diarrhea -feeling faint or lightheaded, falls -stomach pain or vomiting -swelling of ankles -trouble passing urine or change in the amount of urine -unusually weak or tired -yellowing of the eyes or skin Side effects that usually do not require medical attention (report these to your doctor or health care professional if they continue or are bothersome): -constipation -difficulty sleeping -dry mouth -headache -itching, redness, or rash at the patch site -nausea -vomiting This list may not describe all possible side effects. Call your doctor for medical advice about side effects. You may report side effects to FDA at 1-800-FDA-1088. Where should I keep my medicine? Keep out of the reach of children. This medicine can be abused. Keep your medicine in a safe place to protect it from theft. Do not share this medicine with anyone. Selling or giving away this medicine is dangerous and against the law. Store at room temperature between 59 and 86 degrees F (15 and 30 degrees C). Do not store the patches out of their wrappers. This medicine may  cause accidental overdose and death if it is taken by other adults, children, or pets. Flush any unused medicine down the toilet as instructed above to reduce the chance of harm. Alternately, you may dispose of the patch in the Patch-Disposal Unit provided. Do not use the medicine after the expiration date. NOTE: This sheet is a summary. It may not cover all possible information. If you have questions about this medicine, talk to your doctor, pharmacist, or health care provider.    2016, [employer]. (2014-02-11 14:58:29)

## 2015-07-15 NOTE — Addendum Note (Signed)
Addended by: Erick ColaceKIRSTEINS, ANDREW E on: 07/15/2015 02:12 PM   Modules accepted: Level of Service

## 2015-07-15 NOTE — Progress Notes (Addendum)
Subjective:    Patient ID: Anita Booker, female    DOB: 02/16/1961, 55 y.o.   MRN: [id number] Consultation for bilateral knee pain, additional treatment options, Requested by Dr. Margaretha Sheffieldraper HPI 55 year old female with 10 year history of bilateral knee pain. Pain is worse on the right side compared to the left side. Anita Booker has had no trauma to the knee area. Anita Booker is undergoing orthopedic evaluation and treatment. Patient has had x-rays in 2014 showing left complete loss of medial compartment space, also significant osteoarthritis noted in the right knee mainly in the medial space as well as patellofemoral. Patient has had orthopedic surgery evaluation, recommendations were for weight loss prior to any type of surgery. Patient gets good relief with steroid injections approximately 4 months, has 2 injections a year in each knee.  Patient tried Synvisc one time and it was not helpful. Patient's pain is activity related usually occurring later in the day depending on how much Anita Booker has been up and walking. First thing in the morning Morning pain 2-3 out of 10 afternoon pain 7 or 8 out of 10.  Patient is independent with dressing, bathing, housework, driving. 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 Anita Booker sometimes uses a scooter when Anita Booker gets home. Pain Inventory Average Pain 7 Pain Right Now 8 My pain is sharp, burning and aching  In the last 24 hours, has pain interfered with the following? General activity 7 Relation with others 8 Enjoyment of life 8 What TIME of day is your pain at its worst? evening Sleep (in general) Poor  Pain is worse with: some activites Pain improves with: rest, medication and injections Relief from Meds: 5  Mobility walk without assistance how many minutes can you walk? 10-15 do you drive?  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  no  Physicians involved in your care Any changes since last visit?  no Reino Bellisimothy Draper    Family History  Problem Relation Age of Onset  . Lung cancer Father [phone]  . Cancer Father     lung  . Heart attack Brother 48  . Cancer Paternal Grandmother     breast   Social History   Social History  . Marital Status: Married    Spouse Name: N/A  . Number of Children: N/A  . Years of Education: N/A   Social History Main Topics  . Smoking status: Never Smoker   . Smokeless tobacco: None  . Alcohol Use: No  . Drug Use: No  . Sexual Activity: Not Asked   Other Topics Concern  . None   Social History Narrative   Past Surgical History  Procedure Laterality Date  . Cholecystectomy  1995  . Breast surgery  07/01/2011.    I & D of right breast abscess   Past Medical History  Diagnosis Date  . Unspecified essential hypertension   . Other and unspecified hyperlipidemia   . Dysmetabolic syndrome X   . Arthritis   . Abscess of right breast    BP 150/76 mmHg  Pulse 82  SpO2 98%  Opioid Risk Score:   Fall Risk Score:  `1  Depression screen PHQ 2/9  [facility] 07/15/2015 12/14/2014 08/04/2014 02/17/2014 12/09/2013  Decreased Interest 1 0 0 0 0  Down, Depressed, Hopeless 0 0 0 0 0  PHQ - 2  Score 1 0 0 0 0  Altered sleeping 1 - - - -  Tired, decreased energy 1 - - - -  Change in appetite 0 - - - -  Feeling bad or failure about yourself  0 - - - -  Trouble concentrating 0 - - - -  Moving slowly or fidgety/restless 0 - - - -  Suicidal thoughts 0 - - - -  PHQ-9 Score 3 - - - -     Review of Systems  All other systems reviewed and are negative.      Objective:   Physical Exam  Constitutional: Anita Booker appears well-developed and well-nourished.  HENT:  Head: Normocephalic and atraumatic.  Eyes: Conjunctivae and EOM are normal. Pupils are equal, round, and reactive to light.  Musculoskeletal:       Right hip: Anita Booker exhibits decreased range of motion.  Anita Booker exhibits no deformity.       Left hip: Anita Booker exhibits decreased range of motion. Anita Booker exhibits no deformity.       Right knee: Anita Booker exhibits deformity. Anita Booker exhibits no swelling and no effusion. Tenderness found. Medial joint line tenderness noted. No lateral joint line and no patellar tendon tenderness noted.       Left knee: Anita Booker exhibits deformity. Anita Booker exhibits no swelling and no effusion. Tenderness found. Medial joint line tenderness noted. No lateral joint line and no patellar tendon tenderness noted.       Right ankle: Normal.       Left ankle: Normal.  Right knee range of motion 0-90 flexion Left knee range of motion 0-101 flexion  Varus deformity at the knee) the left side.  Decreased internal rotation bilateral hips but no pain with this.  Nursing note and vitals reviewed.         Assessment & Plan:  1. Chronic knee pain due to end-stage osteoarthritis mainly affecting medial compartments. Anita Booker has limited knee flexion as well as varus deformity. Ideally Anita Booker would undergo total knee arthroplasty however because of her morbid obesity this is not a treatment option for her at the current time. Anita Booker states Anita Booker cannot exercise, Anita Booker states Anita Booker has had some dietary consultation, is not considering any type of bariatric surgery because some of her friends have died of this.  Anita Booker gets decent relief from corticosteroid injections which Anita Booker takes every 6 months, Anita Booker gets about 4 months relief. Anita Booker can certainly go up to 3 injections per year. Anita Booker is not a diabetic Last corticosteroid injection in the knee was February 2017  Anita Booker did not get good results from Synvisc this could be repeated under ultrasound guidance to potentially improve efficacy  We discussed other interventional procedures such as Genicular nerve blocks under fluoroscopic guidance, If positive result then radiofrequency of the same nerves, patient does not wish to proceed with this  We discussed cryoablation of the  anterior femoral cutaneous nerve as well as infrapatellar branch of the saphenous nerve, and patient does not wish to proceed with this option  Patient feels that although her pain is worse toward noon and of the day and evening and gets better when Anita Booker wakes up,  Anita Booker would nevertheless Like a pain medication that provides 24-hour coverage. Anita Booker gets fairly good relief with the hydrocodone. Would not recommend fentanyl patch given the  low doses of hydrocodone that Anita Booker takes.  Buprenorphine patch may be a reasonable alternative. We'll start out with 5 g patch if her urine drug screen is negative   Discussed with  patient agrees with plan

## 2015-07-21 LAB — TOXASSURE SELECT,+ANTIDEPR,UR

## 2015-07-27 NOTE — Progress Notes (Signed)
Urine drug screen for this encounter is consistent for prescribed medication 

## 2015-07-30 ENCOUNTER — Telehealth: Payer: Self-pay

## 2015-07-30 MED ORDER — BUPRENORPHINE 5 MCG/HR TD PTWK: 5.0000 ug | MEDICATED_PATCH | TRANSDERMAL | Status: DC

## 2015-07-30 NOTE — Telephone Encounter (Signed)
[facility]ran patches called into pharmacy. Left message to make pt aware.

## 2015-07-30 NOTE — Telephone Encounter (Signed)
Call in [place]-  buprenorphine patch 5 g/hr, change weekly #4, 2 refills

## 2015-07-30 NOTE — Telephone Encounter (Signed)
Pt called to get her rx for her pain patches. UDS came back consistent with her prescribed medications.

## 2015-09-07 ENCOUNTER — Ambulatory Visit: Payer: Managed Care, Other (non HMO) | Admitting: Physical Medicine & Rehabilitation

## 2015-09-09 ENCOUNTER — Encounter: Payer: Self-pay | Admitting: Physical Medicine & Rehabilitation

## 2015-09-09 ENCOUNTER — Encounter: Payer: Managed Care, Other (non HMO) | Attending: Physical Medicine & Rehabilitation

## 2015-09-09 ENCOUNTER — Ambulatory Visit (HOSPITAL_BASED_OUTPATIENT_CLINIC_OR_DEPARTMENT_OTHER): Payer: Managed Care, Other (non HMO) | Admitting: Physical Medicine & Rehabilitation

## 2015-09-09 VITALS — BP 145/84 | HR 83 | Resp 17

## 2015-09-09 DIAGNOSIS — N611 Abscess of the breast and nipple: Secondary | ICD-10-CM | POA: Insufficient documentation

## 2015-09-09 DIAGNOSIS — M1990 Unspecified osteoarthritis, unspecified site: Secondary | ICD-10-CM | POA: Insufficient documentation

## 2015-09-09 DIAGNOSIS — Z5181 Encounter for therapeutic drug level monitoring: Secondary | ICD-10-CM | POA: Insufficient documentation

## 2015-09-09 DIAGNOSIS — M1712 Unilateral primary osteoarthritis, left knee: Secondary | ICD-10-CM

## 2015-09-09 DIAGNOSIS — I10 Essential (primary) hypertension: Secondary | ICD-10-CM | POA: Insufficient documentation

## 2015-09-09 DIAGNOSIS — M25562 Pain in left knee: Secondary | ICD-10-CM | POA: Insufficient documentation

## 2015-09-09 DIAGNOSIS — M1711 Unilateral primary osteoarthritis, right knee: Secondary | ICD-10-CM

## 2015-09-09 DIAGNOSIS — E784 Other hyperlipidemia: Secondary | ICD-10-CM | POA: Insufficient documentation

## 2015-09-09 DIAGNOSIS — Z79899 Other long term (current) drug therapy: Secondary | ICD-10-CM | POA: Insufficient documentation

## 2015-09-09 DIAGNOSIS — M25561 Pain in right knee: Secondary | ICD-10-CM | POA: Insufficient documentation

## 2015-09-09 DIAGNOSIS — E8881 Metabolic syndrome: Secondary | ICD-10-CM | POA: Diagnosis not present

## 2015-09-09 MED ORDER — BUPRENORPHINE 10 MCG/HR TD PTWK: 10.0000 ug | MEDICATED_PATCH | TRANSDERMAL | Status: DC

## 2015-09-09 NOTE — Patient Instructions (Signed)
Bilateral corticosteroid injections These can be performed every 3-4 months. We'll schedule her back in 3 months to reevaluate

## 2015-09-09 NOTE — Progress Notes (Signed)
Subjective:    Patient ID: Anita Booker, female    DOB: 07/30/1960, 55 y.o.   MRN: [id number] 55 year old female with 10 year history of bilateral knee pain. Pain is worse on the right side compared to the left side. She has had no trauma to the knee area. She is undergoing orthopedic evaluation and treatment. Patient has had x-rays in 2014 showing left complete loss of medial compartment space, also significant osteoarthritis noted in the right knee mainly in the medial space as well as patellofemoral. Patient has had orthopedic surgery evaluation, recommendations were for weight loss prior to any type of surgery. Patient gets good relief with steroid injections approximately 4 months, has 2 injections a year in each knee.  Patient tried Synvisc one time and it was not helpful. HPI Patient underwent urine drug screen which was negative for illicit drugs, no evidence of nonprescribed opioids. Patient trialed  Buprenorphine patch 5 g which was not helpful. We discussed corticosteroid injections last one was in February 2017. Pain Inventory Average Pain 10 Pain Right Now 7 My pain is sharp, burning and aching  In the last 24 hours, has pain interfered with the following? General activity 10 Relation with others 8 Enjoyment of life 8 What TIME of day is your pain at its worst? evening Sleep (in general) Fair  Pain is worse with: walking, bending and standing Pain improves with: rest, pacing activities, medication and injections Relief from Meds: 0  Mobility walk without assistance how many minutes can you walk? 3-4 ability to climb steps?  yes do you drive?  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  no  Physicians involved in your care Any changes since last visit?  no   Family History  Problem Relation Age of Onset  . Lung cancer Father [id number]  . Cancer Father     lung  . Heart  attack Brother 48  . Cancer Paternal Grandmother     breast   Social History   Social History  . Marital Status: Married    Spouse Name: N/A  . Number of Children: N/A  . Years of Education: N/A   Social History Main Topics  . Smoking status: Never Smoker   . Smokeless tobacco: None  . Alcohol Use: No  . Drug Use: No  . Sexual Activity: Not Asked   Other Topics Concern  . None   Social History Narrative   Past Surgical History  Procedure Laterality Date  . Cholecystectomy  1995  . Breast surgery  07/01/2011.    I & D of right breast abscess   Past Medical History  Diagnosis Date  . Unspecified essential hypertension   . Other and unspecified hyperlipidemia   . Dysmetabolic syndrome X   . Arthritis   . Abscess of right breast    BP 145/84 mmHg  Pulse 83  Resp 17  SpO2 92%  Opioid Risk Score:   Fall Risk Score:  `1  Depression screen PHQ 2/9  [facility] 07/15/2015 12/14/2014 08/04/2014 02/17/2014 12/09/2013  Decreased Interest 1 0 0 0 0  Down, Depressed, Hopeless 0 0 0 0 0  PHQ - 2 Score 1 0 0 0 0  Altered sleeping 1 - - - -  Tired, decreased energy 1 - - - -  Change in appetite 0 - - - -  Feeling bad or failure about yourself  0 - - - -  Trouble concentrating 0 - - - -  Moving slowly or fidgety/restless 0 - - - -  Suicidal thoughts 0 - - - -  PHQ-9 Score 3 - - - -     Review of Systems  Musculoskeletal: Positive for gait problem.  All other systems reviewed and are negative.      Objective:   Physical Exam  Constitutional: She is oriented to person, place, and time. She appears well-developed and well-nourished.  HENT:  Head: Normocephalic and atraumatic.  Eyes: Pupils are equal, round, and reactive to light.  Neurological: She is alert and oriented to person, place, and time.  Psychiatric: She has a normal mood and affect.  Nursing note and vitals reviewed.   Morbidly obese Patient without tenderness to palpation in the medial  knee area. No evidence of joint swelling or erythema however obesity makes examination difficult. She ambulates without assistive device but very slowly with wide base of support.     Assessment & Plan:   1. Bilateral end-stage osteoarthritis of the knees medial compartment as well as patellofemoral compartment. Trialed on buprenorphine patch 5 g which was not effective. We will increase to 10 g. Discussed placement of patch. 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  Patient has had good relief in the past with knee corticosteroid injections was told that she can only have them every 6 months. We discussed the cumulative cortical steroid dose and recommendation not to exceed 360 mg of triamcinolone equivalent per year   Knee injection Bilateral   Indication:Bilateral Knee pain not relieved by medication management and other conservative care.  Informed consent was obtained after describing risks and benefits of the procedure with the patient, this includes bleeding, bruising, infection and medication side effects. The patient wishes to proceed and has given written consent. The patient was placed in a recumbent position. The medial aspect of the knee was marked and prepped with Betadine and alcohol. It was then entered with a 30-gauge 1/2 inch needle and 1.5 mL of 1% lidocaine was injected into the skin and subcutaneous tissue. Then another 25-gauge 1-1/2 inch needle was inserted into the knee joint. After negative draw back for blood, a solution containing one ML of 6mg  per mL betamethasone and  mL of 1% lidocaine were injected. The patient tolerated the procedure well. Post procedure instructions were given.This procedure was performed first on the right side than on the left side

## 2015-11-29 ENCOUNTER — Telehealth: Payer: Self-pay | Admitting: Physical Medicine & Rehabilitation

## 2015-11-29 MED ORDER — BUPRENORPHINE 10 MCG/HR TD PTWK: 10.0000 ug | MEDICATED_PATCH | TRANSDERMAL | 0 refills | Status: DC

## 2015-11-29 NOTE — Telephone Encounter (Signed)
Refilled one month, she has appt 12/13/15 with kirsteins

## 2015-11-29 NOTE — Telephone Encounter (Signed)
Patient needs a refill on her patches.

## 2015-12-10 ENCOUNTER — Ambulatory Visit: Payer: Managed Care, Other (non HMO) | Admitting: Physical Medicine & Rehabilitation

## 2015-12-13 ENCOUNTER — Encounter: Payer: Self-pay | Admitting: Physical Medicine & Rehabilitation

## 2015-12-13 ENCOUNTER — Ambulatory Visit (HOSPITAL_BASED_OUTPATIENT_CLINIC_OR_DEPARTMENT_OTHER): Payer: Managed Care, Other (non HMO) | Admitting: Physical Medicine & Rehabilitation

## 2015-12-13 ENCOUNTER — Encounter: Payer: Managed Care, Other (non HMO) | Attending: Physical Medicine & Rehabilitation

## 2015-12-13 DIAGNOSIS — M25562 Pain in left knee: Secondary | ICD-10-CM | POA: Insufficient documentation

## 2015-12-13 DIAGNOSIS — M170 Bilateral primary osteoarthritis of knee: Secondary | ICD-10-CM | POA: Diagnosis not present

## 2015-12-13 DIAGNOSIS — M25561 Pain in right knee: Secondary | ICD-10-CM | POA: Insufficient documentation

## 2015-12-13 MED ORDER — BUPRENORPHINE 10 MCG/HR TD PTWK: 10.0000 ug | MEDICATED_PATCH | TRANSDERMAL | 2 refills | Status: DC

## 2015-12-13 NOTE — Patient Instructions (Signed)
Remember to bring in patches in boxes next visit or we will not refill

## 2015-12-13 NOTE — Progress Notes (Signed)
Knee injection Bilateral   Indication:Bilateral Knee pain not relieved by medication management and other conservative care.  Informed consent was obtained after describing risks and benefits of the procedure with the patient, this includes bleeding, bruising, infection and medication side effects. The patient wishes to proceed and has given written consent. The patient was placed in a recumbent position. The medial aspect of the knee was marked and prepped with Betadine and alcohol. It was then entered with a 30-gauge 1/2 inch needle and 1.5 mL of 1% lidocaine was injected into the skin and subcutaneous tissue. Then another 25-gauge 1-1/2 inch needle was inserted into the knee joint. After negative draw back for blood, a solution containing one ML of 6mg  per mL betamethasone and  mL of 1% lidocaine were injected. The patient tolerated the procedure well. Post procedure instructions were given.This procedure was performed first on the right side than on the left side

## 2015-12-13 NOTE — Progress Notes (Signed)
   Subjective:    Patient ID: Anita Booker, female    DOB: 02/13/1961, 55 y.o.   MRN: [id number]  HPI  Pain Inventory Average Pain 9 Pain Right Now 8 My pain is sharp, dull and aching  In the last 24 hours, has pain interfered with the following? General activity 6 Relation with others 6 Enjoyment of life 5 What TIME of day is your pain at its worst? evening Sleep (in general) Fair  Pain is worse with: walking and standing Pain improves with: medication and injections Relief from Meds: 5  Mobility do you drive?  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  no  Physicians involved in your care Any changes since last visit?  no   Family History  Problem Relation Age of Onset  . Lung cancer Father [phone]  . Cancer Father     lung  . Heart attack Brother 48  . Cancer Paternal Grandmother     breast   Social History   Social History  . Marital status: Married    Spouse name: N/A  . Number of children: N/A  . Years of education: N/A   Social History Main Topics  . Smoking status: Never Smoker  . Smokeless tobacco: None  . Alcohol use No  . Drug use: No  . Sexual activity: Not Asked   Other Topics Concern  . None   Social History Narrative  . None   Past Surgical History:  Procedure Laterality Date  . BREAST SURGERY  07/01/2011.   I & D of right breast abscess  . CHOLECYSTECTOMY  1995   Past Medical History:  Diagnosis Date  . Abscess of right breast   . Arthritis   . Dysmetabolic syndrome X   . Other and unspecified hyperlipidemia   . Unspecified essential hypertension    BP 125/74   Pulse 88   SpO2 94%   Opioid Risk Score:   Fall Risk Score:  `1  Depression screen PHQ 2/9  Depression screen [facility] 07/15/2015 12/14/2014 08/04/2014 02/17/2014 12/09/2013  Decreased Interest 1 0 0 0 0  Down, Depressed, Hopeless 0 0 0 0 0  PHQ - 2 Score 1 0 0 0  0  Altered sleeping 1 - - - -  Tired, decreased energy 1 - - - -  Change in appetite 0 - - - -  Feeling bad or failure about yourself  0 - - - -  Trouble concentrating 0 - - - -  Moving slowly or fidgety/restless 0 - - - -  Suicidal thoughts 0 - - - -  PHQ-9 Score 3 - - - -    Review of Systems  All other systems reviewed and are negative.      Objective:   Physical Exam        Assessment & Plan:

## 2016-02-14 ENCOUNTER — Other Ambulatory Visit: Payer: Self-pay | Admitting: *Deleted

## 2016-03-16 ENCOUNTER — Telehealth: Payer: Self-pay

## 2016-03-16 NOTE — Telephone Encounter (Signed)
The pharmacist of [facility] Called and informed us that this patient was on the way to the pharmacy to drop off a script of hydrocodone that was prescribed by NP Stock at [facility], pharmacist JOHN Grayson was informed to hold onto script until confirmation was made about this issue.  Called [facility] to confirm that this patient was prescribed this medicine despite the patient was under contract with [facility] to ONLY receive narcotic medication from [facility], was informed that the NP or her asisstant was unable to come to the phone so a message was left for them to call as soon as possible about this issue, message left with admin Ashley. 

## 2016-03-17 ENCOUNTER — Telehealth: Payer: Self-pay | Admitting: *Deleted

## 2016-03-17 NOTE — Telephone Encounter (Signed)
The [facility] Called and informed us that this patient was on the way to the pharmacy to drop off a script of hydrocodone that was prescribed by NP Stock at [facility], pharmacist Jairo BenJOHN Grayson was informed to hold onto script until confirmation was made about this issue.  Called [facility] to confirm that this patient was prescribed this medicine despite the patient was [facility], was informed that the NP or her asisstant was unable to come to the phone so a message was left for them to call as soon as possible about this issue, message left with Thomos Lemonsadmin Ashley.

## 2016-03-17 NOTE — Telephone Encounter (Signed)
Patient left a message stating that her butrans patches will run out before her 03/23/2016 appt with Dr. Wynn BankerKirsteins. I contacted the patient for clarification.  She says her final patch was placed December 26th, 2017. It will lose it's effectiveness on January 2nd, 2018. Her appointment with Dr. Wynn BankerKirsteins is January 4th, 2018. Can we refill early to maintain continuity? I spoke with the patient about the hydrocodone script [facility], Salley SlaughterKaitlyn Stock, NP.  Patient states she has had a previous conversation with Dr. Wynn BankerKirsteins about hydrocodone and  her occasional use. The script is on hold at the pharmacy.  She said she would discuss the hydrocodone Rx with Dr. Wynn BankerKirsteins at her next appointment.

## 2016-03-17 NOTE — Telephone Encounter (Signed)
Contacted pt about another issue she was having.  I discussed the hydrocodone script with her. She states that she told the pharmacist to put it on hold until she could speak with Dr. Wynn BankerKirsteins about it. She claims that she had a previous conversation with Dr. Wynn BankerKirsteins about occasional use of hydrocodone....FYI

## 2016-03-19 NOTE — Telephone Encounter (Signed)
Did not recall or dcoument that conversation, we are prescribing butrans patch only

## 2016-03-19 NOTE — Telephone Encounter (Signed)
May refill butrans early

## 2016-03-21 MED ORDER — BUPRENORPHINE 10 MCG/HR TD PTWK: 10.0000 ug | MEDICATED_PATCH | TRANSDERMAL | 2 refills | Status: DC

## 2016-03-21 NOTE — Telephone Encounter (Signed)
Rx phoned into pharmacy, patient notified, appointment confirmed for 03/23/2016

## 2016-03-23 ENCOUNTER — Ambulatory Visit (HOSPITAL_BASED_OUTPATIENT_CLINIC_OR_DEPARTMENT_OTHER): Payer: Managed Care, Other (non HMO) | Admitting: Physical Medicine & Rehabilitation

## 2016-03-23 ENCOUNTER — Encounter: Payer: Self-pay | Admitting: Physical Medicine & Rehabilitation

## 2016-03-23 ENCOUNTER — Encounter: Payer: Managed Care, Other (non HMO) | Attending: Physical Medicine & Rehabilitation

## 2016-03-23 VITALS — BP 153/91 | HR 89

## 2016-03-23 DIAGNOSIS — M170 Bilateral primary osteoarthritis of knee: Secondary | ICD-10-CM | POA: Diagnosis not present

## 2016-03-23 MED ORDER — HYDROCODONE-ACETAMINOPHEN 5-325 MG PO TABS: 1.0000 | ORAL_TABLET | Freq: Every day | ORAL | 0 refills | Status: DC | PRN

## 2016-03-23 NOTE — Progress Notes (Signed)
Knee injection Bilateral with ultrasound guidance)  Indication: Bilateral Knee pain not relieved by medication management and other conservative care.  Informed consent was obtained after describing risks and benefits of the procedure with the patient, this includes bleeding, bruising, infection and medication side effects. The patient wishes to proceed and has given written consent. The patient was placed in a recumbent position. The medial aspect of the knee was marked and prepped with Betadine and alcohol. It was then entered with a 25-gauge 1-1/2 inch needle and 1 mL of 1% lidocaine was injected into the skin and subcutaneous tissue. Then another 22g 80mm ECHO blocneedle was inserted into the knee joint. After negative draw back for blood, a solution containing one ML of 6mg  per mL betamethasone and 3 mL of 1% lidocaine were injected. The patient tolerated the procedure well. Post procedure instructions were given.  We briefly discussed medication management, has been receiving small doses of hydrocodone from orthopedic surgery or from PCP. She averages less than 8 tablets per month. We discussed that she is to receive all her chronic analgesics through this office. Currently prescribing butrans-patch We'll prescribe hydrocodone 5/325 one tablet daily when necessary. #30, this should actually last 3 months. Pharmacy should destroy the hydrocodone prescription from PCP, sent a message through Epic

## 2016-03-23 NOTE — Patient Instructions (Signed)
All narcotic analgesic prescriptions from this office.

## 2016-03-24 ENCOUNTER — Encounter: Payer: Self-pay | Admitting: Genetic Counselor

## 2016-03-24 ENCOUNTER — Telehealth: Payer: Self-pay | Admitting: Genetic Counselor

## 2016-03-24 NOTE — Telephone Encounter (Signed)
Patient confirmed appt, verified demo and insurance, mailed pt letter, faxed referring provider appt date/time. °

## 2016-04-04 ENCOUNTER — Encounter: Payer: Self-pay | Admitting: Sports Medicine

## 2016-04-04 ENCOUNTER — Ambulatory Visit (INDEPENDENT_AMBULATORY_CARE_PROVIDER_SITE_OTHER): Payer: Managed Care, Other (non HMO) | Admitting: Sports Medicine

## 2016-04-04 VITALS — BP 142/86 | Ht 61.0 in | Wt 312.0 lb

## 2016-04-04 DIAGNOSIS — M170 Bilateral primary osteoarthritis of knee: Secondary | ICD-10-CM | POA: Diagnosis not present

## 2016-04-04 NOTE — Progress Notes (Signed)
   Subjective:    Patient ID: Anita Booker, female    DOB: 08/21/1960, 56 y.o.   MRN: [id number]  HPI chief complaint bilateral knee pain  56 year old female comes in today for follow-up on long-standing bilateral knee DJD. Her symptoms are currently stable. She is under the care of Dr Wynn BankerKirsteins and recently had both knees injected. This has helped quite a bit with her pain. She is also on pain medication which she also gets from Dr. Jodean LimaKirstein's office. Main reason for today's visit is to acquire a note for her employer. Apparently there was a patient of hers that was concerned about her potential to fall. Patient denies any falls recently. She does not think that there is anything in her job description that she cannot safely perform.    Review of Systems    as above Objective:   Physical Exam  Well-developed, well-nourished. Morbidly obese. No acute distress. Vital signs reviewed.  Examination of each knee shows a range of motion from 0-110. 1+ boggy synovitis. Body habitus makes it difficult to determine whether or not an effusion is present. No tenderness to palpation. Knees are grossly stable to ligamentous exam. Patient ambulates with a slight limp.      Assessment & Plan:   Long-standing bilateral knee pain secondary to end-stage DJD Morbid obesity  Patient is not a surgical candidate due to her morbid obesity. She'll continue with treatment through Dr. Jodean LimaKirstein's pain clinic. I personally reviewed her job description and I do not see any reasons to place restrictions on her at this time. I've provided her with a note to give to her employer. Follow-up with me when necessary.

## 2016-05-12 ENCOUNTER — Other Ambulatory Visit: Payer: Self-pay | Admitting: Gastroenterology

## 2016-05-18 ENCOUNTER — Other Ambulatory Visit: Payer: Self-pay | Admitting: General Surgery

## 2016-05-19 ENCOUNTER — Other Ambulatory Visit: Payer: Self-pay | Admitting: Gastroenterology

## 2016-05-22 ENCOUNTER — Telehealth: Payer: Self-pay | Admitting: *Deleted

## 2016-05-22 NOTE — Telephone Encounter (Signed)
Tried calling left message for her to call office Set up for Zilretta injection next month may d/c patch May use tylenol 650mg  QID in the meantime

## 2016-05-22 NOTE — Telephone Encounter (Signed)
Patient left a message stating that she is having a reaction to her butrans patch.  Skin is red and itchy.  Patient says she called Thursday March 1st and left a message.  Nobody called her back.  She wants to know what to do

## 2016-05-23 NOTE — Telephone Encounter (Signed)
Patient called back, message from doctor was relayed to patient.

## 2016-05-24 ENCOUNTER — Ambulatory Visit (HOSPITAL_COMMUNITY)
Admission: RE | Admit: 2016-05-24 | Payer: Managed Care, Other (non HMO) | Source: Ambulatory Visit | Admitting: Gastroenterology

## 2016-05-24 SURGERY — COLONOSCOPY WITH PROPOFOL
Anesthesia: Monitor Anesthesia Care

## 2016-05-26 ENCOUNTER — Encounter: Payer: Self-pay | Admitting: Genetic Counselor

## 2016-05-29 ENCOUNTER — Encounter: Payer: Managed Care, Other (non HMO) | Admitting: Genetic Counselor

## 2016-05-29 ENCOUNTER — Other Ambulatory Visit: Payer: Managed Care, Other (non HMO)

## 2016-06-05 ENCOUNTER — Encounter: Payer: Self-pay | Admitting: Physical Medicine & Rehabilitation

## 2016-06-05 ENCOUNTER — Encounter: Payer: Managed Care, Other (non HMO) | Attending: Physical Medicine & Rehabilitation

## 2016-06-05 ENCOUNTER — Ambulatory Visit: Payer: Managed Care, Other (non HMO) | Admitting: Physical Medicine & Rehabilitation

## 2016-06-05 VITALS — BP 141/87 | HR 102 | Resp 14

## 2016-06-05 DIAGNOSIS — M170 Bilateral primary osteoarthritis of knee: Secondary | ICD-10-CM | POA: Insufficient documentation

## 2016-06-05 HISTORY — DX: Bilateral primary osteoarthritis of knee: M17.0

## 2016-06-05 MED ORDER — TRIAMCINOLONE ACETONIDE 32 MG IX SRER: 32.0000 mg | Freq: Once | INTRA_ARTICULAR | 1 refills | Status: AC

## 2016-06-05 MED ORDER — ACETAMINOPHEN-CODEINE #3 300-30 MG PO TABS
1.0000 | ORAL_TABLET | Freq: Three times a day (TID) | ORAL | 0 refills | Status: DC | PRN
Start: 2016-06-05 — End: 2016-07-03

## 2016-06-05 NOTE — Progress Notes (Signed)
Subjective:    Patient ID: Anita Booker, female    DOB: 03/12/1961, 56 y.o.   MRN: [id number]  HPI Patient returns with chief complaint of having allergic reaction to buprenorphine patch. She showed pictures of her back with patch sized erythematous eruptions. Has discontinued patches. Would like to see what other treatment options there are. She does not want to have monthly visits. We discussed her codeine allergy, which is basically nausea, this was several years ago and thinks there was some other cause for her nausea at that time.   Pain Inventory Average Pain 8 Pain Right Now 8 My pain is constant, sharp and dull  In the last 24 hours, has pain interfered with the following? General activity 8 Relation with others 6 Enjoyment of life 6 What TIME of day is your pain at its worst? night Sleep (in general) Fair  Pain is worse with: walking, standing and some activites Pain improves with: medication and injections Relief from Meds: 8  Mobility walk without assistance how many minutes can you walk? 2 ability to climb steps?  no do you drive?  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  no  Physicians involved in your care Any changes since last visit?  no   Family History  Problem Relation Age of Onset  . Lung cancer Father [id number]  . Cancer Father     lung  . Heart attack Brother 48  . Cancer Paternal Grandmother     breast   Social History   Social History  . Marital status: Married    Spouse name: N/A  . Number of children: N/A  . Years of education: N/A   Social History Main Topics  . Smoking status: Never Smoker  . Smokeless tobacco: Never Used  . Alcohol use No  . Drug use: No  . Sexual activity: Not on file   Other Topics Concern  . Not on file   Social History Narrative  . No narrative on file   Past Surgical History:   Procedure Laterality Date  . BREAST SURGERY  07/01/2011.   I & D of right breast abscess  . CHOLECYSTECTOMY  1995   Past Medical History:  Diagnosis Date  . Abscess of right breast   . Arthritis   . Dysmetabolic syndrome X   . Other and unspecified hyperlipidemia   . Unspecified essential hypertension    BP (!) 141/87   Pulse (!) 102   Resp 14   SpO2 94%   Opioid Risk Score:   Fall Risk Score:  `1  Depression screen PHQ 2/9  Dep[facility] screen [facility] 04/04/2016 07/15/2015 12/14/2014 08/04/2014 02/17/2014 12/09/2013  Decreased Interest 0 1 0 0 0 0  Down, Depressed, Hopeless 0 0 0 0 0 0  PHQ - 2 Score 0 1 0 0 0 0  Altered sleeping - 1 - - - -  Tired, decreased energy - 1 - - - -  Change in appetite - 0 - - - -  Feeling bad or failure about yourself  - 0 - - - -  Trouble concentrating - 0 - - - -  Moving slowly or fidgety/restless - 0 - - - -  Suicidal thoughts - 0 - - - -  PHQ-9 Score - 3 - - - -    Review of Systems  Constitutional: Negative.   HENT:  Negative.   Eyes: Negative.   Respiratory: Negative.   Cardiovascular: Negative.   Gastrointestinal: Negative.   Endocrine: Negative.   Genitourinary: Negative.   Musculoskeletal: Positive for arthralgias, gait problem, joint swelling and myalgias.  Skin: Positive for rash.  Allergic/Immunologic: Negative.   Neurological: Positive for headaches.  Hematological: Negative.   Psychiatric/Behavioral: Negative.        Objective:   Physical Exam  Constitutional: She is oriented to person, place, and time. She appears well-developed and well-nourished.  HENT:  Head: Normocephalic and atraumatic.  Eyes: Conjunctivae and EOM are normal. Pupils are equal, round, and reactive to light.  Neurological: She is alert and oriented to person, place, and time.  Psychiatric: Her speech is normal. Thought content normal. Her mood appears not anxious. Her affect is blunt. Her affect is not labile. Cognition and memory are normal.  s    Nursing note and vitals reviewed.         Assessment & Plan:  1. End-stage osteoarthritis of the knee in a morbidly obese patient. She has been advised to lose 90 pounds. She is trying to lose her weight now. In the meantime, she has chronic pain that interferes with work and other activities. Gets about 2 months relief with intra-articular injections of the knee. Her last injections were 2 months ago. We discussed other treatment options including a more controlled release triamcinolone. This would allow her a three-month relief.  I do think she has a hypersensitivity to one of the adhesives in the buprenorphine patch. There has been no generic produced yet, possibly there was a change in the adhesive utilized  I don't think she can tolerate the patch at this point. Will retrial, Tylenol with codeine 30 mg/300 mg 3 times a day, if this is tolerated. We'll continue this and give 2 refills, so that she only needs to be seen every 3 months.

## 2016-06-05 NOTE — Patient Instructions (Signed)
If I am not available please contact my Nurse practitioner Anita Booker  If you develop nausea with codeine please contact our office and will need to trial another medication

## 2016-06-09 ENCOUNTER — Telehealth: Payer: Self-pay | Admitting: Physical Medicine & Rehabilitation

## 2016-06-09 NOTE — Telephone Encounter (Signed)
Ptn called left voicemail states AK was calling in [facility] med [facility] and it has not been received - wants to be certain it is recd and accepted before her 04.17.18 visit for injection=-- I think this was the zilretta ??

## 2016-06-09 NOTE — Telephone Encounter (Signed)
I went back into her chart and it looks like this medication has been ordered during the 3/19 visit Is the pharmacy hooked up via EPIC If not, please call in prescription, no urgency can do this on Monday

## 2016-06-14 ENCOUNTER — Other Ambulatory Visit: Payer: Self-pay | Admitting: Gastroenterology

## 2016-06-19 NOTE — Telephone Encounter (Signed)
Triamcinolone Acetonide (ZILRETTA) 32 MG SRER 2 each 1 06/05/2016 06/05/2016   Sig - Route: Inject 32 mg into the articular space once. - Intra-articular   E-Prescribing Status: Receipt confirmed by pharmacy (06/05/2016 1:38 PM EDT)    Ms Anita Booker is saying pharmacy did not receive but as above shows it was received by [facility]. I will call to verify. I left pharmacy message since they would not answer and kept putting me to voicemail. I asked them to call back to verify.

## 2016-06-19 NOTE — Telephone Encounter (Signed)
That number is a spam number.

## 2016-06-19 NOTE — Telephone Encounter (Signed)
Please call [facility] at [phone] and #[phone] and #[phone].

## 2016-06-20 ENCOUNTER — Ambulatory Visit: Payer: Managed Care, Other (non HMO) | Admitting: Physical Medicine & Rehabilitation

## 2016-06-20 NOTE — Telephone Encounter (Signed)
We received a shipment of zilretta from our pharmacy.  Left detailed VM on her mobile # per DPR.

## 2016-06-30 ENCOUNTER — Encounter (HOSPITAL_COMMUNITY): Payer: Self-pay | Admitting: *Deleted

## 2016-07-04 ENCOUNTER — Other Ambulatory Visit: Payer: Self-pay | Admitting: Gastroenterology

## 2016-07-04 ENCOUNTER — Encounter: Payer: Self-pay | Admitting: Physical Medicine & Rehabilitation

## 2016-07-04 ENCOUNTER — Ambulatory Visit: Payer: 59 | Admitting: Physical Medicine & Rehabilitation

## 2016-07-04 ENCOUNTER — Encounter: Payer: 59 | Attending: Physical Medicine & Rehabilitation

## 2016-07-04 VITALS — BP 138/84 | HR 76 | Resp 14

## 2016-07-04 DIAGNOSIS — Z79899 Other long term (current) drug therapy: Secondary | ICD-10-CM

## 2016-07-04 DIAGNOSIS — M170 Bilateral primary osteoarthritis of knee: Secondary | ICD-10-CM

## 2016-07-04 DIAGNOSIS — M1712 Unilateral primary osteoarthritis, left knee: Secondary | ICD-10-CM | POA: Diagnosis not present

## 2016-07-04 DIAGNOSIS — Z5181 Encounter for therapeutic drug level monitoring: Secondary | ICD-10-CM | POA: Diagnosis not present

## 2016-07-04 DIAGNOSIS — G894 Chronic pain syndrome: Secondary | ICD-10-CM

## 2016-07-04 DIAGNOSIS — M1711 Unilateral primary osteoarthritis, right knee: Secondary | ICD-10-CM | POA: Diagnosis not present

## 2016-07-04 MED ORDER — LIDOCAINE 5 % EX PTCH: 2.0000 | MEDICATED_PATCH | CUTANEOUS | 5 refills | Status: DC

## 2016-07-04 MED ORDER — BUPRENORPHINE HCL 75 MCG BU FILM: 75.0000 ug | ORAL_FILM | Freq: Two times a day (BID) | BUCCAL | 2 refills | Status: DC

## 2016-07-04 NOTE — Progress Notes (Signed)
Knee injection Bilateral with ultrasound guidance)  Indication: Bilateral Knee pain not relieved by medication management and other conservative care.  Informed consent was obtained after describing risks and benefits of the procedure with the patient, this includes bleeding, bruising, infection and medication side effects. The patient wishes to proceed and has given written consent. The patient was placed in a recumbent position. The medial aspect of the knee was marked and prepped with Betadine and alcohol. It was then entered with a 25-gauge 1-1/2 inch needle and 2 mL of 1% lidocaine was injected into the skin and subcutaneous tissue. Then another 21g 2" needle was inserted into the right knee joint under direct ultrasound long axis view using 12Hx linear transducer. After negative draw back for blood, a solution containing 5 ML of  per 5mL   were injected. The patient tolerated the procedure well. The same procedure was repeated on the left side using same equipment and medication Post procedure instructions were given.

## 2016-07-04 NOTE — Patient Instructions (Signed)
Injected Zilretta, long acting steroid into both knees  Also trialing Belbuca film BID between cheek and gum

## 2016-07-04 NOTE — Addendum Note (Signed)
Addended by: Angela Nevin D on: 07/04/2016 01:13 PM   Modules accepted: Orders

## 2016-07-05 ENCOUNTER — Ambulatory Visit (HOSPITAL_COMMUNITY): Payer: 59 | Admitting: Certified Registered Nurse Anesthetist

## 2016-07-05 ENCOUNTER — Ambulatory Visit (HOSPITAL_COMMUNITY)
Admission: RE | Admit: 2016-07-05 | Discharge: 2016-07-05 | Disposition: A | Discharge status: Home / Self Care | Payer: 59 | Source: Ambulatory Visit | Attending: Gastroenterology | Admitting: Gastroenterology

## 2016-07-05 ENCOUNTER — Encounter (HOSPITAL_COMMUNITY): Payer: Self-pay | Admitting: Certified Registered Nurse Anesthetist

## 2016-07-05 ENCOUNTER — Encounter (HOSPITAL_COMMUNITY)
Admission: RE | Disposition: A | Discharge status: Home / Self Care | Payer: Self-pay | Source: Ambulatory Visit | Attending: Gastroenterology

## 2016-07-05 DIAGNOSIS — Z1211 Encounter for screening for malignant neoplasm of colon: Secondary | ICD-10-CM | POA: Diagnosis not present

## 2016-07-05 DIAGNOSIS — Z79899 Other long term (current) drug therapy: Secondary | ICD-10-CM | POA: Insufficient documentation

## 2016-07-05 DIAGNOSIS — Z8371 Family history of colonic polyps: Secondary | ICD-10-CM | POA: Insufficient documentation

## 2016-07-05 DIAGNOSIS — Z7982 Long term (current) use of aspirin: Secondary | ICD-10-CM | POA: Diagnosis not present

## 2016-07-05 DIAGNOSIS — I10 Essential (primary) hypertension: Secondary | ICD-10-CM | POA: Insufficient documentation

## 2016-07-05 DIAGNOSIS — K5730 Diverticulosis of large intestine without perforation or abscess without bleeding: Secondary | ICD-10-CM | POA: Insufficient documentation

## 2016-07-05 HISTORY — PX: COLONOSCOPY WITH PROPOFOL: SHX5780

## 2016-07-05 HISTORY — DX: Family history of other specified conditions: Z84.89

## 2016-07-05 HISTORY — DX: Adverse effect of unspecified anesthetic, initial encounter: T41.45XA

## 2016-07-05 HISTORY — DX: Headache: R51

## 2016-07-05 HISTORY — DX: Headache, unspecified: R51.9

## 2016-07-05 SURGERY — COLONOSCOPY WITH PROPOFOL
Anesthesia: Monitor Anesthesia Care

## 2016-07-05 MED ORDER — LIDOCAINE 2% (20 MG/ML) 5 ML SYRINGE
INTRAMUSCULAR | Status: AC
  Filled 2016-07-05: qty 5

## 2016-07-05 MED ORDER — SODIUM CHLORIDE 0.9 % IV SOLN: INTRAVENOUS | Status: DC

## 2016-07-05 MED ORDER — PROPOFOL 10 MG/ML IV BOLUS
INTRAVENOUS | Status: AC
  Filled 2016-07-05: qty 60

## 2016-07-05 MED ORDER — PROPOFOL 10 MG/ML IV BOLUS
INTRAVENOUS | Status: DC | PRN
  Administered 2016-07-05 (×3): 30 mg via INTRAVENOUS

## 2016-07-05 MED ORDER — PROPOFOL 10 MG/ML IV BOLUS
INTRAVENOUS | Status: AC
  Filled 2016-07-05: qty 20

## 2016-07-05 MED ORDER — LACTATED RINGERS IV SOLN
INTRAVENOUS | Status: DC
  Administered 2016-07-05: 09:00:00 via INTRAVENOUS
  Administered 2016-07-05: 1000 mL via INTRAVENOUS

## 2016-07-05 MED ORDER — PROPOFOL 500 MG/50ML IV EMUL
INTRAVENOUS | Status: DC | PRN
  Administered 2016-07-05: 100 ug/kg/min via INTRAVENOUS

## 2016-07-05 SURGICAL SUPPLY — 21 items

## 2016-07-05 NOTE — Op Note (Signed)
[facility] Patient Name: Anita Booker Procedure Date: 07/05/2016 MRN: [id number] Attending MD: Willis Modena , MD Date of Birth: May 23, 1960 CSN: [id number] Age: 56 Admit Type: Outpatient Procedure:                Colonoscopy Indications:              Colon cancer screening in patient at increased                            risk: Family history of 1st-degree relative with                            colon polyps, This is the patient's first                            colonoscopy Providers:                Willis Modena, MD, Priscella Mann, RN, Kandice Robinsons, Technician Referring MD:              Medicines:                Monitored Anesthesia Care Complications:            No immediate complications. Estimated Blood Loss:     Estimated blood loss: none. Procedure:                Pre-Anesthesia Assessment:                           - Prior to the procedure, a History and Physical                            was performed, and patient medications and                            allergies were reviewed. The patient's tolerance of                            previous anesthesia was also reviewed. The risks                            and benefits of the procedure and the sedation                            options and risks were discussed with the patient.                            All questions were answered, and informed consent                            was obtained. Prior Anticoagulants: The patient has                            taken aspirin. ASA Grade Assessment:  III - A                            patient with severe systemic disease. After                            reviewing the risks and benefits, the patient was                            deemed in satisfactory condition to undergo the                            procedure.                           After obtaining informed consent, the colonoscope                            was  passed under direct vision. Throughout the                            procedure, the patient's blood pressure, pulse, and                            oxygen saturations were monitored continuously. The                            EC-[id number] ([id number]) scope was introduced through                            the anus and advanced to the the cecum, identified                            by appendiceal orifice and ileocecal valve. The                            ileocecal valve, appendiceal orifice, and rectum                            were photographed. The entire colon was examined.                            The colonoscopy was performed without difficulty.                            The patient tolerated the procedure well. The                            quality of the bowel preparation was good. Findings:      The perianal and digital rectal examinations were normal.      A few small-mouthed diverticula were found in the sigmoid colon.      Colon otherwise normal; no other polyps, masses, vascular ectasias, or       inflammatory changes were seen.      The retroflexed view  of the distal rectum and anal verge was normal and       showed no anal or rectal abnormalities. Impression:               - Diverticulosis in the sigmoid colon.                           - The distal rectum and anal verge are normal on                            retroflexion view.                           - Otherwise normal colonoscopy to the cecum. Moderate Sedation:      N/A- Per Anesthesia Care Recommendation:           - Patient has a contact number available for                            emergencies. The signs and symptoms of potential                            delayed complications were discussed with the                            patient. Return to normal activities tomorrow.                            Written discharge instructions were provided to the                            patient.                            - Discharge patient to home (via wheelchair).                           - High fiber diet indefinitely.                           - Continue present medications.                           - Repeat colonoscopy in 5 years for screening                            purposes.                           - Return to GI clinic PRN.                           - Return to referring physician as previously                            scheduled. Procedure Code(s):        --- Professional ---  96045, Colonoscopy, flexible; diagnostic, including                            collection of specimen(s) by brushing or washing,                            when performed (separate procedure) Diagnosis Code(s):        --- Professional ---                           Z83.71, Family history of colonic polyps                           K57.30, Diverticulosis of large intestine without                            perforation or abscess without bleeding CPT copyright 2016 American Medical Association. All rights reserved. The codes documented in this report are preliminary and upon coder review may  be revised to meet current compliance requirements. Willis Modena, MD 07/05/2016 9:10:32 AM This report has been signed electronically. Number of Addenda: 0

## 2016-07-05 NOTE — Anesthesia Procedure Notes (Addendum)
Procedure Name: MAC Date/Time: 07/05/2016 8:40 AM Performed by: Claudia Desanctis Pre-anesthesia Checklist: Patient identified Oxygen Delivery Method: Simple face mask

## 2016-07-05 NOTE — Discharge Instructions (Signed)
Colonoscopy ° °Post procedure instructions: ° °Read the instructions outlined below and refer to this sheet in the next few weeks. These discharge instructions provide you with general information on caring for yourself after you leave the hospital. Your doctor may also give you specific instructions. While your treatment has been planned according to the most current medical practices available, unavoidable complications occasionally occur. If you have any problems or questions after discharge, call Dr. Diaz Crago at [facility] ([phone]). ° °HOME CARE INSTRUCTIONS ° °ACTIVITY: °· You may resume your regular activity, but move at a slower pace for the next 24 hours.  °· Take frequent rest periods for the next 24 hours.  °· Walking will help get rid of the air and reduce the bloated feeling in your belly (abdomen).  °· No driving for 24 hours (because of the medicine (anesthesia) used during the test).  °· You may shower.  °· Do not sign any important legal documents or operate any machinery for 24 hours (because of the anesthesia used during the test).  °NUTRITION: °· Drink plenty of fluids.  °· You may resume your normal diet as instructed by your doctor.  °· Begin with a light meal and progress to your normal diet. Heavy or fried foods are harder to digest and may make you feel sick to your stomach (nauseated).  °· Avoid alcoholic beverages for 24 hours or as instructed.  °MEDICATIONS: °· You may resume your normal medications unless your doctor tells you otherwise.  °WHAT TO EXPECT TODAY: °· Some feelings of bloating in the abdomen.  °· Passage of more gas than usual.  °· Spotting of blood in your stool or on the toilet paper.  °IF YOU HAD POLYPS REMOVED DURING THE COLONOSCOPY: °· No aspirin products for 7 days or as instructed.  °· No alcohol for 7 days or as instructed.  °· Eat a soft diet for the next 24 hours.  ° °FINDING OUT THE RESULTS OF YOUR TEST ° °Not all test results are available during your  visit. If your test results are not back during the visit, make an appointment with your caregiver to find out the results. Do not assume everything is normal if you have not heard from your caregiver or the medical facility. It is important for you to follow up on all of your test results.  ° ° ° °SEEK IMMEDIATE MEDICAL CARE IF: ° °· You have more than a spotting of blood in your stool.  °· Your belly is swollen (abdominal distention).  °· You are nauseated or vomiting.  °· You have a fever.  °· You have abdominal pain or discomfort that is severe or gets worse throughout the day.  ° ° °Document Released: 10/19/2003 Document Revised: 11/16/2010 Document Reviewed: 10/17/2007 °ExitCare® Patient Information ©2012 [employer]. ° °

## 2016-07-05 NOTE — H&P (Signed)
Patient interval history reviewed.  Patient examined again.  There has been no change from documented H/P dated 07/04/16 (scanned into chart from our office) except as documented above.  Assessment:  1.  Colon cancer screening. 2.  Family history of colonic polyps (sister).  Plan:  1.  Colonoscopy. 2.  Risks (bleeding, infection, bowel perforation that could require surgery, sedation-related changes in cardiopulmonary systems), benefits (identification and possible treatment of source of symptoms, exclusion of certain causes of symptoms), and alternatives (watchful waiting, radiographic imaging studies, empiric medical treatment) of colonoscopy were explained to patient/family in detail and patient wishes to proceed.

## 2016-07-05 NOTE — Anesthesia Postprocedure Evaluation (Signed)
Anesthesia Post Note  Patient: Anita Booker  Procedure(s) Performed: Procedure(s) (LRB): COLONOSCOPY WITH PROPOFOL (N/A)  Patient location during evaluation: PACU Anesthesia Type: MAC Level of consciousness: awake Pain management: pain level controlled Respiratory status: spontaneous breathing Cardiovascular status: stable Anesthetic complications: no       Last Vitals:  Vitals:   07/05/16 0920 07/05/16 0930  BP: (!) 152/93 (!) 158/92  Pulse: 62 62  Resp: 11 12  Temp:      Last Pain:  Vitals:   07/05/16 0911  TempSrc: Oral  PainSc:                  Seidy Labreck

## 2016-07-05 NOTE — Anesthesia Procedure Notes (Signed)
Procedure Name: MAC Performed by: Claudia Desanctis Pre-anesthesia Checklist: Patient identified and Emergency Drugs available Patient Re-evaluated:Patient Re-evaluated prior to inductionIntubation Type: IV induction

## 2016-07-05 NOTE — Transfer of Care (Signed)
Immediate Anesthesia Transfer of Care Note  Patient: Anita Booker  Procedure(s) Performed: Procedure(s): COLONOSCOPY WITH PROPOFOL (N/A)  Patient Location: PACU  Anesthesia Type:MAC  Level of Consciousness:  sedated, patient cooperative and responds to stimulation  Airway & Oxygen Therapy:Patient Spontanous Breathing and Patient connected to face mask oxgen  Post-op Assessment:  Report given to PACU RN and Post -op Vital signs reviewed and stable  Post vital signs:  Reviewed and stable  Last Vitals:  Vitals:   07/05/16 0817 07/05/16 0911  BP: (!) 142/78 (!) 153/89  Pulse: 67 78  Resp: 14 16    Complications: No apparent anesthesia complications

## 2016-07-05 NOTE — Anesthesia Preprocedure Evaluation (Signed)
Anesthesia Evaluation  Patient identified by MRN, date of birth, ID band Patient awake    Reviewed: Allergy & Precautions, NPO status , Patient's Chart, lab work & pertinent test results  Airway Mallampati: II  TM Distance: >3 FB     Dental   Pulmonary    breath sounds clear to auscultation       Cardiovascular hypertension,  Rhythm:Regular Rate:Normal     Neuro/Psych  Headaches,    GI/Hepatic Neg liver ROS, History noted. CG   Endo/Other  negative endocrine ROS  Renal/GU negative Renal ROS     Musculoskeletal  (+) Arthritis ,   Abdominal   Peds  Hematology   Anesthesia Other Findings   Reproductive/Obstetrics                             Anesthesia Physical Anesthesia Plan  ASA: III  Anesthesia Plan: MAC   Post-op Pain Management:    Induction: Intravenous  Airway Management Planned: Simple Face Mask  Additional Equipment:   Intra-op Plan:   Post-operative Plan:   Informed Consent: I have reviewed the patients History and Physical, chart, labs and discussed the procedure including the risks, benefits and alternatives for the proposed anesthesia with the patient or authorized representative who has indicated his/her understanding and acceptance.   Dental advisory given  Plan Discussed with: CRNA and Anesthesiologist  Anesthesia Plan Comments:         Anesthesia Quick Evaluation

## 2016-07-06 ENCOUNTER — Other Ambulatory Visit: Payer: Self-pay | Admitting: Physical Medicine & Rehabilitation

## 2016-07-06 ENCOUNTER — Encounter (HOSPITAL_COMMUNITY): Payer: Self-pay | Admitting: Gastroenterology

## 2016-07-18 ENCOUNTER — Other Ambulatory Visit: Payer: Self-pay

## 2016-07-18 MED ORDER — LIDOCAINE 5 % EX PTCH: MEDICATED_PATCH | CUTANEOUS | 1 refills | Status: DC

## 2016-07-19 ENCOUNTER — Telehealth: Payer: Self-pay | Admitting: *Deleted

## 2016-07-19 NOTE — Telephone Encounter (Signed)
Patient left a message stating that at last visit with Dr. [name] it was discussed that lidoderm patches were going to be ordered for patients knee pain. She says it was supposed to be 2 patches per day, one for each knee. However, the order was placed for 1 patch per day.  Please advise

## 2016-07-20 MED ORDER — LIDOCAINE 5 % EX PTCH: MEDICATED_PATCH | CUTANEOUS | 1 refills | Status: DC

## 2016-07-20 NOTE — Telephone Encounter (Signed)
May increase to 2 patches per day #60 3 RF

## 2016-07-20 NOTE — Telephone Encounter (Signed)
Contacted patient's pharmacy, placed a new Rx for #60 patches with 1 refill.  Contacted patient and notified

## 2016-08-09 ENCOUNTER — Telehealth: Payer: Self-pay | Admitting: Genetic Counselor

## 2016-08-09 NOTE — Telephone Encounter (Signed)
Appt has been rescheduled for the pt to see Clydie BraunKaren on 6/11 at 9am.

## 2016-08-21 ENCOUNTER — Encounter: Payer: Self-pay | Admitting: Physical Medicine & Rehabilitation

## 2016-08-21 ENCOUNTER — Ambulatory Visit (HOSPITAL_BASED_OUTPATIENT_CLINIC_OR_DEPARTMENT_OTHER): Payer: 59 | Admitting: Physical Medicine & Rehabilitation

## 2016-08-21 ENCOUNTER — Encounter: Payer: 59 | Attending: Physical Medicine & Rehabilitation

## 2016-08-21 VITALS — BP 130/88 | HR 70

## 2016-08-21 DIAGNOSIS — M170 Bilateral primary osteoarthritis of knee: Secondary | ICD-10-CM

## 2016-08-21 MED ORDER — HYDROCODONE-ACETAMINOPHEN 5-325 MG PO TABS: 1.0000 | ORAL_TABLET | Freq: Every day | ORAL | 0 refills | Status: DC | PRN

## 2016-08-21 MED ORDER — BUPRENORPHINE HCL 75 MCG BU FILM: 150.0000 ug | ORAL_FILM | Freq: Two times a day (BID) | BUCCAL | 2 refills | Status: DC

## 2016-08-21 NOTE — Progress Notes (Signed)
Subjective:    Patient ID: Anita Booker, female    DOB: 03/15/1961, 56 y.o.   MRN: [id number]  HPI   Right  Knee injection Zilretta  helpful but cost excessive.    Synvisc 1 was used in past was helpful but most recent injections have worked the best.  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 Still uses about 1 half tablet of hydrocodone 5 mg per day for breakthrough Pain Inventory Average Pain 8 Pain Right Now 5 My pain is intermittent and sharp  In the last 24 hours, has pain interfered with the following? General activity 6 Relation with others 5 Enjoyment of life 6 What TIME of day is your pain at its worst? evening Sleep (in general) Fair  Pain is worse with: walking and standing Pain improves with: rest, medication and injections Relief from Meds: 6  Mobility walk without assistance walk with assistance do you drive?  yes  Function employed # of hrs/week 20  Neuro/Psych No problems in this area  Prior Studies Any changes since last visit?  no  Physicians involved in your care Any changes since last visit?  no   Family History  Problem Relation Age of Onset  . Lung cancer Father [id number]  . Cancer Father        lung  . Heart attack Brother 48  . Cancer Paternal Grandmother        breast   Social History   Social History  . Marital status: Married    Spouse name: N/A  . Number of children: N/A  . Years of education: N/A   Social History Main Topics  . Smoking status: Never Smoker  . Smokeless tobacco: Never Used  . Alcohol use No  . Drug use: No  . Sexual activity: Not on file   Other Topics Concern  . Not on file   Social History Narrative  . No narrative on file   Past Surgical History:  Procedure Laterality Date  . BREAST SURGERY  07/01/2011.   I & D of right breast abscess  . CHOLECYSTECTOMY  1995  . COLONOSCOPY WITH PROPOFOL N/A 07/05/2016   Procedure: COLONOSCOPY WITH  PROPOFOL;  Surgeon: Willis ModenaWilliam Outlaw, MD;  Location: [facility];  Service: Endoscopy;  Laterality: N/A;   Past Medical History:  Diagnosis Date  . Arthritis   . Complication of anesthesia 1995   woke up on table right after gallbladder surgery  . Family history of adverse reaction to anesthesia    sister coded twice with anesthesai not sure of details sister still living  . Headache    migraines  . Other and unspecified hyperlipidemia   . Unspecified essential hypertension    There were no vitals taken for this visit.  Opioid Risk Score:   Fall Risk Score:  `1  Depression screen PHQ 2/9  Depression screen [facility] 04/04/2016 07/15/2015 12/14/2014 08/04/2014 02/17/2014 12/09/2013  Decreased Interest 0 1 0 0 0 0  Down, Depressed, Hopeless 0 0 0 0 0 0  PHQ - 2 Score 0 1 0 0 0 0  Altered sleeping - 1 - - - -  Tired, decreased energy - 1 - - - -  Change in appetite - 0 - - - -  Feeling bad or failure about yourself  - 0 - - - -  Trouble concentrating - 0 - - - -  Moving slowly or fidgety/restless -  0 - - - -  Suicidal thoughts - 0 - - - -  PHQ-9 Score - 3 - - - -    Review of Systems  Constitutional: Negative.   HENT: Negative.   Eyes: Negative.   Respiratory: Negative.   Cardiovascular: Negative.   Gastrointestinal: Negative.   Endocrine: Negative.   Genitourinary: Negative.   Musculoskeletal: Negative.   Skin: Negative.   Allergic/Immunologic: Negative.   Neurological: Negative.   Hematological: Negative.   Psychiatric/Behavioral: Negative.   All other systems reviewed and are negative.      Objective:   Physical Exam  Constitutional: She is oriented to person, place, and time. She appears well-developed.  Morbid obesity  HENT:  Head: Normocephalic and atraumatic.  Eyes: Conjunctivae and EOM are normal. Pupils are equal, round, and reactive to light.  Musculoskeletal:       Right knee: She exhibits decreased range of motion. She exhibits no effusion. Tenderness  found. Medial joint line tenderness noted.       Left knee: She exhibits decreased range of motion. She exhibits no swelling and no effusion. Tenderness found. Medial joint line tenderness noted.  Neurological: She is alert and oriented to person, place, and time.  Psychiatric: She has a normal mood and affect.  Nursing note and vitals reviewed.         Assessment & Plan:  Bilateral knee osteoarthritis with decreased pain following long-acting corticosteroid injection.  She is tolerating buprenorphine buccal film Still has breakthrough pain will increase buprenorphine film to 150 g twice a day If patient does not get any further relief and is unable to discontinue hydrocodone may be able to substitute another breakthrough medication such as tramadol

## 2016-08-21 NOTE — Patient Instructions (Signed)
Please discuss pharmacy charge with Wadie LessenLisa Kellner

## 2016-08-28 ENCOUNTER — Ambulatory Visit (HOSPITAL_BASED_OUTPATIENT_CLINIC_OR_DEPARTMENT_OTHER): Payer: 59 | Admitting: Genetic Counselor

## 2016-08-28 ENCOUNTER — Other Ambulatory Visit: Payer: 59

## 2016-08-28 ENCOUNTER — Encounter: Payer: Self-pay | Admitting: Genetic Counselor

## 2016-08-28 DIAGNOSIS — Z8049 Family history of malignant neoplasm of other genital organs: Secondary | ICD-10-CM | POA: Diagnosis not present

## 2016-08-28 DIAGNOSIS — Z7183 Encounter for nonprocreative genetic counseling: Secondary | ICD-10-CM | POA: Diagnosis not present

## 2016-08-28 DIAGNOSIS — Z803 Family history of malignant neoplasm of breast: Secondary | ICD-10-CM | POA: Diagnosis not present

## 2016-08-28 NOTE — Progress Notes (Signed)
REFERRING PROVIDER: Briscoe Deutscher, MD [address] [place], [place] [address]  PRIMARY PROVIDER:  Briscoe Deutscher, MD  PRIMARY REASON FOR VISIT:  1. Family history of breast cancer   2. Family history of uterine cancer      HISTORY OF PRESENT ILLNESS:   Anita Booker, a 56 y.o. female, was seen for a [facility] cancer genetics consultation at the request of Dr. Maceo Pro due to a family history of cancer.  Anita Booker presents to clinic today to discuss the possibility of a hereditary predisposition to cancer, genetic testing, and to further clarify her future cancer risks, as well as potential cancer risks for family members. Anita Booker is a 56 y.o. female with no personal history of cancer.    CANCER HISTORY:   No history exists.     HORMONAL RISK FACTORS:  Menarche was at age 60-13.  First live birth at age 69.  OCP use for approximately <5 years.  Ovaries intact: yes.  Hysterectomy: no.  Menopausal status: postmenopausal.  HRT use: 0 years. Colonoscopy: yes; normal. Mammogram within the last year: yes. Number of breast biopsies: 0. Up to date with pelvic exams:  no. Any excessive radiation exposure in the past:  no  Past Medical History:  Diagnosis Date  . Arthritis   . Complication of anesthesia 1995   woke up on table right after gallbladder surgery  . Family history of adverse reaction to anesthesia    sister coded twice with anesthesai not sure of details sister still living  . Family history of breast cancer   . Family history of uterine cancer   . Headache    migraines  . Other and unspecified hyperlipidemia   . Unspecified essential hypertension     Past Surgical History:  Procedure Laterality Date  . BREAST SURGERY  07/01/2011.   I & D of right breast abscess  . CHOLECYSTECTOMY  1995  . COLONOSCOPY WITH PROPOFOL N/A 07/05/2016   Procedure: COLONOSCOPY WITH PROPOFOL;  Surgeon: Arta Silence, MD;  Location: [facility];  Service: Endoscopy;   Laterality: N/A;    Social History   Social History  . Marital status: Married    Spouse name: N/A  . Number of children: N/A  . Years of education: N/A   Social History Main Topics  . Smoking status: Never Smoker  . Smokeless tobacco: Never Used  . Alcohol use No  . Drug use: No  . Sexual activity: Not Asked   Other Topics Concern  . None   Social History Narrative  . None     FAMILY HISTORY:  We obtained a detailed, 4-generation family history.  Significant diagnoses are listed below: Family History  Problem Relation Age of Onset  . Lung cancer Father 84  . Cancer Father        lung  . Healthy Brother   . Uterine cancer Mother 53  . Heart attack Brother 11  . Cancer Paternal Grandmother        breast  . Breast cancer Sister 92       identical twin  . Lung cancer Paternal Uncle        all four uncles had lung cancer and were smokers  . Healthy Sister   . Breast cancer Other   . Breast cancer Cousin        paternal cousin dx in her 30s  . Liver cancer Cousin        paternal cousin with hepatitis and liver  cancer    The patient has three daughters and two sons who are all cancer free.  She has four sisters and two brothers.  Her identical twin sister was diagnosed with breast cancer at age 57, and a brother died of a heart attack at age 80.  The patient's mother had uterine cancer at 41.  Her mother had four full siblings and 9 paternal half siblings.  None who had cancer.  There is no other cancer history on the maternal side of the family.  The patient's father had lung cancer in the face of smoking.  He had four brothers and one sister. All four brothers died of lung cancer with a smoking history.  One brother had a daughter who had breast cancer and a son who had liver cancer after contracting hepatitis.  The patient's paternal grandmother had breast cancer and her grandfather had Alzheimer's and a stroke.  Her grandmother's mother also had breast cancer.  Anita Booker is unaware of previous family history of genetic testing for hereditary cancer risks. Patient's maternal ancestors are of [place] descent, and paternal ancestors are of Caucasian descent. There is no reported Ashkenazi Jewish ancestry. There is no known consanguinity.  GENETIC COUNSELING ASSESSMENT: Anita Booker is a 56 y.o. female with a family history of breast cancer which is somewhat suggestive of a hereditary cancer syndrome and predisposition to cancer. We, therefore, discussed and recommended the following at today's visit.   DISCUSSION: We discussed that about 5-10% of breast cancer is hereditary with most cases due to BRCA mutations.  Other hereditary genes associated with breast cancer syndromes include ATM, CHEK2 and PALB2.  We discussed that her sister would be the best person to test since she had breast cancer, realizing that the patient is an identical twin of her sister.  The patient meets NCCN criteria for genetic testing, but does not meet medical criteria for testing based on AETNA criteria.    The patient's husband works at [employer], and therefore can undergo free genetic testing there.  We discussed that [facility] is not a preferred lab by our office and the reasons why (smaller, higher VUS rate, etc..).  We discussed out of pocket costs and that [facility] will charge $250 for genetic testing.  If she is positive for a hereditary cancer syndrome, first degree relatives could undergo genetic testing for free.  The patient decided to undergo testing and pay out of pocket. We reviewed the characteristics, features and inheritance patterns of hereditary cancer syndromes. We also discussed genetic testing, including the appropriate family members to test, the process of testing, insurance coverage and turn-around-time for results. We discussed the implications of a negative, positive and/or variant of uncertain significant result. We recommended Anita Booker pursue genetic testing for  the Multi- gene panel. The Multi-Gene Panel offered by [facility] includes sequencing and/or deletion duplication testing of the following 80 genes: ALK, APC, ATM, AXIN2,BAP1,  BARD1, BLM, BMPR1A, BRCA1, BRCA2, BRIP1, CASR, CDC73, CDH1, CDK4, CDKN1B, CDKN1C, CDKN2A (p14ARF), CDKN2A (p16INK4a), CEBPA, CHEK2, CTNNA1, DICER1, DIS3L2, EGFR (c.2369C>T, p.Thr790Met variant only), EPCAM (Deletion/duplication testing only), FH, FLCN, GATA2, GPC3, GREM1 (Promoter region deletion/duplication testing only), HOXB13 (c.251G>A, p.Gly84Glu), HRAS, KIT, MAX, MEN1, MET, MITF (c.952G>A, p.Glu318Lys variant only), MLH1, MSH2, MSH3, MSH6, MUTYH, NBN, NF1, NF2, NTHL1, PALB2, PDGFRA, PHOX2B, PMS2, POLD1, POLE, POT1, PRKAR1A, PTCH1, PTEN, RAD50, RAD51C, RAD51D, RB1, RECQL4, RET, RUNX1, SDHAF2, SDHA (sequence changes only), SDHB, SDHC, SDHD, SMAD4, SMARCA4, SMARCB1, SMARCE1, STK11, SUFU, TERT, TERT, TMEM127, TP53, TSC1,  TSC2, VHL, WRN and WT1.    PLAN: After considering the risks, benefits, and limitations, Anita Booker  provided informed consent to pursue genetic testing and the blood sample was sent to [facility] for analysis of the Multi-gene panel. Results should be available within approximately 2-3 weeks' time, at which point they will be disclosed by telephone to Anita Booker, as will any additional recommendations warranted by these results. Anita Booker will receive a summary of her genetic counseling visit and a copy of her results once available. This information will also be available in Epic. We encouraged Anita Booker to remain in contact with cancer genetics annually so that we can continuously update the family history and inform her of any changes in cancer genetics and testing that may be of benefit for her family. Anita Booker's questions were answered to her satisfaction today. Our contact information was provided should additional questions or concerns arise.  Based on Anita Booker's family history, we  recommended her sister, Dyann Ruddle, who was diagnosed with breast cancer at age 21, have genetic counseling and testing. Anita Booker will let us know if we can be of any assistance in coordinating genetic counseling and/or testing for this family member.   Lastly, we encouraged Anita Booker to remain in contact with cancer genetics annually so that we can continuously update the family history and inform her of any changes in cancer genetics and testing that may be of benefit for this family.   Ms.  Booker's questions were answered to her satisfaction today. Our contact information was provided should additional questions or concerns arise. Thank you for the referral and allowing [place] to share in the care of your patient.   Bernice Mullin P. Florene Glen, [place], [facility] Certified Genetic Counselor [email or web address] phone: [phone]  The patient was seen for a total of 45 minutes in face-to-face genetic counseling.  This patient was discussed with Drs. Magrinat, Lindi Adie and/or Burr Medico who agrees with the above.    _______________________________________________________________________ For Office Staff:  Number of people involved in session: 1 Was an Intern/ student involved with case: no

## 2016-08-30 ENCOUNTER — Telehealth: Payer: Self-pay | Admitting: *Deleted

## 2016-08-30 ENCOUNTER — Telehealth: Payer: Self-pay | Admitting: Physical Medicine & Rehabilitation

## 2016-08-30 MED ORDER — BUPRENORPHINE HCL 150 MCG BU FILM: 1.0000 | ORAL_FILM | Freq: Two times a day (BID) | BUCCAL | 1 refills | Status: DC

## 2016-08-30 NOTE — Telephone Encounter (Signed)
Patient called back regarding cost of zilretta - I advised charge for rx is correct for dosage and rx - she will let AK know it helped but may not be able to afford coinsurance to her.

## 2016-08-30 NOTE — Telephone Encounter (Signed)
Dose on Belbuca was increased to 150 mcg but she was only given #60 of the 75 mcg films which was 15 days..   Called new dose to the pharmacy changing the mcg to 150 #60 and Rocky LinkKen will do a new P/A with insurance in case it is required. Consuella LoseElaine notified.

## 2016-09-07 ENCOUNTER — Encounter: Payer: Self-pay | Admitting: Genetic Counselor

## 2016-09-07 ENCOUNTER — Ambulatory Visit: Payer: Self-pay | Admitting: Genetic Counselor

## 2016-09-07 ENCOUNTER — Telehealth: Payer: Self-pay | Admitting: Genetic Counselor

## 2016-09-07 DIAGNOSIS — Z803 Family history of malignant neoplasm of breast: Secondary | ICD-10-CM

## 2016-09-07 DIAGNOSIS — Z1379 Encounter for other screening for genetic and chromosomal anomalies: Secondary | ICD-10-CM

## 2016-09-07 DIAGNOSIS — Z8049 Family history of malignant neoplasm of other genital organs: Secondary | ICD-10-CM

## 2016-09-07 HISTORY — DX: Encounter for other screening for genetic and chromosomal anomalies: Z13.79

## 2016-09-07 NOTE — Telephone Encounter (Signed)
Revealed negative genetic testing.  Discussed that we do not know why there is cancer in the family. It could be due to a different gene that we are not testing, or maybe our current technology may not be able to pick something up.  It will be important for her to keep in contact with genetics to keep up with whether additional testing may be needed.  Based on tyrer cusik she is at elevated risk for breast cancer, but it is not high enough to warrant breast MRI.

## 2016-09-07 NOTE — Progress Notes (Signed)
HPI: Anita Booker was previously seen in the [place] clinic due to a family history of cancer and concerns regarding a hereditary predisposition to cancer. Please refer to our prior cancer genetics clinic note for more information regarding Anita Booker's medical, social and family histories, and our assessment and recommendations, at the time. Anita Booker's recent genetic test results were disclosed to her, as were recommendations warranted by these results. These results and recommendations are discussed in more detail below.  CANCER HISTORY:   No history exists.    FAMILY HISTORY:  We obtained a detailed, 4-generation family history.  Significant diagnoses are listed below: Family History  Problem Relation Age of Onset  . Lung cancer Father 30  . Cancer Father        lung  . Healthy Brother   . Uterine cancer Mother 51  . Heart attack Brother 84  . Cancer Paternal Grandmother        breast  . Breast cancer Sister 61       identical twin  . Lung cancer Paternal Uncle        all four uncles had lung cancer and were smokers  . Healthy Sister   . Breast cancer Other   . Breast cancer Cousin        paternal cousin dx in her 92s  . Liver cancer Cousin        paternal cousin with hepatitis and liver cancer    The patient has three daughters and two sons who are all cancer free.  She has four sisters and two brothers.  Her identical twin sister was diagnosed with breast cancer at age 53, and a brother died of a heart attack at age 36.  The patient's mother had uterine cancer at 12.  Her mother had four full siblings and 9 paternal half siblings.  None who had cancer.  There is no other cancer history on the maternal side of the family.  The patient's father had lung cancer in the face of smoking.  He had four brothers and one sister. All four brothers died of lung cancer with a smoking history.  One brother had a daughter who had breast cancer and a son who had  liver cancer after contracting hepatitis.  The patient's paternal grandmother had breast cancer and her grandfather had Alzheimer's and a stroke.  Her grandmother's mother also had breast cancer.  Ms. Colin Ina is unaware of previous family history of genetic testing for hereditary cancer risks. Patient's maternal ancestors are of [place] descent, and paternal ancestors are of Caucasian descent. There is no reported Ashkenazi Jewish ancestry. There is no known consanguinity.  GENETIC TEST RESULTS: Genetic testing reported out on September 04, 2016 through the Multi-gene cancer panel found no deleterious mutations.  The Multi-Gene Panel offered by [facility] includes sequencing and/or deletion duplication testing of the following 80 genes: ALK, APC, ATM, AXIN2,BAP1,  BARD1, BLM, BMPR1A, BRCA1, BRCA2, BRIP1, CASR, CDC73, CDH1, CDK4, CDKN1B, CDKN1C, CDKN2A (p14ARF), CDKN2A (p16INK4a), CEBPA, CHEK2, CTNNA1, DICER1, DIS3L2, EGFR (c.2369C>T, p.Thr790Met variant only), EPCAM (Deletion/duplication testing only), FH, FLCN, GATA2, GPC3, GREM1 (Promoter region deletion/duplication testing only), HOXB13 (c.251G>A, p.Gly84Glu), HRAS, KIT, MAX, MEN1, MET, MITF (c.952G>A, p.Glu318Lys variant only), MLH1, MSH2, MSH3, MSH6, MUTYH, NBN, NF1, NF2, NTHL1, PALB2, PDGFRA, PHOX2B, PMS2, POLD1, POLE, POT1, PRKAR1A, PTCH1, PTEN, RAD50, RAD51C, RAD51D, RB1, RECQL4, RET, RUNX1, SDHAF2, SDHA (sequence changes only), SDHB, SDHC, SDHD, SMAD4, SMARCA4, SMARCB1, SMARCE1, STK11, SUFU, TERT, TERT, TMEM127, TP53, TSC1, TSC2, VHL,  WRN and WT1.  The test report has been scanned into EPIC and is located under the Molecular Pathology section of the Results Review tab.   We discussed with Anita Booker that since the current genetic testing is not perfect, it is possible there may be a gene mutation in one of these genes that current testing cannot detect, but that chance is small. We also discussed, that it is possible that another gene that has not yet  been discovered, or that we have not yet tested, is responsible for the cancer diagnoses in the family, and it is, therefore, important to remain in touch with cancer genetics in the future so that we can continue to offer Anita Booker the most up to date genetic testing.     CANCER SCREENING RECOMMENDATIONS:  This normal result is reassuring and indicates that Anita Booker does not likely have an increased risk of cancer due to a mutation in one of these genes.  We, therefore, recommended  Anita Booker continue to follow the cancer screening guidelines provided by her primary healthcare providers.   Based on the Anita Booker's personal and family history of cancer, as well as her genetic test results, statistical models (Tyrer Cusik)  and literature data were used to estimate her risk of developing breast cancer. This estimates her lifetime risk of developing breast cancer to be approximately 18.6%.  The patient's lifetime breast cancer risk is a preliminary estimate based on available information using one of several models endorsed by the [place] (ACS). The ACS recommends consideration of breast MRI screening as an adjunct to mammography for patients at high risk (defined as 20% or greater lifetime risk). A more detailed breast cancer risk assessment can be considered, if clinically indicated.     RECOMMENDATIONS FOR FAMILY MEMBERS: Women in this family might be at some increased risk of developing cancer, over the general population risk, simply due to the family history of cancer. We recommended women in this family have a yearly mammogram beginning at age 6, or 98 years younger than the earliest onset of cancer, an annual clinical breast exam, and perform monthly breast self-exams. Women in this family should also have a gynecological exam as recommended by their primary provider. All family members should have a colonoscopy by age 23.  FOLLOW-UP: Lastly, we discussed with  Anita Booker that cancer genetics is a rapidly advancing field and it is possible that new genetic tests will be appropriate for her and/or her family members in the future. We encouraged her to remain in contact with cancer genetics on an annual basis so we can update her personal and family histories and let her know of advances in cancer genetics that may benefit this family.   Our contact number was provided. Anita Booker's questions were answered to her satisfaction, and she knows she is welcome to call us at anytime with additional questions or concerns.   Roma Kayser, MS, [facility] Certified Genetic Counselor [email or web address]

## 2016-09-25 ENCOUNTER — Other Ambulatory Visit: Payer: Self-pay | Admitting: Physical Medicine & Rehabilitation

## 2016-09-28 ENCOUNTER — Encounter: Payer: Self-pay | Admitting: Physical Medicine & Rehabilitation

## 2016-09-28 ENCOUNTER — Ambulatory Visit (HOSPITAL_BASED_OUTPATIENT_CLINIC_OR_DEPARTMENT_OTHER): Payer: 59 | Admitting: Physical Medicine & Rehabilitation

## 2016-09-28 ENCOUNTER — Encounter: Payer: 59 | Attending: Physical Medicine & Rehabilitation

## 2016-09-28 VITALS — BP 143/93 | HR 87

## 2016-09-28 DIAGNOSIS — G894 Chronic pain syndrome: Secondary | ICD-10-CM

## 2016-09-28 DIAGNOSIS — Z79899 Other long term (current) drug therapy: Secondary | ICD-10-CM

## 2016-09-28 DIAGNOSIS — Z5181 Encounter for therapeutic drug level monitoring: Secondary | ICD-10-CM

## 2016-09-28 DIAGNOSIS — M170 Bilateral primary osteoarthritis of knee: Secondary | ICD-10-CM | POA: Diagnosis not present

## 2016-09-28 HISTORY — DX: Chronic pain syndrome: G89.4

## 2016-09-28 MED ORDER — BUPRENORPHINE HCL 75 MCG BU FILM: 75.0000 ug | ORAL_FILM | Freq: Two times a day (BID) | BUCCAL | 2 refills | Status: DC

## 2016-09-28 MED ORDER — TRAMADOL HCL 50 MG PO TABS: 50.0000 mg | ORAL_TABLET | Freq: Two times a day (BID) | ORAL | 2 refills | Status: DC

## 2016-09-28 NOTE — Progress Notes (Signed)
Subjective:    Patient ID: Anita Booker, female    DOB: 07/21/1960, 56 y.o.   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 Oral swab drug screen done  Pain Inventory Average Pain 8 Pain Right Now 9 My pain is constant  In the last 24 hours, has pain interfered with the following? General activity 8 Relation with others 8 Enjoyment of life 8 What TIME of day is your pain at its worst? evening Sleep (in general) Poor  Pain is worse with: walking and standing Pain improves with: rest, medication and injections Relief from Meds: 5  Mobility walk without assistance ability to climb steps?  yes do you drive?  yes  Function employed # of hrs/week .  Neuro/Psych trouble walking  Prior Studies Any changes since last visit?  no  Physicians involved in your care Any changes since last visit?  no   Family History  Problem Relation Age of Onset  . Lung cancer Father [id number]  . Cancer Father        lung  . Healthy Brother   . Uterine cancer Mother 57  . Heart attack Brother 48  . Cancer Paternal Grandmother        breast  . Breast cancer Sister 57       identical twin  . Lung cancer Paternal Uncle        all four uncles had lung cancer and were smokers  . Healthy Sister   . Breast cancer Other   . Breast cancer Cousin        paternal cousin dx in her 4160s  . Liver cancer Cousin        paternal cousin with hepatitis and liver cancer   Social History   Social History  . Marital status: Married    Spouse name: N/A  . Number of children: N/A  . Years of education: N/A   Social History Main Topics  . Smoking status: Never Smoker  . Smokeless tobacco: Never Used  . Alcohol use No  . Drug use: No  . Sexual  activity: Not on file   Other Topics Concern  . Not on file   Social History Narrative  . No narrative on file   Past Surgical History:  Procedure Laterality Date  . BREAST SURGERY  07/01/2011.   I & D of right breast abscess  . CHOLECYSTECTOMY  1995  . COLONOSCOPY WITH PROPOFOL N/A 07/05/2016   Procedure: COLONOSCOPY WITH PROPOFOL;  Surgeon: Willis ModenaWilliam Outlaw, MD;  Location: [facility];  Service: Endoscopy;  Laterality: N/A;   Past Medical History:  Diagnosis Date  . Arthritis   . Complication of anesthesia 1995   woke up on table right after gallbladder surgery  . Family history of adverse reaction to anesthesia    sister coded twice with anesthesai not sure of details sister still living  . Family history of breast cancer   . Family history of uterine cancer   . Headache    migraines  . Other and unspecified hyperlipidemia   . Unspecified essential hypertension    There were no vitals taken for this visit.  Opioid Risk Score:   Fall Risk Score:  `1  Depression screen PHQ  2/9  Depression screen PHQ 2/9 04/04/2016 07/15/2015 12/14/2014 08/04/2014 02/17/2014 12/09/2013  Decreased Interest 0 1 0 0 0 0  Down, Depressed, Hopeless 0 0 0 0 0 0  PHQ - 2 Score 0 1 0 0 0 0  Altered sleeping - 1 - - - -  Tired, decreased energy - 1 - - - -  Change in appetite - 0 - - - -  Feeling bad or failure about yourself  - 0 - - - -  Trouble concentrating - 0 - - - -  Moving slowly or fidgety/restless - 0 - - - -  Suicidal thoughts - 0 - - - -  PHQ-9 Score - 3 - - - -   143  Review of Systems  Constitutional: Negative.   HENT: Negative.   Eyes: Negative.   Respiratory: Negative.   Cardiovascular: Negative.   Gastrointestinal: Negative.   Endocrine: Negative.   Genitourinary: Negative.   Musculoskeletal: Negative.   Skin: Negative.   Allergic/Immunologic: Negative.   Neurological: Negative.   Hematological: Negative.   Psychiatric/Behavioral: Negative.   All other systems  reviewed and are negative.      Objective:   Physical Exam  Constitutional: She is oriented to person, place, and time. She appears well-developed.  Morbidly obese  HENT:  Head: Normocephalic and atraumatic.  Eyes: Pupils are equal, round, and reactive to light. Conjunctivae and EOM are normal.  Neurological: She is alert and oriented to person, place, and time.  Ambulates without assisted device. Wide-based gait due to IV hepatitis.  Psychiatric: Her affect is blunt.  Nursing note and vitals reviewed.   Right knee without evidence of effusion. There is no evidence of erythema. No pain with range of motion. Flexion limited by body habitus      Assessment & Plan:  1. Chronic bilateral knee osteoarthritis She remains functional on narcotic analgesics without side effects of drowsiness or falls, or constipation She had no increased pain relief with the higher dose of buprenorphine. Buccal film. Will reduce to 75 g every 12 hours  Substitute tramadol 50 mg 1-2 tablets per day in place of hydrocodone 5 mg per day  Repeat knee injection with Celestone   Knee injection Bilateral with ultrasound guidance)  Indication: Bilateral Knee pain not relieved by medication management and other conservative care.  Informed consent was obtained after describing risks and benefits of the procedure with the patient, this includes bleeding, bruising, infection and medication side effects. The patient wishes to proceed and has given written consent. The patient was placed in a recumbent position. The medial aspect of the knee was marked and prepped with Betadine and alcohol. It was then entered with a 25-gauge 1-1/2 inch needle and 1 mL of 1% lidocaine was injected into the skin and subcutaneous tissue. Then another 22g 80mm ECHO blocneedle was inserted into the knee joint. After negative draw back for blood, a solution containing one ML of 6mg  per mL betamethasone and 3 mL of 1% lidocaine were  injected. The patient tolerated the procedure well. Post procedure instructions were given.

## 2016-09-28 NOTE — Patient Instructions (Signed)
Did Celestone injections rather than the Zilretta since she did not report any longer duration of effect.  We will substitute tramadol 2 tablets in place of hydrocodone. Please call if this is not effective. Otherwise, we can see you on a every 3 month basis with medication renewals plus injections.

## 2016-10-05 LAB — DRUG TOX MONITOR 1 W/CONF, ORAL FLD
Amphetamines: NEGATIVE ng/mL (ref <10)
BUPRENORPHINE: POSITIVE ng/mL — AB (ref <0.025)
Barbiturates: NEGATIVE ng/mL (ref <10)
Benzodiazepines: NEGATIVE ng/mL (ref <0.50)
Buprenorphine: 5.364 ng/mL — ABNORMAL HIGH (ref <0.025)
CODEINE: NEGATIVE ng/mL (ref <2.5)
Cocaine: NEGATIVE ng/mL (ref <2.5)
Dihydrocodeine: NEGATIVE ng/mL (ref <2.5)
Fentanyl: NEGATIVE ng/mL (ref <0.10)
HEROIN METABOLITE: NEGATIVE ng/mL (ref <1.0)
HYDROCODONE: 5.1 ng/mL — AB (ref <2.5)
HYDROMORPHONE: NEGATIVE ng/mL (ref <2.5)
MDMA: NEGATIVE ng/mL (ref <10)
METHADONE: NEGATIVE ng/mL (ref <5.0)
MORPHINE: NEGATIVE ng/mL (ref <2.5)
Marijuana: NEGATIVE ng/mL (ref <2.5)
Meperidine: NEGATIVE ng/mL (ref <5.0)
Meprobamate: NEGATIVE ng/mL (ref <2.5)
NALOXONE: NEGATIVE ng/mL (ref <0.10)
NICOTINE METABOLITE: NEGATIVE ng/mL (ref <5.0)
NORBUPRENORPHINE: NEGATIVE ng/mL (ref <0.25)
NOROXYCODONE: NEGATIVE ng/mL (ref <2.5)
Norhydrocodone: NEGATIVE ng/mL (ref <2.5)
OXYCODONE: NEGATIVE ng/mL (ref <2.5)
Opiates: POSITIVE ng/mL — AB (ref <2.5)
Oxymorphone: NEGATIVE ng/mL (ref <2.5)
Phencyclidine: NEGATIVE ng/mL (ref <10)
Propoxyphene: NEGATIVE ng/mL (ref <5.0)
TAPENTADOL: NEGATIVE ng/mL (ref <5.0)
Tramadol: NEGATIVE ng/mL (ref <5.0)
ZOLPIDEM: NEGATIVE ng/mL (ref <5.0)

## 2016-10-05 LAB — DRUG TOX ALC METAB W/CON, ORAL FLD: Alcohol Metabolite: NEGATIVE ng/mL (ref <25)

## 2016-10-10 ENCOUNTER — Telehealth: Payer: Self-pay | Admitting: *Deleted

## 2016-10-10 NOTE — Telephone Encounter (Signed)
Oral swab drug screen was consistent for prescribed medications.  ?

## 2016-12-11 ENCOUNTER — Ambulatory Visit (INDEPENDENT_AMBULATORY_CARE_PROVIDER_SITE_OTHER): Payer: 59 | Admitting: Sports Medicine

## 2016-12-11 VITALS — BP 127/71 | Ht 62.0 in | Wt 282.0 lb

## 2016-12-11 DIAGNOSIS — M170 Bilateral primary osteoarthritis of knee: Secondary | ICD-10-CM | POA: Diagnosis not present

## 2016-12-11 DIAGNOSIS — M25511 Pain in right shoulder: Secondary | ICD-10-CM

## 2016-12-11 NOTE — Progress Notes (Signed)
   Subjective:    Patient ID: Anita Booker, female    DOB: 01/20/1961, 56 y.o.   MRN: [id number]  HPI chief complaint: Right shoulder pain, left hip pain, bilateral knee pain  Patient comes in today with several different complaints. She has a well-documented history of end-stage knee DJD. She is currently under the care of Dr.Kirsteins for pain management. Knees were last injected with cortisone back in July. She is becoming more and more immobile. Her morbid obesity precludes her from total knee arthroplasty. She's complaining of 2 weeks of right shoulder pain that began without any known trauma. She has a new grandbaby and has been picking him up quite a bit. Pain is along the lateral shoulder with radiating pain to the elbow. Only nighttime pain is if she sleeps flat on her back. No numbness or tingling. She is complaining of intermittent lateral left hip pain. No trauma. No groin pain. She thinks that it is secondary from limping from her knee pain.    Review of Systems As above    Objective:   Physical Exam  Morbidly obese. No acute distress  Right shoulder: Full range of motion. Positive painful arc. Positive Hawkins. Rotator cuff strength is 5/5.  Left hip: Smooth painless hip range of motion with a negative logroll. Diffuse tenderness along the lateral hip.  Examination of both knees is limited by body habitus. Range of motion is 0-100. No obvious effusion. She is tender to palpation along the anterior medial joint line.      Assessment & Plan:   Right shoulder pain secondary to rotator cuff impingement Diffuse left hip pain Bilateral knee pain secondary to advanced DJD Morbid obesity  Patient is shown some Jobe exercises to do at home for her right shoulder pain. I offer her an IM Depo-Medrol injection for her hip pain but she refused. She will follow-up with Dr. [name] for her chronic knee pain. I will inquire about whether or not there are any orthopedists  at the academic institutions [facility], or [facility]), that may consider a total knee arthroplasty despite her morbid obesity. I we'll contact her with that information if available.

## 2016-12-28 ENCOUNTER — Ambulatory Visit: Payer: 59 | Admitting: Physical Medicine & Rehabilitation

## 2016-12-28 ENCOUNTER — Ambulatory Visit (HOSPITAL_BASED_OUTPATIENT_CLINIC_OR_DEPARTMENT_OTHER): Payer: 59 | Admitting: Physical Medicine & Rehabilitation

## 2016-12-28 ENCOUNTER — Encounter: Payer: 59 | Attending: Physical Medicine & Rehabilitation

## 2016-12-28 ENCOUNTER — Other Ambulatory Visit: Payer: Self-pay

## 2016-12-28 ENCOUNTER — Encounter: Payer: Self-pay | Admitting: Physical Medicine & Rehabilitation

## 2016-12-28 VITALS — BP 130/90 | HR 93 | Resp 14

## 2016-12-28 DIAGNOSIS — M170 Bilateral primary osteoarthritis of knee: Secondary | ICD-10-CM

## 2016-12-28 MED ORDER — BUPRENORPHINE HCL 75 MCG BU FILM: 75.0000 ug | ORAL_FILM | Freq: Two times a day (BID) | BUCCAL | 2 refills | Status: DC

## 2016-12-28 MED ORDER — TRAMADOL HCL 50 MG PO TABS: 50.0000 mg | ORAL_TABLET | Freq: Two times a day (BID) | ORAL | 2 refills | Status: DC

## 2016-12-28 NOTE — Telephone Encounter (Signed)
Printed on wrong paper

## 2016-12-28 NOTE — Progress Notes (Signed)
Knee injection Bilateral with ultrasound guidance)  Indication: Bilateral Knee pain not relieved by medication management and other conservative care.  Informed consent was obtained after describing risks and benefits of the procedure with the patient, this includes bleeding, bruising, infection and medication side effects. The patient wishes to proceed and has given written consent. The patient was placed in a recumbent position. The medial aspect of the Right  knee was marked and prepped with Betadine and alcohol. It was then entered with a 25-gauge 1-1/2 inch needle and 1 mL of 1% lidocaine was injected into the skin and subcutaneous tissue. Then another 22g 80mm ECHO blocneedle was inserted into the knee joint. After negative draw back for blood, a solution containing one ML of  per mL betamethasone and 3 mL of 1% lidocaine were injected. The same procedure was performed on the left knee using same technique equipment and medication. The patient tolerated the procedure well. Post procedure instructions were given.

## 2016-12-28 NOTE — Patient Instructions (Signed)
You received bilateral knee injections with betamethasone and lidocaine today. No activity restrictions. You may ice her knees for 20 minutes every couple hours as needed. If there is postprocedure soreness

## 2017-01-15 ENCOUNTER — Telehealth: Payer: Self-pay | Admitting: *Deleted

## 2017-01-15 NOTE — Telephone Encounter (Signed)
Prior auth for Belbuca 75 mcg [facility] and [facility] 08/24/2016-08-24-2017

## 2017-01-25 ENCOUNTER — Other Ambulatory Visit: Payer: Self-pay | Admitting: *Deleted

## 2017-01-25 MED ORDER — CELECOXIB 200 MG PO CAPS: 200.0000 mg | ORAL_CAPSULE | Freq: Every day | ORAL | 2 refills | Status: DC

## 2017-02-01 ENCOUNTER — Telehealth: Payer: Self-pay

## 2017-02-01 NOTE — Telephone Encounter (Signed)
Notes sent to scheduling.   

## 2017-02-07 ENCOUNTER — Other Ambulatory Visit: Payer: Self-pay | Admitting: *Deleted

## 2017-02-07 MED ORDER — LIDOCAINE 5 % EX PTCH: MEDICATED_PATCH | CUTANEOUS | 1 refills | Status: DC

## 2017-02-13 ENCOUNTER — Other Ambulatory Visit: Payer: Self-pay

## 2017-02-13 ENCOUNTER — Encounter: Payer: Self-pay | Admitting: Cardiology

## 2017-02-13 ENCOUNTER — Ambulatory Visit (INDEPENDENT_AMBULATORY_CARE_PROVIDER_SITE_OTHER): Payer: 59 | Admitting: Cardiology

## 2017-02-13 VITALS — BP 112/80 | HR 80 | Ht 62.0 in | Wt 297.0 lb

## 2017-02-13 DIAGNOSIS — R079 Chest pain, unspecified: Secondary | ICD-10-CM | POA: Diagnosis not present

## 2017-02-13 DIAGNOSIS — E782 Mixed hyperlipidemia: Secondary | ICD-10-CM | POA: Diagnosis not present

## 2017-02-13 DIAGNOSIS — I10 Essential (primary) hypertension: Secondary | ICD-10-CM

## 2017-02-13 HISTORY — DX: Essential (primary) hypertension: I10

## 2017-02-13 HISTORY — DX: Mixed hyperlipidemia: E78.2

## 2017-02-13 HISTORY — DX: Chest pain, unspecified: R07.9

## 2017-02-13 HISTORY — DX: Morbid (severe) obesity due to excess calories: E66.01

## 2017-02-13 MED ORDER — NITROGLYCERIN 0.4 MG SL SUBL: 0.4000 mg | SUBLINGUAL_TABLET | SUBLINGUAL | 6 refills | Status: DC | PRN

## 2017-02-13 NOTE — Patient Instructions (Addendum)
Medication Instructions:  Your physician has recommended you make the following change in your medication:  START Nitro as needed for chest pain  Labwork: None  Testing/Procedures: Your physician has requested that you have a lexiscan myoview. For further information please visit [email or web address]. Please follow instruction sheet, as given.  Follow-Up: Your physician recommends that you schedule a follow-up appointment in: 2 months   Any Other Special Instructions Will Be Listed Below (If Applicable).     If you need a refill on your cardiac medications before your next appointment, please call your pharmacy.   [facility]  Garey HamAshley A, RN, BSN  Nitroglycerin sublingual tablets What is this medicine? NITROGLYCERIN (nye troe GLI ser in) is a type of vasodilator. It relaxes blood vessels, increasing the blood and oxygen supply to your heart. This medicine is used to relieve chest pain caused by angina. It is also used to prevent chest pain before activities like climbing stairs, going outdoors in cold weather, or sexual activity. This medicine may be used for other purposes; ask your health care provider or pharmacist if you have questions. COMMON BRAND NAME(S): Nitroquick, Nitrostat, Nitrotab What should I tell my health care provider before I take this medicine? They need to know if you have any of these conditions: -anemia -head injury, recent stroke, or bleeding in the brain -liver disease -previous heart attack -an unusual or allergic reaction to nitroglycerin, other medicines, foods, dyes, or preservatives -pregnant or trying to get pregnant -breast-feeding How should I use this medicine? Take this medicine by mouth as needed. At the first sign of an angina attack (chest pain or tightness) place one tablet under your tongue. You can also take this medicine 5 to 10 minutes before an event likely to produce chest pain. Follow the directions on the prescription label. Let  the tablet dissolve under the tongue. Do not swallow whole. Replace the dose if you accidentally swallow it. It will help if your mouth is not dry. Saliva around the tablet will help it to dissolve more quickly. Do not eat or drink, smoke or chew tobacco while a tablet is dissolving. If you are not better within 5 minutes after taking ONE dose of nitroglycerin, call 9-1-1 immediately to seek emergency medical care. Do not take more than 3 nitroglycerin tablets over 15 minutes. If you take this medicine often to relieve symptoms of angina, your doctor or health care professional may provide you with different instructions to manage your symptoms. If symptoms do not go away after following these instructions, it is important to call 9-1-1 immediately. Do not take more than 3 nitroglycerin tablets over 15 minutes. Talk to your pediatrician regarding the use of this medicine in children. Special care may be needed. Overdosage: If you think you have taken too much of this medicine contact a poison control center or emergency room at once. NOTE: This medicine is only for you. Do not share this medicine with others. What if I miss a dose? This does not apply. This medicine is only used as needed. What may interact with this medicine? Do not take this medicine with any of the following medications: -certain migraine medicines like ergotamine and dihydroergotamine (DHE) -medicines used to treat erectile dysfunction like sildenafil, tadalafil, and vardenafil -riociguat This medicine may also interact with the following medications: -alteplase -aspirin -heparin -medicines for high blood pressure -medicines for mental depression -other medicines used to treat angina -phenothiazines like chlorpromazine, mesoridazine, prochlorperazine, thioridazine This list may not describe all possible  interactions. Give your health care provider a list of all the medicines, herbs, non-prescription drugs, or dietary  supplements you use. Also tell them if you smoke, drink alcohol, or use illegal drugs. Some items may interact with your medicine. What should I watch for while using this medicine? Tell your doctor or health care professional if you feel your medicine is no longer working. Keep this medicine with you at all times. Sit or lie down when you take your medicine to prevent falling if you feel dizzy or faint after using it. Try to remain calm. This will help you to feel better faster. If you feel dizzy, take several deep breaths and lie down with your feet propped up, or bend forward with your head resting between your knees. You may get drowsy or dizzy. Do not drive, use machinery, or do anything that needs mental alertness until you know how this drug affects you. Do not stand or sit up quickly, especially if you are an older patient. This reduces the risk of dizzy or fainting spells. Alcohol can make you more drowsy and dizzy. Avoid alcoholic drinks. Do not treat yourself for coughs, colds, or pain while you are taking this medicine without asking your doctor or health care professional for advice. Some ingredients may increase your blood pressure. What side effects may I notice from receiving this medicine? Side effects that you should report to your doctor or health care professional as soon as possible: -blurred vision -dry mouth -skin rash -sweating -the feeling of extreme pressure in the head -unusually weak or tired Side effects that usually do not require medical attention (report to your doctor or health care professional if they continue or are bothersome): -flushing of the face or neck -headache -irregular heartbeat, palpitations -nausea, vomiting This list may not describe all possible side effects. Call your doctor for medical advice about side effects. You may report side effects to FDA at 1-800-FDA-1088. Where should I keep my medicine? Keep out of the reach of children. Store at  room temperature between 20 and 25 degrees C (68 and 77 degrees F). Store in Retail buyeroriginal container. Protect from light and moisture. Keep tightly closed. Throw away any unused medicine after the expiration date. NOTE: This sheet is a summary. It may not cover all possible information. If you have questions about this medicine, talk to your doctor, pharmacist, or health care provider.  2018 Elsevier/Gold Standard (2013-01-02 17:57:36)

## 2017-02-13 NOTE — Progress Notes (Signed)
Cardiology Office Note:    Date:  02/13/2017   ID:  Anita Booker, DOB 05/28/1960, MRN [id number]  PCP:  Marinda ElkFried, Robert, MD  Cardiologist:  Garwin Brothersajan R Revankar, MD   Referring MD: Terri PiedraForcucci, Courtney, PA-C    ASSESSMENT:    1. Chest pain, unspecified type   2. Essential hypertension   3. Mixed dyslipidemia   4. Morbid obesity (HCC)    PLAN:    In order of problems listed above:  1. Primary prevention stressed with the patient.  Importance of compliance with diet and medications stressed. 2. Risks of obesity explained in diet was explained.  She was counseled to lose weight and she vocalized understanding. 3. No chest pain is atypical for coronary etiology, however she has multiple risk factors for coronary artery disease she will undergo Lexiscan sestamibi testing.  Further recommendations will be made based on the findings of the follow up of these results. 4. Patient will be seen in follow-up appointment in 2 months or earlier if the patient has any concerns 5. Sublingual nitroglycerin prescription was sent, its protocol and 911 protocol explained and the patient vocalized understanding questions were answered to the patient's satisfaction   Medication Adjustments/Labs and Tests Ordered: Current medicines are reviewed at length with the patient today.  Concerns regarding medicines are outlined above.  No orders of the defined types were placed in this encounter.  No orders of the defined types were placed in this encounter.    History of Present Illness:    Anita Booker is a 56 y.o. female who is being seen today for the evaluation of chest pain at the request of Terri PiedraForcucci, Courtney, [place].  Patient is a pleasant 56 year old female.  She has past medical history of essential hypertension, dyslipidemia and morbid obesity.  She has been trying to lose weight.  She comes in in a motorized scooter here.  She mentions to me that she has bone-on-bone on her knees and is  contemplating knee surgery but that has not been done because of the fact that she is morbidly obese and her doctors are awaiting her to lose weight.  She mentions to me that she sometimes has dull chest pain no radiation to the neck or to the arms.  It is only dull in nature.  It does not occur with exertion it comes at random times.  At the time of my evaluation she is alert awake oriented and in no distress.  She denies any history of syncope or seizures.  Past Medical History:  Diagnosis Date  . Arthritis   . Complication of anesthesia 1995   woke up on table right after gallbladder surgery  . Family history of adverse reaction to anesthesia    sister coded twice with anesthesai not sure of details sister still living  . Family history of breast cancer   . Family history of uterine cancer   . Headache    migraines  . Other and unspecified hyperlipidemia   . Unspecified essential hypertension     Past Surgical History:  Procedure Laterality Date  . BREAST SURGERY  07/01/2011.   I & D of right breast abscess  . CHOLECYSTECTOMY  1995  . COLONOSCOPY WITH PROPOFOL N/A 07/05/2016   Procedure: COLONOSCOPY WITH PROPOFOL;  Surgeon: Willis ModenaWilliam Outlaw, MD;  Location: [facility];  Service: Endoscopy;  Laterality: N/A;    Current Medications: No outpatient medications have been marked as taking for the 02/13/17 encounter (Office Visit) with Revankar, Aundra Dubinajan R, MD.  Allergies:   Butrans [buprenorphine] and Codeine   Social History   Socioeconomic History  . Marital status: Married    Spouse name: None  . Number of children: None  . Years of education: None  . Highest education level: None  Social Needs  . Financial resource strain: None  . Food insecurity - worry: None  . Food insecurity - inability: None  . Transportation needs - medical: None  . Transportation needs - non-medical: None  Occupational History  . None  Tobacco Use  . Smoking status: Never Smoker  . Smokeless  tobacco: Never Used  Substance and Sexual Activity  . Alcohol use: No  . Drug use: No  . Sexual activity: None  Other Topics Concern  . None  Social History Narrative  . None     Family History: The patient's family history includes Breast cancer in her cousin and other; Breast cancer (age of onset: 6555) in her sister; Cancer in her father and paternal grandmother; Healthy in her brother and sister; Heart attack (age of onset: 3948) in her brother; Liver cancer in her cousin; Lung cancer in her paternal uncle; Lung cancer (age of onset: 6467) in her father; Uterine cancer (age of onset: 8387) in her mother.  ROS:   Please see the history of present illness.    All other systems reviewed and are negative.  EKGs/Labs/Other Studies Reviewed:    The following studies were reviewed today: I reviewed records from previous office visits.  EKG done today revealed sinus rhythm with nonspecific ST-T changes.   Recent Labs: No results found for requested labs within last 8760 hours.  Recent Lipid Panel    Component Value Date/Time   CHOL 149 04/11/2011 0849   TRIG 83.0 04/11/2011 0849   HDL 45.10 04/11/2011 0849   CHOLHDL 3 04/11/2011 0849   VLDL 16.6 04/11/2011 0849   LDLCALC 87 04/11/2011 0849    Physical Exam:    VS:  BP 112/80   Pulse 80   Ht 5\' 2"  (1.575 m)   Wt 297 lb (134.7 kg)   SpO2 96%   BMI 54.32 kg/m     Wt Readings from Last 3 Encounters:  02/13/17 297 lb (134.7 kg)  12/11/16 282 lb (127.9 kg)  07/05/16 (!) 312 lb (141.5 kg)     GEN: Patient is in no acute distress HEENT: Normal NECK: No JVD; No carotid bruits LYMPHATICS: No lymphadenopathy CARDIAC: S1 S2 regular, 2/6 systolic murmur at the apex. RESPIRATORY:  Clear to auscultation without rales, wheezing or rhonchi  ABDOMEN: Soft, non-tender, non-distended MUSCULOSKELETAL:  No edema; No deformity  SKIN: Warm and dry NEUROLOGIC:  Alert and oriented x 3 PSYCHIATRIC:  Normal affect    Signed, Garwin Brothersajan R  Revankar, MD  02/13/2017 9:45 AM    [facility]

## 2017-02-19 ENCOUNTER — Telehealth (HOSPITAL_COMMUNITY): Payer: Self-pay | Admitting: *Deleted

## 2017-02-19 NOTE — Telephone Encounter (Signed)
Left message on voicemail in reference to upcoming appointment scheduled for 02/21/17. Phone number given for a call back so details instructions can be given. Carlina Derks, Adelene IdlerCynthia W

## 2017-02-20 ENCOUNTER — Telehealth: Payer: Self-pay | Admitting: *Deleted

## 2017-02-20 NOTE — Telephone Encounter (Signed)
duplicate

## 2017-02-20 NOTE — Telephone Encounter (Addendum)
John from [facility] called to report that patient received a script from primary care for Contrave.  He reports that this medication contains naltrexone which apparently can have adverse effects on belbuca.  Patient is wanting her next refill on belbuca and the pharmacy is awaiting Dr. Wynn BankerKirsteins input.

## 2017-02-20 NOTE — Telephone Encounter (Signed)
Contacted pharmacy and gave OK

## 2017-02-20 NOTE — Telephone Encounter (Signed)
There is no dangerous interaction.  Contrave may reduce effect or buprenorphine but this may not be noticeable

## 2017-02-21 ENCOUNTER — Ambulatory Visit (HOSPITAL_COMMUNITY): Payer: 59

## 2017-02-21 ENCOUNTER — Telehealth (HOSPITAL_COMMUNITY): Payer: Self-pay | Admitting: *Deleted

## 2017-02-21 NOTE — Telephone Encounter (Signed)
Left message on voicemail in reference to upcoming appointment scheduled for 02/28/17. Phone number given for a call back so details instructions can be given. Anita Booker, [name] [name]

## 2017-02-22 ENCOUNTER — Ambulatory Visit (HOSPITAL_COMMUNITY): Payer: 59

## 2017-02-28 ENCOUNTER — Ambulatory Visit (HOSPITAL_COMMUNITY): Payer: 59

## 2017-03-01 ENCOUNTER — Ambulatory Visit (HOSPITAL_COMMUNITY): Payer: 59

## 2017-03-07 ENCOUNTER — Telehealth (HOSPITAL_COMMUNITY): Payer: Self-pay | Admitting: *Deleted

## 2017-03-07 NOTE — Telephone Encounter (Signed)
Left message on voicemail in reference to upcoming appointment scheduled for 03/14/17. Phone number given for a call back so details instructions can be given. Anita Booker [name]   

## 2017-03-14 ENCOUNTER — Ambulatory Visit (HOSPITAL_COMMUNITY): Payer: 59 | Attending: Cardiology

## 2017-03-14 ENCOUNTER — Ambulatory Visit (HOSPITAL_COMMUNITY): Payer: 59

## 2017-03-14 DIAGNOSIS — R9431 Abnormal electrocardiogram [ECG] [EKG]: Secondary | ICD-10-CM | POA: Diagnosis not present

## 2017-03-14 DIAGNOSIS — R9439 Abnormal result of other cardiovascular function study: Secondary | ICD-10-CM | POA: Insufficient documentation

## 2017-03-14 DIAGNOSIS — I10 Essential (primary) hypertension: Secondary | ICD-10-CM | POA: Diagnosis not present

## 2017-03-14 DIAGNOSIS — R079 Chest pain, unspecified: Secondary | ICD-10-CM | POA: Diagnosis not present

## 2017-03-14 MED ORDER — TECHNETIUM TC 99M TETROFOSMIN IV KIT
32.8000 | PACK | Freq: Once | INTRAVENOUS | Status: AC | PRN
  Administered 2017-03-14: 32.8 via INTRAVENOUS
  Filled 2017-03-14: qty 33

## 2017-03-14 MED ORDER — REGADENOSON 0.4 MG/5ML IV SOLN
0.4000 mg | Freq: Once | INTRAVENOUS | Status: AC
  Administered 2017-03-14: 0.4 mg via INTRAVENOUS

## 2017-03-15 ENCOUNTER — Ambulatory Visit (HOSPITAL_COMMUNITY): Payer: 59 | Attending: Cardiovascular Disease

## 2017-03-15 LAB — MYOCARDIAL PERFUSION IMAGING
CHL CUP NUCLEAR SSS: 15
CHL CUP RESTING HR STRESS: 96 {beats}/min
LHR: 0.49
LV dias vol: 105 mL (ref 46–106)
LVSYSVOL: 39 mL
NUC STRESS TID: 0.79
Peak HR: 103 {beats}/min
SDS: 10
SRS: 5

## 2017-03-15 MED ORDER — TECHNETIUM TC 99M TETROFOSMIN IV KIT
32.5000 | PACK | Freq: Once | INTRAVENOUS | Status: AC | PRN
  Administered 2017-03-15: 32.5 via INTRAVENOUS
  Filled 2017-03-15: qty 33

## 2017-03-23 ENCOUNTER — Ambulatory Visit (HOSPITAL_BASED_OUTPATIENT_CLINIC_OR_DEPARTMENT_OTHER)
Admission: RE | Admit: 2017-03-23 | Discharge: 2017-03-23 | Disposition: A | Discharge status: Home / Self Care | Payer: 59 | Source: Ambulatory Visit | Attending: Cardiology | Admitting: Cardiology

## 2017-03-23 ENCOUNTER — Encounter: Payer: Self-pay | Admitting: Cardiology

## 2017-03-23 ENCOUNTER — Ambulatory Visit (INDEPENDENT_AMBULATORY_CARE_PROVIDER_SITE_OTHER): Payer: 59 | Admitting: Cardiology

## 2017-03-23 VITALS — BP 124/68 | HR 72 | Ht 62.0 in | Wt 297.0 lb

## 2017-03-23 DIAGNOSIS — R931 Abnormal findings on diagnostic imaging of heart and coronary circulation: Secondary | ICD-10-CM | POA: Diagnosis not present

## 2017-03-23 DIAGNOSIS — I10 Essential (primary) hypertension: Secondary | ICD-10-CM

## 2017-03-23 DIAGNOSIS — E782 Mixed hyperlipidemia: Secondary | ICD-10-CM | POA: Diagnosis not present

## 2017-03-23 HISTORY — DX: Abnormal findings on diagnostic imaging of heart and coronary circulation: R93.1

## 2017-03-23 NOTE — H&P (View-Only) (Signed)
Cardiology Office Note:    Date:  03/23/2017   ID:  Anita Booker, DOB Sep 25, 1960, MRN [id number]  PCP:  Anita Elk, MD  Cardiologist:  Anita Brothers, MD   Referring MD: Anita Elk, MD    ASSESSMENT:    1. Abnormal nuclear cardiac imaging test   2. Essential hypertension   3. Mixed dyslipidemia   4. Morbid obesity (HCC)    PLAN:    In order of problems listed above:  1. The results of the test were discussed with the patient at length.I discussed coronary angiography and left heart catheterization with the patient at extensive length. Procedure, benefits and potential risks were explained. Patient had multiple questions which were answered to the patient's satisfaction. Patient agreed and consented for the procedure. Further recommendations will be made based on the findings of the coronary angiography. In the interim. The patient has any significant symptoms he knows to go to the nearest emergency room. 2. Her BP is stable and diet was discussed for Obesity and risks detail. 3. She will be seen in f/u after her angiography.    Medication Adjustments/Labs and Tests Ordered: Current medicines are reviewed at length with the patient today.  Concerns regarding medicines are outlined above.  Orders Placed This Encounter  Procedures  . DG Chest 2 View  . CBC with Differential/Platelet  . Basic metabolic panel  . INR/PT   No orders of the defined types were placed in this encounter.    Chief Complaint  Patient presents with  . Follow-up    f/u abnormal nuclear testing     History of Present Illness:    Anita Booker is a 57 y.o. female .  The patient was evaluated by me for chest tightness.  She underwent stress testing this revealed evidence of ischemia..  The patient is here to discuss these results and is accompanied by her husband.  She mentions to me that when she is stressed she has substernal chest tightness.  No radiation to the neck or to  the arm.  The symptoms concern her.  She has not used sublingual nitroglycerin.  She is a Engineer, civil (consulting) by profession.  Past Medical History:  Diagnosis Date  . Arthritis   . Complication of anesthesia 1995   woke up on table right after gallbladder surgery  . Family history of adverse reaction to anesthesia    sister coded twice with anesthesai not sure of details sister still living  . Family history of breast cancer   . Family history of uterine cancer   . Headache    migraines  . Other and unspecified hyperlipidemia   . Unspecified essential hypertension     Past Surgical History:  Procedure Laterality Date  . BREAST SURGERY  07/01/2011.   I & D of right breast abscess  . CHOLECYSTECTOMY  1995  . COLONOSCOPY WITH PROPOFOL N/A 07/05/2016   Procedure: COLONOSCOPY WITH PROPOFOL;  Surgeon: Willis Modena, MD;  Location: [facility];  Service: Endoscopy;  Laterality: N/A;    Current Medications: Current Meds  Medication Sig  . aspirin EC 81 MG tablet Take 81 mg by mouth daily.   Marland Kitchen atorvastatin (LIPITOR) 40 MG tablet TAKE 1 TABLET DAILY  . Buprenorphine HCl (BELBUCA) 75 MCG FILM Place 75 mcg inside cheek every 12 (twelve) hours.  . celecoxib (CELEBREX) 200 MG capsule Take 1 capsule (200 mg total) daily by mouth.  . furosemide (LASIX) 40 MG tablet Take 40 mg by mouth daily. Reported on  06/04/2015  . lidocaine (LIDODERM) 5 % APPLY 2 PATCHES TO SKIN, REMOVE AFTER 12 HOURS ONCE DAILY  . nitroGLYCERIN (NITROSTAT) 0.4 MG SL tablet Place 1 tablet (0.4 mg total) under the tongue every 5 (five) minutes as needed for chest pain.  . nortriptyline (PAMELOR) 50 MG capsule Take 100 mg by mouth at bedtime. Reported on 06/04/2015  . traMADol (ULTRAM) 50 MG tablet Take 1 tablet (50 mg total) by mouth 2 (two) times daily.  . valsartan (DIOVAN) 160 MG tablet Take 160 mg by mouth daily. Reported on 06/04/2015  . zolpidem (AMBIEN) 10 MG tablet Take 10 mg by mouth at bedtime as needed. Reported on 06/04/2015      Allergies:   Butrans [buprenorphine] and Codeine   Social History   Socioeconomic History  . Marital status: Married    Spouse name: None  . Number of children: None  . Years of education: None  . Highest education level: None  Social Needs  . Financial resource strain: None  . Food insecurity - worry: None  . Food insecurity - inability: None  . Transportation needs - medical: None  . Transportation needs - non-medical: None  Occupational History  . None  Tobacco Use  . Smoking status: Never Smoker  . Smokeless tobacco: Never Used  Substance and Sexual Activity  . Alcohol use: No  . Drug use: No  . Sexual activity: None  Other Topics Concern  . None  Social History Narrative  . None     Family History: The patient's family history includes Breast cancer in her cousin and other; Breast cancer (age of onset: 89) in her sister; Cancer in her father and paternal grandmother; Healthy in her brother and sister; Heart attack (age of onset: 78) in her brother; Liver cancer in her cousin; Lung cancer in her paternal uncle; Lung cancer (age of onset: 95) in her father; Uterine cancer (age of onset: 59) in her mother.  ROS:   Please see the history of present illness.    All other systems reviewed and are negative.  EKGs/Labs/Other Studies Reviewed:    The following studies were reviewed today: I discussed the findings of the stress test with the patient at extensive length questions were answered to her satisfaction.    Recent Labs: No results found for requested labs within last 8760 hours.  Recent Lipid Panel    Component Value Date/Time   CHOL 149 04/11/2011 0849   TRIG 83.0 04/11/2011 0849   HDL 45.10 04/11/2011 0849   CHOLHDL 3 04/11/2011 0849   VLDL 16.6 04/11/2011 0849   LDLCALC 87 04/11/2011 0849    Physical Exam:    VS:  BP 124/68 (BP Location: Right Arm, Patient Position: Sitting)   Pulse 72   Ht 5\' 2"  (1.575 m)   Wt 297 lb (134.7 kg)   SpO2 97%    BMI 54.32 kg/m     Wt Readings from Last 3 Encounters:  03/23/17 297 lb (134.7 kg)  03/14/17 297 lb (134.7 kg)  02/13/17 297 lb (134.7 kg)     GEN: Patient is in no acute distress HEENT: Normal NECK: No JVD; No carotid bruits LYMPHATICS: No lymphadenopathy CARDIAC: Hear sounds regular, 2/6 systolic murmur at the apex. RESPIRATORY:  Clear to auscultation without rales, wheezing or rhonchi  ABDOMEN: Soft, non-tender, non-distended MUSCULOSKELETAL:  No edema; No deformity  SKIN: Warm and dry NEUROLOGIC:  Alert and oriented x 3 PSYCHIATRIC:  Normal affect   Signed, Anita Brothers, MD  03/23/2017 9:52 AM    [facility]

## 2017-03-23 NOTE — Progress Notes (Signed)
Cardiology Office Note:    Date:  03/23/2017   ID:  Anita Booker, DOB Sep 25, 1960, MRN [id number]  PCP:  Anita Elk, MD  Cardiologist:  Garwin Brothers, MD   Referring MD: Anita Elk, MD    ASSESSMENT:    1. Abnormal nuclear cardiac imaging test   2. Essential hypertension   3. Mixed dyslipidemia   4. Morbid obesity (HCC)    PLAN:    In order of problems listed above:  1. The results of the test were discussed with the patient at length.I discussed coronary angiography and left heart catheterization with the patient at extensive length. Procedure, benefits and potential risks were explained. Patient had multiple questions which were answered to the patient's satisfaction. Patient agreed and consented for the procedure. Further recommendations will be made based on the findings of the coronary angiography. In the interim. The patient has any significant symptoms he knows to go to the nearest emergency room. 2. Her BP is stable and diet was discussed for Obesity and risks detail. 3. She will be seen in f/u after her angiography.    Medication Adjustments/Labs and Tests Ordered: Current medicines are reviewed at length with the patient today.  Concerns regarding medicines are outlined above.  Orders Placed This Encounter  Procedures  . DG Chest 2 View  . CBC with Differential/Platelet  . Basic metabolic panel  . INR/PT   No orders of the defined types were placed in this encounter.    Chief Complaint  Patient presents with  . Follow-up    f/u abnormal nuclear testing     History of Present Illness:    Anita Booker is a 57 y.o. female .  The patient was evaluated by me for chest tightness.  She underwent stress testing this revealed evidence of ischemia..  The patient is here to discuss these results and is accompanied by her husband.  She mentions to me that when she is stressed she has substernal chest tightness.  No radiation to the neck or to  the arm.  The symptoms concern her.  She has not used sublingual nitroglycerin.  She is a Engineer, civil (consulting) by profession.  Past Medical History:  Diagnosis Date  . Arthritis   . Complication of anesthesia 1995   woke up on table right after gallbladder surgery  . Family history of adverse reaction to anesthesia    sister coded twice with anesthesai not sure of details sister still living  . Family history of breast cancer   . Family history of uterine cancer   . Headache    migraines  . Other and unspecified hyperlipidemia   . Unspecified essential hypertension     Past Surgical History:  Procedure Laterality Date  . BREAST SURGERY  07/01/2011.   I & D of right breast abscess  . CHOLECYSTECTOMY  1995  . COLONOSCOPY WITH PROPOFOL N/A 07/05/2016   Procedure: COLONOSCOPY WITH PROPOFOL;  Surgeon: Willis Modena, MD;  Location: [facility];  Service: Endoscopy;  Laterality: N/A;    Current Medications: Current Meds  Medication Sig  . aspirin EC 81 MG tablet Take 81 mg by mouth daily.   Marland Kitchen atorvastatin (LIPITOR) 40 MG tablet TAKE 1 TABLET DAILY  . Buprenorphine HCl (BELBUCA) 75 MCG FILM Place 75 mcg inside cheek every 12 (twelve) hours.  . celecoxib (CELEBREX) 200 MG capsule Take 1 capsule (200 mg total) daily by mouth.  . furosemide (LASIX) 40 MG tablet Take 40 mg by mouth daily. Reported on  06/04/2015  . lidocaine (LIDODERM) 5 % APPLY 2 PATCHES TO SKIN, REMOVE AFTER 12 HOURS ONCE DAILY  . nitroGLYCERIN (NITROSTAT) 0.4 MG SL tablet Place 1 tablet (0.4 mg total) under the tongue every 5 (five) minutes as needed for chest pain.  . nortriptyline (PAMELOR) 50 MG capsule Take 100 mg by mouth at bedtime. Reported on 06/04/2015  . traMADol (ULTRAM) 50 MG tablet Take 1 tablet (50 mg total) by mouth 2 (two) times daily.  . valsartan (DIOVAN) 160 MG tablet Take 160 mg by mouth daily. Reported on 06/04/2015  . zolpidem (AMBIEN) 10 MG tablet Take 10 mg by mouth at bedtime as needed. Reported on 06/04/2015      Allergies:   Butrans [buprenorphine] and Codeine   Social History   Socioeconomic History  . Marital status: Married    Spouse name: None  . Number of children: None  . Years of education: None  . Highest education level: None  Social Needs  . Financial resource strain: None  . Food insecurity - worry: None  . Food insecurity - inability: None  . Transportation needs - medical: None  . Transportation needs - non-medical: None  Occupational History  . None  Tobacco Use  . Smoking status: Never Smoker  . Smokeless tobacco: Never Used  Substance and Sexual Activity  . Alcohol use: No  . Drug use: No  . Sexual activity: None  Other Topics Concern  . None  Social History Narrative  . None     Family History: The patient's family history includes Breast cancer in her cousin and other; Breast cancer (age of onset: 89) in her sister; Cancer in her father and paternal grandmother; Healthy in her brother and sister; Heart attack (age of onset: 78) in her brother; Liver cancer in her cousin; Lung cancer in her paternal uncle; Lung cancer (age of onset: 95) in her father; Uterine cancer (age of onset: 59) in her mother.  ROS:   Please see the history of present illness.    All other systems reviewed and are negative.  EKGs/Labs/Other Studies Reviewed:    The following studies were reviewed today: I discussed the findings of the stress test with the patient at extensive length questions were answered to her satisfaction.    Recent Labs: No results found for requested labs within last 8760 hours.  Recent Lipid Panel    Component Value Date/Time   CHOL 149 04/11/2011 0849   TRIG 83.0 04/11/2011 0849   HDL 45.10 04/11/2011 0849   CHOLHDL 3 04/11/2011 0849   VLDL 16.6 04/11/2011 0849   LDLCALC 87 04/11/2011 0849    Physical Exam:    VS:  BP 124/68 (BP Location: Right Arm, Patient Position: Sitting)   Pulse 72   Ht 5\' 2"  (1.575 m)   Wt 297 lb (134.7 kg)   SpO2 97%    BMI 54.32 kg/m     Wt Readings from Last 3 Encounters:  03/23/17 297 lb (134.7 kg)  03/14/17 297 lb (134.7 kg)  02/13/17 297 lb (134.7 kg)     GEN: Patient is in no acute distress HEENT: Normal NECK: No JVD; No carotid bruits LYMPHATICS: No lymphadenopathy CARDIAC: Hear sounds regular, 2/6 systolic murmur at the apex. RESPIRATORY:  Clear to auscultation without rales, wheezing or rhonchi  ABDOMEN: Soft, non-tender, non-distended MUSCULOSKELETAL:  No edema; No deformity  SKIN: Warm and dry NEUROLOGIC:  Alert and oriented x 3 PSYCHIATRIC:  Normal affect   Signed, Garwin Brothers, MD  03/23/2017 9:52 AM    [facility]

## 2017-03-23 NOTE — Patient Instructions (Signed)
Medication Instructions:  Your physician recommends that you continue on your current medications as directed. Please refer to the Current Medication list given to you today.  Labwork: Your physician recommends that you have the following labs drawn: CBC, BMP and PT/INR  Testing/Procedures: A chest x-ray takes a picture of the organs and structures inside the chest, including the heart, lungs, and blood vessels. This test can show several things, including, whether the heart is enlarges; whether fluid is building up in the lungs; and whether pacemaker / defibrillator leads are still in place.    [facility] [address], [address] [place] [address] Dept: [phone] Loc: [phone]  Anita Booker  03/23/2017  You are scheduled for a Cardiac Catheterization on Monday, January 7 with Dr. Cristal Deerhristopher End.  1. [facility] [address] [place], [place] [address] at 8:00 AM (two hours before your procedure to ensure your preparation). Free valet parking service is available.   Special note: Every effort is made to have your procedure done on time. Please understand that emergencies sometimes delay scheduled procedures.  2. Diet: Do not eat or drink anything after midnight prior to your procedure except sips of water to take medications.  3. Labs: Done today  4. Medication instructions in preparation for your procedure:   Do not take your lasix the morning of the heart cath     On the morning of your procedure, take your Aspirin and any morning medicines NOT listed above.  You may use sips of water.  5. Plan for one night stay--bring personal belongings. 6. Bring a current list of your medications and current insurance cards. 7. You MUST have a responsible person to drive you home. 8. Someone MUST be with you the first 24  hours after you arrive home or your discharge will be delayed. 9. Please wear clothes that are easy to get on and off and wear slip-on shoes.  Thank you for allowing us to care for you!   -- [facility]  Follow-Up: Your physician recommends that you schedule a follow-up appointment in: 1 month  Any Other Special Instructions Will Be Listed Below (If Applicable).     If you need a refill on your cardiac medications before your next appointment, please call your pharmacy.   [facility]  Garey HamAshley A, RN, BSN   Coronary Angiogram With Stent Coronary angiogram with stent placement is a procedure to widen or open a narrow blood vessel of the heart (coronary artery). Arteries may become blocked by cholesterol buildup (plaques) in the lining of the wall. When a coronary artery becomes partially blocked, blood flow to that area decreases. This may lead to chest pain or a heart attack (myocardial infarction). A stent is a small piece of metal that looks like mesh or a spring. Stent placement may be done as treatment for a heart attack or right after a coronary angiogram in which a blocked artery is found. Let your health care provider know about:  Any allergies you have.  All medicines you are taking, including vitamins, herbs, eye drops, creams, and over-the-counter medicines.  Any problems you or family members have had with anesthetic medicines.  Any blood disorders you have.  Any surgeries you have had.  Any medical conditions you have.  Whether you are pregnant or may be pregnant. What are  the risks? Generally, this is a safe procedure. However, problems may occur, including:  Damage to the heart or its blood vessels.  A return of blockage.  Bleeding, infection, or bruising at the insertion site.  A collection of blood under the skin (hematoma) at the insertion site.  A blood clot in another part of the body.  Kidney  injury.  Allergic reaction to the dye or contrast that is used.  Bleeding into the abdomen (retroperitoneal bleeding).  What happens before the procedure? Staying hydrated Follow instructions from your health care provider about hydration, which may include:  Up to 2 hours before the procedure - you may continue to drink clear liquids, such as water, clear fruit juice, black coffee, and plain tea.  Eating and drinking restrictions Follow instructions from your health care provider about eating and drinking, which may include:  8 hours before the procedure - stop eating heavy meals or foods such as meat, fried foods, or fatty foods.  6 hours before the procedure - stop eating light meals or foods, such as toast or cereal.  2 hours before the procedure - stop drinking clear liquids.  Ask your health care provider about:  Changing or stopping your regular medicines. This is especially important if you are taking diabetes medicines or blood thinners.  Taking medicines such as ibuprofen. These medicines can thin your blood. Do not take these medicines before your procedure if your health care provider instructs you not to. Generally, aspirin is recommended before a procedure of passing a small, thin tube (catheter) through a blood vessel and into the heart (cardiac catheterization).  What happens during the procedure?  An IV tube will be inserted into one of your veins.  You will be given one or more of the following: ? A medicine to help you relax (sedative). ? A medicine to numb the area where the catheter will be inserted into an artery (local anesthetic).  To reduce your risk of infection: ? Your health care team will wash or sanitize their hands. ? Your skin will be washed with soap. ? Hair may be removed from the area where the catheter will be inserted.  Using a guide wire, the catheter will be inserted into an artery. The location may be in your groin, in your wrist, or in  the fold of your arm (near your elbow).  A type of X-ray (fluoroscopy) will be used to help guide the catheter to the opening of the arteries in the heart.  A dye will be injected into the catheter, and X-rays will be taken. The dye will help to show where any narrowing or blockages are located in the arteries.  A tiny wire will be guided to the blocked spot, and a balloon will be inflated to make the artery wider.  The stent will be expanded and will crush the plaques into the wall of the vessel. The stent will hold the area open and improve the blood flow. Most stents have a drug coating to reduce the risk of the stent narrowing over time.  The artery may be made wider using a drill, laser, or other tools to remove plaques.  When the blood flow is better, the catheter will be removed. The lining of the artery will grow over the stent, which stays where it was placed. This procedure may vary among health care providers and hospitals. What happens after the procedure?  If the procedure is done through the leg, you will be kept in  bed lying flat for about 6 hours. You will be instructed to not bend and not cross your legs.  The insertion site will be checked frequently.  The pulse in your foot or wrist will be checked frequently.  You may have additional blood tests, X-rays, and a test that records the electrical activity of your heart (electrocardiogram, or ECG). This information is not intended to replace advice given to you by your health care provider. Make sure you discuss any questions you have with your health care provider. Document Released: 09/10/2002 Document Revised: 11/04/2015 Document Reviewed: 10/10/2015 Elsevier Interactive Patient Education  [employer].

## 2017-03-24 LAB — CBC WITH DIFFERENTIAL/PLATELET
BASOS ABS: 41 {cells}/uL (ref 0–200)
Basophils Relative: 0.6 %
Eosinophils Absolute: 248 cells/uL (ref 15–500)
Eosinophils Relative: 3.6 %
HEMATOCRIT: 40.6 % (ref 35.0–45.0)
HEMOGLOBIN: 13.8 g/dL (ref 11.7–15.5)
LYMPHS ABS: 2374 {cells}/uL (ref 850–3900)
MCH: 28.9 pg (ref 27.0–33.0)
MCHC: 34 g/dL (ref 32.0–36.0)
MCV: 84.9 fL (ref 80.0–100.0)
MPV: 9.9 fL (ref 7.5–12.5)
Monocytes Relative: 7.1 %
NEUTROS ABS: 3747 {cells}/uL (ref 1500–7800)
NEUTROS PCT: 54.3 %
Platelets: 331 10*3/uL (ref 140–400)
RBC: 4.78 10*6/uL (ref 3.80–5.10)
RDW: 12.3 % (ref 11.0–15.0)
Total Lymphocyte: 34.4 %
WBC: 6.9 10*3/uL (ref 3.8–10.8)
WBCMIX: 490 {cells}/uL (ref 200–950)

## 2017-03-24 LAB — BASIC METABOLIC PANEL
BUN: 12 mg/dL (ref 7–25)
CALCIUM: 9.5 mg/dL (ref 8.6–10.4)
CHLORIDE: 102 mmol/L (ref 98–110)
CO2: 31 mmol/L (ref 20–32)
Creat: 0.63 mg/dL (ref 0.50–1.05)
Glucose, Bld: 85 mg/dL (ref 65–99)
POTASSIUM: 3.7 mmol/L (ref 3.5–5.3)
Sodium: 141 mmol/L (ref 135–146)

## 2017-03-24 LAB — PROTIME-INR
INR: 1
Prothrombin Time: 10.6 s (ref 9.0–11.5)

## 2017-03-26 ENCOUNTER — Encounter (HOSPITAL_COMMUNITY)
Admission: RE | Disposition: A | Discharge status: Home / Self Care | Payer: Self-pay | Source: Ambulatory Visit | Attending: Internal Medicine

## 2017-03-26 ENCOUNTER — Other Ambulatory Visit: Payer: Self-pay

## 2017-03-26 ENCOUNTER — Ambulatory Visit (HOSPITAL_COMMUNITY)
Admission: RE | Admit: 2017-03-26 | Discharge: 2017-03-26 | Disposition: A | Discharge status: Home / Self Care | Payer: 59 | Source: Ambulatory Visit | Attending: Internal Medicine | Admitting: Internal Medicine

## 2017-03-26 ENCOUNTER — Ambulatory Visit: Payer: 59

## 2017-03-26 DIAGNOSIS — Z801 Family history of malignant neoplasm of trachea, bronchus and lung: Secondary | ICD-10-CM | POA: Insufficient documentation

## 2017-03-26 DIAGNOSIS — Z7982 Long term (current) use of aspirin: Secondary | ICD-10-CM | POA: Diagnosis not present

## 2017-03-26 DIAGNOSIS — Z885 Allergy status to narcotic agent status: Secondary | ICD-10-CM | POA: Diagnosis not present

## 2017-03-26 DIAGNOSIS — Q245 Malformation of coronary vessels: Secondary | ICD-10-CM | POA: Insufficient documentation

## 2017-03-26 DIAGNOSIS — R079 Chest pain, unspecified: Secondary | ICD-10-CM | POA: Diagnosis not present

## 2017-03-26 DIAGNOSIS — Z6841 Body Mass Index (BMI) 40.0 and over, adult: Secondary | ICD-10-CM | POA: Insufficient documentation

## 2017-03-26 DIAGNOSIS — R931 Abnormal findings on diagnostic imaging of heart and coronary circulation: Secondary | ICD-10-CM | POA: Diagnosis not present

## 2017-03-26 DIAGNOSIS — Z8049 Family history of malignant neoplasm of other genital organs: Secondary | ICD-10-CM | POA: Insufficient documentation

## 2017-03-26 DIAGNOSIS — Z9889 Other specified postprocedural states: Secondary | ICD-10-CM | POA: Insufficient documentation

## 2017-03-26 DIAGNOSIS — R0789 Other chest pain: Secondary | ICD-10-CM | POA: Insufficient documentation

## 2017-03-26 DIAGNOSIS — M1990 Unspecified osteoarthritis, unspecified site: Secondary | ICD-10-CM | POA: Insufficient documentation

## 2017-03-26 DIAGNOSIS — Z8249 Family history of ischemic heart disease and other diseases of the circulatory system: Secondary | ICD-10-CM | POA: Insufficient documentation

## 2017-03-26 DIAGNOSIS — R9439 Abnormal result of other cardiovascular function study: Secondary | ICD-10-CM | POA: Diagnosis present

## 2017-03-26 DIAGNOSIS — Z803 Family history of malignant neoplasm of breast: Secondary | ICD-10-CM | POA: Diagnosis not present

## 2017-03-26 DIAGNOSIS — Z8489 Family history of other specified conditions: Secondary | ICD-10-CM | POA: Diagnosis not present

## 2017-03-26 DIAGNOSIS — Z888 Allergy status to other drugs, medicaments and biological substances status: Secondary | ICD-10-CM | POA: Diagnosis not present

## 2017-03-26 DIAGNOSIS — Z800 Family history of malignant neoplasm of digestive organs: Secondary | ICD-10-CM | POA: Diagnosis not present

## 2017-03-26 DIAGNOSIS — Z9049 Acquired absence of other specified parts of digestive tract: Secondary | ICD-10-CM | POA: Diagnosis not present

## 2017-03-26 DIAGNOSIS — E782 Mixed hyperlipidemia: Secondary | ICD-10-CM | POA: Insufficient documentation

## 2017-03-26 DIAGNOSIS — Z79899 Other long term (current) drug therapy: Secondary | ICD-10-CM | POA: Insufficient documentation

## 2017-03-26 DIAGNOSIS — I10 Essential (primary) hypertension: Secondary | ICD-10-CM | POA: Diagnosis not present

## 2017-03-26 HISTORY — PX: LEFT HEART CATH AND CORONARY ANGIOGRAPHY: CATH118249

## 2017-03-26 SURGERY — LEFT HEART CATH AND CORONARY ANGIOGRAPHY
Anesthesia: LOCAL

## 2017-03-26 MED ORDER — ASPIRIN 81 MG PO CHEW: 81.0000 mg | CHEWABLE_TABLET | ORAL | Status: DC

## 2017-03-26 MED ORDER — VERAPAMIL HCL 2.5 MG/ML IV SOLN
INTRAVENOUS | Status: AC
  Filled 2017-03-26: qty 2

## 2017-03-26 MED ORDER — SODIUM CHLORIDE 0.9% FLUSH: 3.0000 mL | Freq: Two times a day (BID) | INTRAVENOUS | Status: DC

## 2017-03-26 MED ORDER — SODIUM CHLORIDE 0.9 % IV SOLN: 250.0000 mL | INTRAVENOUS | Status: DC | PRN

## 2017-03-26 MED ORDER — HEPARIN SODIUM (PORCINE) 1000 UNIT/ML IJ SOLN
INTRAMUSCULAR | Status: DC | PRN
  Administered 2017-03-26: 5000 [IU] via INTRAVENOUS

## 2017-03-26 MED ORDER — VERAPAMIL HCL 2.5 MG/ML IV SOLN
INTRAVENOUS | Status: DC | PRN
  Administered 2017-03-26: 10 mL via INTRA_ARTERIAL

## 2017-03-26 MED ORDER — SODIUM CHLORIDE 0.9% FLUSH: 3.0000 mL | INTRAVENOUS | Status: DC | PRN

## 2017-03-26 MED ORDER — FENTANYL CITRATE (PF) 100 MCG/2ML IJ SOLN
INTRAMUSCULAR | Status: AC
  Filled 2017-03-26: qty 2

## 2017-03-26 MED ORDER — SODIUM CHLORIDE 0.9 % IV SOLN: INTRAVENOUS | Status: DC

## 2017-03-26 MED ORDER — IOPAMIDOL (ISOVUE-370) INJECTION 76%
INTRAVENOUS | Status: DC | PRN
  Administered 2017-03-26: 95 mL via INTRA_ARTERIAL

## 2017-03-26 MED ORDER — FENTANYL CITRATE (PF) 100 MCG/2ML IJ SOLN
INTRAMUSCULAR | Status: DC | PRN
  Administered 2017-03-26 (×2): 25 ug via INTRAVENOUS

## 2017-03-26 MED ORDER — MIDAZOLAM HCL 2 MG/2ML IJ SOLN
INTRAMUSCULAR | Status: DC | PRN
  Administered 2017-03-26 (×2): 1 mg via INTRAVENOUS

## 2017-03-26 MED ORDER — SODIUM CHLORIDE 0.9 % WEIGHT BASED INFUSION
3.0000 mL/kg/h | INTRAVENOUS | Status: AC
  Administered 2017-03-26: 3 mL/kg/h via INTRAVENOUS

## 2017-03-26 MED ORDER — HEPARIN (PORCINE) IN NACL 2-0.9 UNIT/ML-% IJ SOLN
INTRAMUSCULAR | Status: AC | PRN
  Administered 2017-03-26: 1000 mL

## 2017-03-26 MED ORDER — LIDOCAINE HCL (PF) 1 % IJ SOLN
INTRAMUSCULAR | Status: AC
  Filled 2017-03-26: qty 30

## 2017-03-26 MED ORDER — LIDOCAINE HCL (PF) 1 % IJ SOLN
INTRAMUSCULAR | Status: DC | PRN
  Administered 2017-03-26: 2 mL via INTRADERMAL

## 2017-03-26 MED ORDER — SODIUM CHLORIDE 0.9 % WEIGHT BASED INFUSION: 1.0000 mL/kg/h | INTRAVENOUS | Status: DC

## 2017-03-26 MED ORDER — MIDAZOLAM HCL 2 MG/2ML IJ SOLN
INTRAMUSCULAR | Status: AC
  Filled 2017-03-26: qty 2

## 2017-03-26 MED ORDER — HEPARIN (PORCINE) IN NACL 2-0.9 UNIT/ML-% IJ SOLN
INTRAMUSCULAR | Status: AC
  Filled 2017-03-26: qty 1000

## 2017-03-26 MED ORDER — HEPARIN SODIUM (PORCINE) 1000 UNIT/ML IJ SOLN
INTRAMUSCULAR | Status: AC
  Filled 2017-03-26: qty 1

## 2017-03-26 SURGICAL SUPPLY — 12 items
CATH 5FR JL3.5 JR4 ANG PIG MP (CATHETERS) ×2 IMPLANT
DEVICE RAD COMP TR BAND LRG (VASCULAR PRODUCTS) ×2 IMPLANT
GLIDESHEATH SLEND SS 6F .021 (SHEATH) ×2 IMPLANT
GUIDEWIRE INQWIRE 1.5J.035X260 (WIRE) ×1 IMPLANT
HOVERMATT SINGLE USE (MISCELLANEOUS) ×2 IMPLANT
INQWIRE 1.5J .035X260CM (WIRE) ×2
KIT HEART LEFT (KITS) ×2 IMPLANT
PACK CARDIAC CATHETERIZATION (CUSTOM PROCEDURE TRAY) ×2 IMPLANT
SYR MEDRAD MARK V 150ML (SYRINGE) ×2 IMPLANT
TRANSDUCER W/STOPCOCK (MISCELLANEOUS) ×2 IMPLANT
TUBING CIL FLEX 10 FLL-RA (TUBING) ×2 IMPLANT
WIRE HI TORQ VERSACORE-J 145CM (WIRE) ×2 IMPLANT

## 2017-03-26 NOTE — Interval H&P Note (Signed)
History and Physical Interval Note:  03/26/2017 10:12 AM  Anita Booker  has presented today for cardiac catheterization, with the diagnosis of atypical chest pain and abnormal nuclear stress test.  The various methods of treatment have been discussed with the patient and family. After consideration of risks, benefits and other options for treatment, the patient has consented to  Procedure(s): LEFT HEART CATH AND CORONARY ANGIOGRAPHY (N/A) as a surgical intervention .  The patient's history has been reviewed, patient examined, no change in status, stable for surgery.  I have reviewed the patient's chart and labs.  Questions were answered to the patient's satisfaction.    Cath Lab Visit (complete for each Cath Lab visit)  Clinical Evaluation Leading to the Procedure:   ACS: No.  Non-ACS:    Anginal Classification: CCS IV  Anti-ischemic medical therapy: No Therapy  Non-Invasive Test Results: Low-risk stress test findings: cardiac mortality <1%/year  Prior CABG: No previous CABG  [name]

## 2017-03-26 NOTE — Brief Op Note (Signed)
BRIEF CARDIAC CATHETERIZATION NOTE  DATE: 03/26/2017 TIME: 1:39 PM  PATIENT:  Anita Booker  57 y.o. female  PRE-OPERATIVE DIAGNOSIS:  Chest pain and abnormal stress test  POST-OPERATIVE DIAGNOSIS:  Same  PROCEDURE:  Procedure(s): LEFT HEART CATH AND CORONARY ANGIOGRAPHY (N/A)  SURGEON:  Surgeon(s) and Role:    Yvonne Kendall* Florentina Marquart, MD - Primary  FINDINGS: 1. No angiographically significant CAD. 2. Normal LV contraction. 3. Upper normal to mildly elevated LVEDP.  RECOMMENDATIONS: 1. Medical therapy and risk factor modification.  Yvonne Kendallhristopher Danali Marinos, MD [facility] Pager: [phone]

## 2017-03-26 NOTE — Discharge Instructions (Signed)
Radial Site Care °Refer to this sheet in the next few weeks. These instructions provide you with information about caring for yourself after your procedure. Your health care provider may also give you more specific instructions. Your treatment has been planned according to current medical practices, but problems sometimes occur. Call your health care provider if you have any problems or questions after your procedure. °What can I expect after the procedure? °After your procedure, it is typical to have the following: °· Bruising at the radial site that usually fades within 1-2 weeks. °· Blood collecting in the tissue (hematoma) that may be painful to the touch. It should usually decrease in size and tenderness within 1-2 weeks. ° °Follow these instructions at home: °· Take medicines only as directed by your health care provider. °· You may shower 24-48 hours after the procedure or as directed by your health care provider. Remove the bandage (dressing) and gently wash the site with plain soap and water. Pat the area dry with a clean towel. Do not rub the site, because this may cause bleeding. °· Do not take baths, swim, or use a hot tub until your health care provider approves. °· Check your insertion site every day for redness, swelling, or drainage. °· Do not apply powder or lotion to the site. °· Do not flex or bend the affected arm for 24 hours or as directed by your health care provider. °· Do not push or pull heavy objects with the affected arm for 24 hours or as directed by your health care provider. °· Do not lift over 10 lb (4.5 kg) for 5 days after your procedure or as directed by your health care provider. °· Ask your health care provider when it is okay to: °? Return to work or school. °? Resume usual physical activities or sports. °? Resume sexual activity. °· Do not drive home if you are discharged the same day as the procedure. Have someone else drive you. °· You may drive 24 hours after the procedure  unless otherwise instructed by your health care provider. °· Do not operate machinery or power tools for 24 hours after the procedure. °· If your procedure was done as an outpatient procedure, which means that you went home the same day as your procedure, a responsible adult should be with you for the first 24 hours after you arrive home. °· Keep all follow-up visits as directed by your health care provider. This is important. °Contact a health care provider if: °· You have a fever. °· You have chills. °· You have increased bleeding from the radial site. Hold pressure on the site. °Get help right away if: °· You have unusual pain at the radial site. °· You have redness, warmth, or swelling at the radial site. °· You have drainage (other than a small amount of blood on the dressing) from the radial site. °· The radial site is bleeding, and the bleeding does not stop after 30 minutes of holding steady pressure on the site. °· Your arm or hand becomes pale, cool, tingly, or numb. °This information is not intended to replace advice given to you by your health care provider. Make sure you discuss any questions you have with your health care provider. °Document Released: 04/08/2010 Document Revised: 08/12/2015 Document Reviewed: 09/22/2013 °Elsevier Interactive Patient Education © 2018 Elsevier Inc. ° ° ° °Moderate Conscious Sedation, Adult, Care After °These instructions provide you with information about caring for yourself after your procedure. Your health care provider   may also give you more specific instructions. Your treatment has been planned according to current medical practices, but problems sometimes occur. Call your health care provider if you have any problems or questions after your procedure. °What can I expect after the procedure? °After your procedure, it is common: °· To feel sleepy for several hours. °· To feel clumsy and have poor balance for several hours. °· To have poor judgment for several  hours. °· To vomit if you eat too soon. ° °Follow these instructions at home: °For at least 24 hours after the procedure: ° °· Do not: °? Participate in activities where you could fall or become injured. °? Drive. °? Use heavy machinery. °? Drink alcohol. °? Take sleeping pills or medicines that cause drowsiness. °? Make important decisions or sign legal documents. °? Take care of children on your own. °· Rest. °Eating and drinking °· Follow the diet recommended by your health care provider. °· If you vomit: °? Drink water, juice, or soup when you can drink without vomiting. °? Make sure you have little or no nausea before eating solid foods. °General instructions °· Have a responsible adult stay with you until you are awake and alert. °· Take over-the-counter and prescription medicines only as told by your health care provider. °· If you smoke, do not smoke without supervision. °· Keep all follow-up visits as told by your health care provider. This is important. °Contact a health care provider if: °· You keep feeling nauseous or you keep vomiting. °· You feel light-headed. °· You develop a rash. °· You have a fever. °Get help right away if: °· You have trouble breathing. °This information is not intended to replace advice given to you by your health care provider. Make sure you discuss any questions you have with your health care provider. °Document Released: 12/25/2012 Document Revised: 08/09/2015 Document Reviewed: 06/26/2015 °Elsevier Interactive Patient Education © 2018 Elsevier Inc. ° °

## 2017-03-27 ENCOUNTER — Encounter (HOSPITAL_COMMUNITY): Payer: Self-pay | Admitting: Internal Medicine

## 2017-03-30 ENCOUNTER — Ambulatory Visit (HOSPITAL_BASED_OUTPATIENT_CLINIC_OR_DEPARTMENT_OTHER): Payer: 59 | Admitting: Physical Medicine & Rehabilitation

## 2017-03-30 ENCOUNTER — Encounter: Payer: Self-pay | Admitting: Physical Medicine & Rehabilitation

## 2017-03-30 ENCOUNTER — Encounter: Payer: 59 | Attending: Physical Medicine & Rehabilitation

## 2017-03-30 VITALS — BP 155/91 | HR 91 | Resp 14

## 2017-03-30 DIAGNOSIS — M170 Bilateral primary osteoarthritis of knee: Secondary | ICD-10-CM

## 2017-03-30 MED ORDER — BUPRENORPHINE HCL 150 MCG BU FILM: 150.0000 ug | ORAL_FILM | Freq: Two times a day (BID) | BUCCAL | 2 refills | Status: DC

## 2017-03-30 NOTE — Progress Notes (Signed)
Knee injection Bilateral with ultrasound guidance)  Indication: Bilateral Knee pain not relieved by medication management and other conservative care.  Informed consent was obtained after describing risks and benefits of the procedure with the patient, this includes bleeding, bruising, infection and medication side effects. The patient wishes to proceed and has given written consent. The patient was placed in a recumbent position. The medial aspect of the Right  knee was marked and prepped with Betadine and alcohol. It was then entered with a 25-gauge 1-1/2 inch needle and 1 mL of 1% lidocaine was injected into the skin and subcutaneous tissue. Then another 22g 80mm ECHO blocneedle was inserted into the knee joint. After negative draw back for blood, a solution containing one ML of 6mg  per mL betamethasone and 3 mL of 1% lidocaine were injected. The same procedure was performed on the left knee using same technique equipment and medication. The patient tolerated the procedure well. Post procedure instructions were given.

## 2017-03-30 NOTE — Patient Instructions (Signed)
Genicular nerve blocks to knee is another potential treatment  Will stop tramadol   Start Belbuca twice a day

## 2017-04-20 ENCOUNTER — Ambulatory Visit (INDEPENDENT_AMBULATORY_CARE_PROVIDER_SITE_OTHER): Payer: 59 | Admitting: Cardiology

## 2017-04-20 ENCOUNTER — Encounter: Payer: Self-pay | Admitting: Cardiology

## 2017-04-20 ENCOUNTER — Other Ambulatory Visit: Payer: Self-pay

## 2017-04-20 VITALS — BP 132/70 | HR 75 | Ht 62.0 in | Wt 293.0 lb

## 2017-04-20 DIAGNOSIS — E782 Mixed hyperlipidemia: Secondary | ICD-10-CM

## 2017-04-20 DIAGNOSIS — R931 Abnormal findings on diagnostic imaging of heart and coronary circulation: Secondary | ICD-10-CM

## 2017-04-20 DIAGNOSIS — I10 Essential (primary) hypertension: Secondary | ICD-10-CM

## 2017-04-20 NOTE — Progress Notes (Signed)
Cardiology Office Note:    Date:  04/20/2017   ID:  Anita Booker, DOB 08/24/1960, MRN [id number]  PCP:  Anita Booker, Robert, MD  Cardiologist:  Anita Brothersajan R Revankar, MD   Referring MD: Anita Booker, Robert, MD    ASSESSMENT:    1. Essential hypertension   2. Mixed dyslipidemia   3. Abnormal nuclear cardiac imaging test   4. Morbid obesity (HCC)    PLAN:    In order of problems listed above:  1. Primary prevention stressed to the patient.  Importance of compliance with diet and medications stressed and she vocalized understanding.  I told her to review her lipid lowering medications with the primary care physician in the light of recent coronary angiography report.  Risks of obesity explained and patient was advised of dieting to reduce weight.  She will now be seen in follow-up appointment only on a as needed basis.   Medication Adjustments/Labs and Tests Ordered: Current medicines are reviewed at length with the patient today.  Concerns regarding medicines are outlined above.  No orders of the defined types were placed in this encounter.  No orders of the defined types were placed in this encounter.    Chief Complaint  Patient presents with  . Follow-up    pt states follow is for her cardiac cath.     History of Present Illness:    Anita Booker is a 57 y.o. female.  The patient was evaluated by me for chest tightness.  She has multiple risk factors for coronary artery disease.  She is morbidly obese, has essential hypertension, dyslipidemia.  She leads a very sedentary lifestyle and because of her comorbidities she is not much ambulatory.  Today she is seen in the clinic coming here with the scooter which she drives powered by a battery.  She is happy with the fact that her evaluation has come out in her favor.  This is a patient who initially presented to me with chest tightness and  because of the aforementioned concerning symptoms she underwent stress testing.  The  stress testing was inconclusive and abnormal.  The patient continued to have symptoms of chest tightness and we discussed with the patient's the findings of the stress test and in the background of significant recurring symptoms of chest tightness, coronary angiography was recommended.  She has undergone the test and is happy with the results of the test.  She continues to have chest tightness on and off.  Past Medical History:  Diagnosis Date  . Arthritis   . Complication of anesthesia 1995   woke up on table right after gallbladder surgery  . Family history of adverse reaction to anesthesia    sister coded twice with anesthesai not sure of details sister still living  . Family history of breast cancer   . Family history of uterine cancer   . Headache    migraines  . Other and unspecified hyperlipidemia   . Unspecified essential hypertension     Past Surgical History:  Procedure Laterality Date  . BREAST SURGERY  07/01/2011.   I & D of right breast abscess  . CHOLECYSTECTOMY  1995  . COLONOSCOPY WITH PROPOFOL N/A 07/05/2016   Procedure: COLONOSCOPY WITH PROPOFOL;  Surgeon: Anita ModenaWilliam Outlaw, MD;  Location: [facility];  Service: Endoscopy;  Laterality: N/A;  . LEFT HEART CATH AND CORONARY ANGIOGRAPHY N/A 03/26/2017   Procedure: LEFT HEART CATH AND CORONARY ANGIOGRAPHY;  Surgeon: Anita KendallEnd, Christopher, MD;  Location: [facility];  Service:  Cardiovascular;  Laterality: N/A;    Current Medications: Current Meds  Medication Sig  . aspirin EC 81 MG tablet Take 81 mg by mouth daily.   Anita Booker atorvastatin (LIPITOR) 40 MG tablet TAKE 1 TABLET DAILY  . Buprenorphine HCl (BELBUCA) 150 MCG FILM Place 150 mcg inside cheek every 12 (twelve) hours.  . Calcium Carbonate (CALCIUM 600 PO) Take 1 tablet by mouth daily.  . celecoxib (CELEBREX) 200 MG capsule Take 1 capsule (200 mg total) daily by mouth.  . cholecalciferol (VITAMIN D) 1000 units tablet Take 1,000 Units by mouth daily.  Anita Booker CONTRAVE 8-90 MG  TB12   . furosemide (LASIX) 40 MG tablet Take 40 mg by mouth daily. Reported on 06/04/2015  . irbesartan (AVAPRO) 150 MG tablet   . lidocaine (LIDODERM) 5 % APPLY 2 PATCHES TO SKIN, REMOVE AFTER 12 HOURS ONCE DAILY  . Multiple Vitamins-Minerals (MULTIVITAMIN WITH MINERALS) tablet Take 1 tablet by mouth daily.  . nitroGLYCERIN (NITROSTAT) 0.4 MG SL tablet Place 1 tablet (0.4 mg total) under the tongue every 5 (five) minutes as needed for chest pain.  . nortriptyline (PAMELOR) 50 MG capsule Take 100 mg by mouth at bedtime. Reported on 06/04/2015  . valsartan (DIOVAN) 160 MG tablet Take 160 mg by mouth daily. Reported on 06/04/2015  . zolpidem (AMBIEN) 10 MG tablet Take 10 mg by mouth at bedtime as needed. Reported on 06/04/2015     Allergies:   Butrans [buprenorphine] and Codeine   Social History   Socioeconomic History  . Marital status: Married    Spouse name: None  . Number of children: None  . Years of education: None  . Highest education level: None  Social Needs  . Financial resource strain: None  . Food insecurity - worry: None  . Food insecurity - inability: None  . Transportation needs - medical: None  . Transportation needs - non-medical: None  Occupational History  . None  Tobacco Use  . Smoking status: Never Smoker  . Smokeless tobacco: Never Used  Substance and Sexual Activity  . Alcohol use: No  . Drug use: No  . Sexual activity: None  Other Topics Concern  . None  Social History Narrative  . None     Family History: The patient's family history includes Breast cancer in her cousin and other; Breast cancer (age of onset: 42) in her sister; Cancer in her father and paternal grandmother; Healthy in her brother and sister; Heart attack (age of onset: 69) in her brother; Liver cancer in her cousin; Lung cancer in her paternal uncle; Lung cancer (age of onset: 18) in her father; Uterine cancer (age of onset: 16) in her mother.  ROS:   Please see the history of present  illness.    All other systems reviewed and are negative.  EKGs/Labs/Other Studies Reviewed:    The following studies were reviewed today: I discussed the findings of the coronary angiography with the patient at extensive length.   Recent Labs: 03/23/2017: BUN 12; Creat 0.63; Hemoglobin 13.8; Platelets 331; Potassium 3.7; Sodium 141  Recent Lipid Panel    Component Value Date/Time   CHOL 149 04/11/2011 0849   TRIG 83.0 04/11/2011 0849   HDL 45.10 04/11/2011 0849   CHOLHDL 3 04/11/2011 0849   VLDL 16.6 04/11/2011 0849   LDLCALC 87 04/11/2011 0849    Physical Exam:    VS:  BP 132/70 (BP Location: Right Arm, Patient Position: Sitting, Cuff Size: Large)   Pulse 75   Ht 5'  2" (1.575 m)   Wt 293 lb (132.9 kg)   SpO2 98%   BMI 53.59 kg/m     Wt Readings from Last 3 Encounters:  04/20/17 293 lb (132.9 kg)  03/26/17 297 lb (134.7 kg)  03/23/17 297 lb (134.7 kg)     GEN: Patient is in no acute distress HEENT: Normal NECK: No JVD; No carotid bruits LYMPHATICS: No lymphadenopathy CARDIAC: Hear sounds regular, 2/6 systolic murmur at the apex. RESPIRATORY:  Clear to auscultation without rales, wheezing or rhonchi  ABDOMEN: Soft, non-tender, non-distended MUSCULOSKELETAL:  No edema; No deformity  SKIN: Warm and dry NEUROLOGIC:  Alert and oriented x 3 PSYCHIATRIC:  Normal affect   Signed, Anita Brothers, MD  04/20/2017 9:38 AM    [facility]

## 2017-04-20 NOTE — Patient Instructions (Signed)
Medication Instructions:  Your physician recommends that you continue on your current medications as directed. Please refer to the Current Medication list given to you today.   Labwork: None  Testing/Procedures: None  Follow-Up: Your physician recommends that you schedule a follow-up appointment in: as needed   Any Other Special Instructions Will Be Listed Below (If Applicable).     If you need a refill on your cardiac medications before your next appointment, please call your pharmacy.   [facility]  Jaquilla Woodroof A, RN, BSN  

## 2017-06-15 ENCOUNTER — Other Ambulatory Visit: Payer: Self-pay | Admitting: *Deleted

## 2017-06-15 MED ORDER — CELECOXIB 200 MG PO CAPS: 200.0000 mg | ORAL_CAPSULE | Freq: Every day | ORAL | 3 refills | Status: DC

## 2017-06-28 ENCOUNTER — Ambulatory Visit: Payer: 59 | Admitting: Physical Medicine & Rehabilitation

## 2017-06-29 ENCOUNTER — Encounter: Payer: Self-pay | Admitting: Physical Medicine & Rehabilitation

## 2017-06-29 ENCOUNTER — Encounter: Payer: 59 | Attending: Physical Medicine & Rehabilitation

## 2017-06-29 ENCOUNTER — Ambulatory Visit (HOSPITAL_BASED_OUTPATIENT_CLINIC_OR_DEPARTMENT_OTHER): Payer: 59 | Admitting: Physical Medicine & Rehabilitation

## 2017-06-29 VITALS — BP 147/92 | HR 75 | Ht 62.0 in | Wt 293.0 lb

## 2017-06-29 DIAGNOSIS — M170 Bilateral primary osteoarthritis of knee: Secondary | ICD-10-CM | POA: Insufficient documentation

## 2017-06-29 MED ORDER — BUPRENORPHINE HCL 150 MCG BU FILM: 150.0000 ug | ORAL_FILM | Freq: Two times a day (BID) | BUCCAL | 2 refills | Status: DC

## 2017-06-29 NOTE — Procedures (Signed)
Knee injection Bilateral with ultrasound guidance)  Indication: Bilateral Knee pain not relieved by medication management and other conservative care.  Informed consent was obtained after describing risks and benefits of the procedure with the patient, this includes bleeding, bruising, infection and medication side effects. The patient wishes to proceed and has given written consent. The patient was placed in a recumbent position. The medial aspect of the Right  knee was marked and prepped with Betadine and alcohol. It was then entered with a 25-gauge 1-1/2 inch needle and 1 mL of 1% lidocaine was injected into the skin and subcutaneous tissue. Then another 22g 80mm ECHO blocneedle was inserted into the knee joint. After negative draw back for blood, a solution containing one ML of 6mg  per mL betamethasone and 3 mL of 1% lidocaine were injected. The same procedure was performed on the left knee using same technique equipment and medication. The patient tolerated the procedure well. Post procedure instructions were given.

## 2017-07-13 ENCOUNTER — Telehealth: Payer: Self-pay

## 2017-07-13 NOTE — Telephone Encounter (Signed)
Pharmacist from [facility] in [place] [place] called about a potential medication interaction for this patient.  He states that the patient is receiving medication called naltrexone from Terri Piedraourtney Forcucci MD phone # [phone] at the same time of buprenorphine which comes from another provider. Wants to know if we should change her medication around since she is on that new medication.

## 2017-07-15 NOTE — Telephone Encounter (Signed)
There is no opiate that is not affected by naltrexone ( which blocks opiate effect)  Only other option in this pt is NSAID which has not been effective We need to see if pt's clinical effect is diminished before changing anything

## 2017-07-26 ENCOUNTER — Telehealth: Payer: Self-pay

## 2017-07-26 NOTE — Telephone Encounter (Signed)
Pt called requesting a early refill on Lidocaine patches, she states ;pharmay will not fill until early next week and she will be leaving Saturday to go out of town for a week. Needs before leaving

## 2017-07-26 NOTE — Telephone Encounter (Signed)
It is ok to get early refill of Lidoderm patches

## 2017-07-27 MED ORDER — LIDOCAINE 5 % EX PTCH: MEDICATED_PATCH | CUTANEOUS | 1 refills | Status: DC

## 2017-07-27 NOTE — Telephone Encounter (Signed)
Left message for the pharmacy to fill early. I spoke with Ms Anita Booker and she said it was not they wouldn't fill, there are no refills. Refills sent.

## 2017-08-24 ENCOUNTER — Ambulatory Visit: Payer: 59 | Admitting: Physical Medicine & Rehabilitation

## 2017-09-03 ENCOUNTER — Encounter: Payer: 59 | Attending: Physical Medicine & Rehabilitation

## 2017-09-03 ENCOUNTER — Encounter: Payer: Self-pay | Admitting: Physical Medicine & Rehabilitation

## 2017-09-03 ENCOUNTER — Ambulatory Visit: Payer: 59 | Admitting: Physical Medicine & Rehabilitation

## 2017-09-03 VITALS — BP 137/91 | HR 95 | Resp 14 | Ht 62.0 in | Wt 293.0 lb

## 2017-09-03 DIAGNOSIS — G8929 Other chronic pain: Secondary | ICD-10-CM | POA: Diagnosis not present

## 2017-09-03 DIAGNOSIS — M170 Bilateral primary osteoarthritis of knee: Secondary | ICD-10-CM | POA: Diagnosis not present

## 2017-09-03 DIAGNOSIS — M25661 Stiffness of right knee, not elsewhere classified: Secondary | ICD-10-CM | POA: Diagnosis not present

## 2017-09-03 DIAGNOSIS — M25562 Pain in left knee: Principal | ICD-10-CM

## 2017-09-03 DIAGNOSIS — M25561 Pain in right knee: Principal | ICD-10-CM

## 2017-09-03 DIAGNOSIS — Z5181 Encounter for therapeutic drug level monitoring: Secondary | ICD-10-CM

## 2017-09-03 DIAGNOSIS — G894 Chronic pain syndrome: Secondary | ICD-10-CM

## 2017-09-03 DIAGNOSIS — Z79891 Long term (current) use of opiate analgesic: Secondary | ICD-10-CM

## 2017-09-03 MED ORDER — BUPRENORPHINE HCL 150 MCG BU FILM: 150.0000 ug | ORAL_FILM | Freq: Two times a day (BID) | BUCCAL | 2 refills | Status: DC

## 2017-09-03 NOTE — Progress Notes (Signed)
Right  genicular nerve blocks under fluoroscopic guidance  Indication Chronic severe post operative knee pain that has not responded to PT, medication, and other conservative care.  Informed consent was obtained after discussing risks and benefits of procedure with the patient.  These include bleeding, bruising and infection as well as foot numbness. Pt place in supine position on the fluoro table .  Static images identified distal femur and proximal tibia.  Lateral and medial supracondylar , as well as medial tibial flare prepped with betadine.  Marked under fluoro and then a 25 g 1.5 inch needle was used to anesthetize skin and subcutaneous tissue. 1.5 cc was infiltrate into each of 3 sites. Then a 22-gauge 3.5 inch needle was inserted under fluoroscopic guidance first targeting the medial tibial flare midpoint. After bone contact was made lateral images confirmed proper positioning and Omnipaque 180 times one ML was injected. This showed no evidence of intravascular uptake. Then 1.5 ML of the solution containing one ML of Celestone 6 mg per mL with 4 ML of 2% lidocaine was injected. This same procedure was treated for the lateral and medial supracondylar areas. Patient tolerated procedure well. Post procedure instructions given.

## 2017-09-03 NOTE — Progress Notes (Signed)
  PROCEDURE RECORD [facility]   Name: Anita EmmsWanda Elaine McSweeney DOB:02/28/1961 MRN: [id number]  Date:09/03/2017  Physician: Claudette LawsAndrew Kirsteins, MD    Nurse/CMA: Bright, CMA  Allergies:  Allergies  Allergen Reactions  . Butrans [Buprenorphine] Rash    Patch causes severe skin rash  . Codeine Nausea Only    Consent Signed: Yes.    Is patient diabetic? No.  CBG today? NA  Pregnant: No. LMP: No LMP recorded. Patient is postmenopausal. (age 58-55)  Anticoagulants: no Anti-inflammatory: yes (celebrex 09/02/2017) Antibiotics: no  Procedure: right genicular nerve block  Position: Supine   Start Time: 1118am End Time: 1130am Fluoro Time: 50s  RN/CMA Delaine Hernandez, CMA Bright, CMA    Time 11:00pm 1140am    BP 137/91 159/92    Pulse 95 72    Respirations 14 14    O2 Sat 94 95    S/S 6 6    Pain Level 8/10 7/10     D/C home with driver requirement waived, patient A & O X 3, D/C instructions reviewed, and sits independently.

## 2017-09-03 NOTE — Patient Instructions (Signed)
WE ARE LOOKING FOR A 50% IMPROVEMENT IN PAIN

## 2017-09-04 ENCOUNTER — Ambulatory Visit (INDEPENDENT_AMBULATORY_CARE_PROVIDER_SITE_OTHER): Payer: 59 | Admitting: Sports Medicine

## 2017-09-04 VITALS — BP 128/76 | Ht 62.0 in | Wt 293.0 lb

## 2017-09-04 DIAGNOSIS — M25661 Stiffness of right knee, not elsewhere classified: Secondary | ICD-10-CM

## 2017-09-04 DIAGNOSIS — G8929 Other chronic pain: Secondary | ICD-10-CM | POA: Diagnosis not present

## 2017-09-04 DIAGNOSIS — M25561 Pain in right knee: Secondary | ICD-10-CM

## 2017-09-05 ENCOUNTER — Ambulatory Visit
Admission: RE | Admit: 2017-09-05 | Discharge: 2017-09-05 | Disposition: A | Discharge status: Home / Self Care | Payer: 59 | Source: Ambulatory Visit | Attending: Sports Medicine | Admitting: Sports Medicine

## 2017-09-05 ENCOUNTER — Ambulatory Visit: Payer: 59

## 2017-09-05 DIAGNOSIS — M25561 Pain in right knee: Principal | ICD-10-CM

## 2017-09-05 DIAGNOSIS — G8929 Other chronic pain: Secondary | ICD-10-CM

## 2017-09-05 NOTE — Progress Notes (Addendum)
   Subjective:    Patient ID: Anita Booker, female    DOB: 04/28/1960, 57 y.o.   MRN: [id number]  HPI chief complaint: Right knee pain  57 year old female with known history of end-stage bilateral knee DJD comes in today complaining of worsening right knee pain and decreased range of motion. Symptoms have been present for the past couple of weeks. She denies any injury although it should be noted that the patient fell after tripping over a mat coming into our office today and landing directly on both knees. However, the patient states that her decreased range of motion was present prior to today's fall. It began suddenly without any inciting event. She is not eligible for total knee arthroplasty given her morbid obesity. Her chronic pain is being treated by Dr.Kirsteins. She is status post right knee genicular nerve block done by him yesterday which was helping her pain but not affecting her range of motion.  Interim medical history is reviewed Medications are reviewed Allergies are reviewed   Review of Systems As above    Objective:   Physical Exam  Morbidly obese.No acute distress.  Examination of both knees is difficult due to body habitus. Difficult to tell whether or not there is an effusion present in either knee. She has range of motion from 0-115 degrees on the left. She is only able to actively extend the right knee to about 45 degrees. She can flex it to 90 degrees. She does appear to have an intact extensor mechanism as she is able to actively fire her quads and extend her right lower leg against resistance when seated with the knee at 90. The knees are grossly stable to ligamentous exam. She is neurovascularly intact distally. She walks with a significant antalgic gait.      Assessment & Plan:   Decreased right knee range of motion-rule out large loose body versus internal derangment versus tendon tear Chronic bilateral knee pain secondary to end-stage inoperable  DJD Morbid obesity  I'm going to start with x-rays of the right knee. If x-rays are unremarkable, we will proceed with an MRI. Once I review those studies I will contact the patient to delineate further treatment. She'll continue to follow-up with Dr. Wynn BankerKirsteins as scheduled for chronic pain management. In the meantime, I've strongly encouraged the patient to use some sort of assistive device to help with ambulation whether that be a walker or a scooter to help prevent future falls. (she claims to have both of these at home).  Addendum: X-ray of the right knee shows severe end-stage medial compartment DJD. It is difficult to rule out a loose body on the lateral view. Proceed with MRI as scheduled.

## 2017-09-07 LAB — DRUG TOX MONITOR 1 W/CONF, ORAL FLD
AMPHETAMINES: NEGATIVE ng/mL (ref <10)
BARBITURATES: NEGATIVE ng/mL (ref <10)
BUPRENORPHINE: 9.64 ng/mL — AB (ref <0.10)
BUPRENORPHINE: POSITIVE ng/mL — AB (ref <0.10)
Benzodiazepines: NEGATIVE ng/mL (ref <0.50)
Cocaine: NEGATIVE ng/mL (ref <5.0)
Fentanyl: NEGATIVE ng/mL (ref <0.10)
HEROIN METABOLITE: NEGATIVE ng/mL (ref <1.0)
MARIJUANA: NEGATIVE ng/mL (ref <2.5)
MDMA: NEGATIVE ng/mL (ref <10)
METHADONE: NEGATIVE ng/mL (ref <5.0)
Meprobamate: NEGATIVE ng/mL (ref <2.5)
NALOXONE: NEGATIVE ng/mL (ref <0.25)
Nicotine Metabolite: NEGATIVE ng/mL (ref <5.0)
Norbuprenorphine: NEGATIVE ng/mL (ref <0.50)
OPIATES: NEGATIVE ng/mL (ref <2.5)
Phencyclidine: NEGATIVE ng/mL (ref <10)
Tapentadol: NEGATIVE ng/mL (ref <5.0)
Tramadol: NEGATIVE ng/mL (ref <5.0)
ZOLPIDEM: NEGATIVE ng/mL (ref <5.0)

## 2017-09-07 LAB — DRUG TOX ALC METAB W/CON, ORAL FLD: ALCOHOL METABOLITE: NEGATIVE ng/mL (ref <25)

## 2017-09-28 ENCOUNTER — Other Ambulatory Visit: Payer: Self-pay | Admitting: Physical Medicine & Rehabilitation

## 2017-10-05 ENCOUNTER — Encounter: Payer: Self-pay | Admitting: Physical Medicine & Rehabilitation

## 2017-10-05 ENCOUNTER — Ambulatory Visit (HOSPITAL_BASED_OUTPATIENT_CLINIC_OR_DEPARTMENT_OTHER): Payer: 59 | Admitting: Physical Medicine & Rehabilitation

## 2017-10-05 ENCOUNTER — Encounter: Payer: 59 | Attending: Physical Medicine & Rehabilitation

## 2017-10-05 VITALS — BP 110/77 | HR 83 | Ht 61.0 in | Wt 293.0 lb

## 2017-10-05 DIAGNOSIS — M170 Bilateral primary osteoarthritis of knee: Secondary | ICD-10-CM | POA: Insufficient documentation

## 2017-10-05 DIAGNOSIS — M25562 Pain in left knee: Secondary | ICD-10-CM | POA: Diagnosis not present

## 2017-10-05 DIAGNOSIS — M25561 Pain in right knee: Secondary | ICD-10-CM

## 2017-10-05 DIAGNOSIS — G8929 Other chronic pain: Secondary | ICD-10-CM | POA: Diagnosis not present

## 2017-10-05 MED ORDER — BUPRENORPHINE HCL 300 MCG BU FILM: 300.0000 ug | ORAL_FILM | Freq: Two times a day (BID) | BUCCAL | 2 refills | Status: DC

## 2017-10-05 NOTE — Progress Notes (Signed)
Subjective:    Patient ID: Anita Booker, female    DOB: 1961-01-26, 57 y.o.   MRN: [id number]  HPI 57 year old female with history of end-stage osteoarthritis bilateral knees he is not a surgical candidate secondary to her obesity.  She has had intra-articular injections of both corticosteroids and Visco supplementation of both knees initially with good results but over time her duration of relief has diminished to approximately 1 month. Patient underwent genicular nerve blocks of the right knee under fluoroscopic guidance.  Patient had very good results with this her pain is diminished on the right side.  She now notices the left knee as being the more painful one.  She is still severely limited in her range of motion of the right lower extremity.  She is followed up with Dr. Margaretha Booker from sports medicine.  Had a fall in 09/04/2017 in the sports medicine office, x-rays were negative.  She is pending MRI  She remains on Belbuca, buprenorphine transbuccal film without side effects, she does get some decent relief with this medication but feels like she may need a higher dose.  Patient is here in a scooter.  She works part-time as a Patent examiner.  She does not use an assistive device when she works Pain Inventory Average Pain 8 Pain Right Now 6 My pain is sharp, dull and aching  In the last 24 hours, has pain interfered with the following? General activity 10 Relation with others 7 Enjoyment of life 10 What TIME of day is your pain at its worst? evening Sleep (in general) Fair  Pain is worse with: walking and standing Pain improves with: rest, medication and injections Relief from Meds: 6  Mobility walk without assistance do you drive?  no  Function Do you have any goals in this area?  yes  Neuro/Psych No problems in this area  Prior Studies Any changes since last visit?  no  Physicians involved in your care Any changes since last visit?  no   Family History   Problem Relation Age of Onset  . Lung cancer Father 41  . Cancer Father        lung  . Healthy Brother   . Uterine cancer Mother 3  . Heart attack Brother 48  . Cancer Paternal Grandmother        breast  . Breast cancer Sister 13       identical twin  . Lung cancer Paternal Uncle        all four uncles had lung cancer and were smokers  . Healthy Sister   . Breast cancer Other   . Breast cancer Cousin        paternal cousin dx in her 73s  . Liver cancer Cousin        paternal cousin with hepatitis and liver cancer   Social History   Socioeconomic History  . Marital status: Married    Spouse name: Not on file  . Number of children: Not on file  . Years of education: Not on file  . Highest education level: Not on file  Occupational History  . Not on file  Social Needs  . Financial resource strain: Not on file  . Food insecurity:    Worry: Not on file    Inability: Not on file  . Transportation needs:    Medical: Not on file    Non-medical: Not on file  Tobacco Use  . Smoking status: Never Smoker  . Smokeless tobacco: Never  Used  Substance and Sexual Activity  . Alcohol use: No  . Drug use: No  . Sexual activity: Not on file  Lifestyle  . Physical activity:    Days per week: Not on file    Minutes per session: Not on file  . Stress: Not on file  Relationships  . Social connections:    Talks on phone: Not on file    Gets together: Not on file    Attends religious service: Not on file    Active member of club or organization: Not on file    Attends meetings of clubs or organizations: Not on file    Relationship status: Not on file  Other Topics Concern  . Not on file  Social History Narrative  . Not on file   Past Surgical History:  Procedure Laterality Date  . BREAST SURGERY  07/01/2011.   I & D of right breast abscess  . CHOLECYSTECTOMY  1995  . COLONOSCOPY WITH PROPOFOL N/A 07/05/2016   Procedure: COLONOSCOPY WITH PROPOFOL;  Surgeon: Willis Modena,  MD;  Location: [facility];  Service: Endoscopy;  Laterality: N/A;  . LEFT HEART CATH AND CORONARY ANGIOGRAPHY N/A 03/26/2017   Procedure: LEFT HEART CATH AND CORONARY ANGIOGRAPHY;  Surgeon: Yvonne Kendall, MD;  Location: [facility];  Service: Cardiovascular;  Laterality: N/A;   Past Medical History:  Diagnosis Date  . Arthritis   . Complication of anesthesia 1995   woke up on table right after gallbladder surgery  . Family history of adverse reaction to anesthesia    sister coded twice with anesthesai not sure of details sister still living  . Family history of breast cancer   . Family history of uterine cancer   . Headache    migraines  . Other and unspecified hyperlipidemia   . Unspecified essential hypertension    There were no vitals taken for this visit.  Opioid Risk Score:   Fall Risk Score:  `1  Depression screen PHQ 2/9  Depression screen [facility] 2/9 04/04/2016 07/15/2015 12/14/2014 08/04/2014 02/17/2014 12/09/2013  Decreased Interest 0 1 0 0 0 0  Down, Depressed, Hopeless 0 0 0 0 0 0  PHQ - 2 Score 0 1 0 0 0 0  Altered sleeping - 1 - - - -  Tired, decreased energy - 1 - - - -  Change in appetite - 0 - - - -  Feeling bad or failure about yourself  - 0 - - - -  Trouble concentrating - 0 - - - -  Moving slowly or fidgety/restless - 0 - - - -  Suicidal thoughts - 0 - - - -  PHQ-9 Score - 3 - - - -     Review of Systems  Constitutional: Negative.   HENT: Negative.   Eyes: Negative.   Respiratory: Negative.   Cardiovascular: Negative.   Gastrointestinal: Negative.   Endocrine: Negative.   Genitourinary: Negative.   Musculoskeletal: Positive for arthralgias, gait problem and myalgias.  Skin: Negative.   Allergic/Immunologic: Negative.   Hematological: Negative.   Psychiatric/Behavioral: Negative.   All other systems reviewed and are negative.      Objective:   Physical Exam  Constitutional: She is oriented to person, place, and time. She appears  well-developed and well-nourished. No distress.  HENT:  Head: Normocephalic and atraumatic.  Eyes: Pupils are equal, round, and reactive to light. EOM are normal.  Neurological: She is alert and oriented to person, place, and time.  Skin: She  is not diaphoretic.  Psychiatric: She has a normal mood and affect.  Nursing note and vitals reviewed.  Right knee no evidence of effusion she has some medial joint line tenderness as well as patellar pain. Number extension is full however flexion is limited to approximately 80 degrees. Left lower extremity has no evidence of effusion medial joint line tenderness as well as patellar tendon tenderness.  Extension is full flexion is to 120 degrees.       Assessment & Plan:  1.  Bilateral end-stage osteoarthritis of the knees.  Fortunately has had good results with right genicular nerve block.  As we discussed cannot predict the duration of response.  This can be repeated when pain recurs.  If she gets a good but temporary relief after the second block would recommend radiofrequency neurotomy.  At this point she feels her left lower extremity is more symptomatic and would like a genicular nerve block for the left lower.  We will schedule this. We discussed her pain medication.  Baseline pain still 8 out of 10, worse at night when she is trying to rest.  Will increase her Belbuca to 300 mcg every 12 hours.  I do not think we will need to go up from here as we will try using interventional procedures to limit pain

## 2017-10-26 ENCOUNTER — Telehealth: Payer: Self-pay | Admitting: *Deleted

## 2017-10-26 NOTE — Telephone Encounter (Signed)
I left a VM per DPR for Anita Booker.  It appears she has never brought her Belbuca patches to her appts. This is a requirement of controlled substances.  A courtesy reminder was left for her.

## 2017-10-29 ENCOUNTER — Encounter: Payer: Self-pay | Admitting: Physical Medicine & Rehabilitation

## 2017-10-29 ENCOUNTER — Ambulatory Visit (HOSPITAL_BASED_OUTPATIENT_CLINIC_OR_DEPARTMENT_OTHER): Payer: 59 | Admitting: Physical Medicine & Rehabilitation

## 2017-10-29 ENCOUNTER — Encounter: Payer: 59 | Attending: Physical Medicine & Rehabilitation

## 2017-10-29 VITALS — BP 153/85 | HR 84 | Resp 14 | Ht 61.0 in | Wt 293.0 lb

## 2017-10-29 DIAGNOSIS — M170 Bilateral primary osteoarthritis of knee: Secondary | ICD-10-CM | POA: Insufficient documentation

## 2017-10-29 NOTE — Patient Instructions (Signed)
Genicular nerve blocks were performed today. This is to block pain signals from the knee. A local anesthetic was used to block the knee therefore this will not be a permanent procedure. Please keep track of your pain today and compared to the pain that you had prior to the injection. At next visit we will discuss the results. If it is helpful but only short-term we would need to confirm this with an additional injection prior to proceeding with a radiofrequency neurotomy  °

## 2017-10-29 NOTE — Progress Notes (Signed)
  PROCEDURE RECORD [facility]   Name: Anita Booker DOB:01/04/1961 MRN: [id number]  Date:10/29/2017  Physician: Claudette LawsAndrew Kirsteins, MD    Nurse/CMA: Michelangelo Rindfleisch, CMA  Allergies:  Allergies  Allergen Reactions  . Butrans [Buprenorphine] Rash    Patch causes severe skin rash  . Codeine Nausea Only    Consent Signed: Yes.    Is patient diabetic? No.  CBG today?   Pregnant: No. LMP: No LMP recorded. Patient is postmenopausal. (age 58-55)  Anticoagulants: no Anti-inflammatory: no Antibiotics: no  Procedure: right genicular nerve block  Position: Prone Start Time: 12:02am  End Time: 12:16pm  Fluoro Time: 44  RN/CMA Tremont Gavitt, CMA Dandy Lazaro, CMA    Time 11:45am 12:22pm    BP 153/85 155/99    Pulse 84 72    Respirations 14 14    O2 Sat 97 96    S/S 6 6    Pain Level 5/10 3/10     D/C home with self, patient A & O X 3, D/C instructions reviewed, and sits independently.

## 2017-10-29 NOTE — Progress Notes (Signed)
Right  genicular nerve blocks under fluoroscopic guidance  Indication Chronic severe post operative knee pain that has not responded to PT, medication, and other conservative care.  Informed consent was obtained after discussing risks and benefits of procedure with the patient.  These include bleeding, bruising and infection as well as foot numbness. Pt place in supine position on the fluoro table .  Static images identified distal femur and proximal tibia.  Lateral and medial supracondylar , as well as medial tibial flare prepped with betadine.  Marked under fluoro and then a 25 g 1.5 inch needle was used to anesthetize skin and subcutaneous tissue. 1.5 cc was infiltrate into each of 3 sites. Then a 22-gauge 3.5 inch needle was inserted under fluoroscopic guidance first targeting the medial tibial flare midpoint. After bone contact was made lateral images confirmed proper positioning and Omnipaque 180 times one ML was injected. This showed no evidence of intravascular uptake. Then 1.5 ML of the solution containing one ML of Celestone 6 mg per mL with 4 ML of .25%marcaine was injected. This same procedure was treated for the lateral and medial supracondylar areas. Patient tolerated procedure well. Post procedure instructions given.

## 2017-11-23 ENCOUNTER — Ambulatory Visit: Payer: 59

## 2017-11-26 ENCOUNTER — Encounter: Payer: Self-pay | Admitting: Physical Medicine & Rehabilitation

## 2017-11-26 ENCOUNTER — Ambulatory Visit (HOSPITAL_BASED_OUTPATIENT_CLINIC_OR_DEPARTMENT_OTHER): Payer: 59 | Admitting: Physical Medicine & Rehabilitation

## 2017-11-26 ENCOUNTER — Encounter: Payer: 59 | Attending: Physical Medicine & Rehabilitation

## 2017-11-26 VITALS — BP 128/86 | HR 66 | Resp 14 | Ht 61.0 in | Wt 293.0 lb

## 2017-11-26 DIAGNOSIS — M170 Bilateral primary osteoarthritis of knee: Secondary | ICD-10-CM | POA: Diagnosis present

## 2017-11-26 MED ORDER — LIDOCAINE 5 % EX PTCH: MEDICATED_PATCH | CUTANEOUS | 6 refills | Status: DC

## 2017-11-26 NOTE — Progress Notes (Signed)
  PROCEDURE RECORD [facility]   Name: Edelyn Wilkes DOB:[birth year] MRN: [id number]  Date:11/26/2017  Physician: Claudette Laws, MD    Nurse/CMA: Pistol Kessenich, CMA  Allergies:  Allergies  Allergen Reactions  . Butrans [Buprenorphine] Rash    Patch causes severe skin rash  . Codeine Nausea Only    Consent Signed: Yes.    Is patient diabetic? No.  CBG today?   Pregnant: No. LMP: No LMP recorded. Patient is postmenopausal. (age 30-55)  Anticoagulants: no Anti-inflammatory: no Antibiotics: no  Procedure: left genicular  Position: Supine Start Time: 12:59pm  End Time: 1:12pm  Fluoro Time: 37s  RN/CMA Jordain Radin, CMA Ajanay Farve, CMA    Time 12:45pm 1:20pm    BP 128/86 165/98    Pulse 66 67    Respirations 14 14    O2 Sat 95 94    S/S 6 6    Pain Level 5/10 2/10     D/C home with self, patient A & O X 3, D/C instructions reviewed, and sits independently.

## 2017-11-26 NOTE — Progress Notes (Signed)
LEFT Genicular nerve block x 3, Upper medial, Upper lateral , and Lower Medial under fluoroscopic guidance  Indication Chronic post operative pain in the Knee, pain postop total knee replacement which has not responded to conservative management such as physical therapy and medication management  Informed consent was obtained after describing risks and to the procedure to the patient these include bleeding bruising and infection, patient elects to proceed and has given written consent. Patient placed supine on the fluoroscopy table AP images of the knee joint were obtained. A 25-gauge 1.5 inch needle was used to anesthetize the skin and subcutaneous tissue with 1% lidocaine, 1 cc at each of 3 locations. Then a 22-gauge 3.5" spinal needle was inserted targeting the junction of the medial flare of the tibia with the shaft of the tibia, bone contact made and confirmed with lateral imaging. Then Isovue 200  x0.5 mL demonstrated no intravascular uptake followed by injection of 1.5ml .5% bupivacaine. Then the junction of the medial epicondyles of the femur with the femoral shaft was targeted needle was advanced under fluoroscopic guidance until bone contact. Appropriate depth was obtained and confirmed with lateral images. Then Isovue 200 x0.5 mL demonstrated no intravascular uptake followed by injection of 1.5ml of .5% bupivacaine. Then the junction of the lateral femoral condyle with the femoral shaft was targeted. 22-gauge 3.5 inch needle was advanced under fluoroscopic guidance until bone contact. Appropriate depth was confirmed with lateral imaging. 0.5 mL of Isovue 200 injected followed by injection of 1.5 cc of .5% bupivacaine solution. Patient tolerated procedure well. Post procedure instructions given  

## 2017-12-24 ENCOUNTER — Ambulatory Visit: Payer: 59 | Admitting: Physical Medicine & Rehabilitation

## 2017-12-25 ENCOUNTER — Encounter: Payer: Self-pay | Admitting: Physical Medicine & Rehabilitation

## 2017-12-25 ENCOUNTER — Encounter: Payer: 59 | Attending: Physical Medicine & Rehabilitation

## 2017-12-25 ENCOUNTER — Ambulatory Visit (HOSPITAL_BASED_OUTPATIENT_CLINIC_OR_DEPARTMENT_OTHER): Payer: 59 | Admitting: Physical Medicine & Rehabilitation

## 2017-12-25 VITALS — BP 145/84 | HR 66 | Ht 61.0 in | Wt 293.0 lb

## 2017-12-25 DIAGNOSIS — M170 Bilateral primary osteoarthritis of knee: Secondary | ICD-10-CM | POA: Insufficient documentation

## 2017-12-25 MED ORDER — BUPRENORPHINE HCL 300 MCG BU FILM: 300.0000 ug | ORAL_FILM | Freq: Two times a day (BID) | BUCCAL | 2 refills | Status: DC

## 2017-12-25 NOTE — Patient Instructions (Signed)
Genicular nerve blocks were performed today. This is to block pain signals from the knee. A local anesthetic was used to block the knee therefore this will not be a permanent procedure. Please keep track of your pain today and compared to the pain that you had prior to the injection. At next visit we will discuss the results. If it is helpful but only short-term we would need to confirm this with an additional injection prior to proceeding with a radiofrequency neurotomy  °

## 2017-12-25 NOTE — Progress Notes (Signed)
LEFT Genicular nerve block x 3, Upper medial, Upper lateral , and Lower Medial under fluoroscopic guidance  Indication Chronic post operative pain in the Knee, pain postop total knee replacement which has not responded to conservative management such as physical therapy and medication management  Informed consent was obtained after describing risks and to the procedure to the patient these include bleeding bruising and infection, patient elects to proceed and has given written consent. Patient placed supine on the fluoroscopy table AP images of the knee joint were obtained. A 25-gauge 1.5 inch needle was used to anesthetize the skin and subcutaneous tissue with 1% lidocaine, 1 cc at each of 3 locations. Then a 22-gauge 3.5" spinal needle was inserted targeting the junction of the medial flare of the tibia with the shaft of the tibia, bone contact made and confirmed with lateral imaging. Then Isovue 200  x0.5 mL demonstrated no intravascular uptake followed by injection of 1.5ml .5% bupivacaine. Then the junction of the medial epicondyles of the femur with the femoral shaft was targeted needle was advanced under fluoroscopic guidance until bone contact. Appropriate depth was obtained and confirmed with lateral images. Then Isovue 200 x0.5 mL demonstrated no intravascular uptake followed by injection of 1.5ml of .5% bupivacaine. Then the junction of the lateral femoral condyle with the femoral shaft was targeted. 22-gauge 3.5 inch needle was advanced under fluoroscopic guidance until bone contact. Appropriate depth was confirmed with lateral imaging. 0.5 mL of Isovue 200 injected followed by injection of 1.5 cc of .5% bupivacaine solution. Patient tolerated procedure well. Post procedure instructions given  

## 2017-12-25 NOTE — Progress Notes (Signed)
  PROCEDURE RECORD [facility]   Name: Talayah Picardi DOB:1960-06-06 MRN: [id number]  Date:12/25/2017  Physician: Claudette Laws, MD    Nurse/CMA: Damonica Chopra CMA  Allergies:  Allergies  Allergen Reactions  . Butrans [Buprenorphine] Rash    Patch causes severe skin rash  . Codeine Nausea Only    Consent Signed: Yes.    Is patient diabetic? No.  CBG today? NA  Pregnant: No. LMP: No LMP recorded. Patient is postmenopausal. (age 58-55)  Anticoagulants: yes (asa) Anti-inflammatory: yes (celebrex) Antibiotics: no  Procedure: left knee genicular nerve block Position: Supine   Start Time: 11:08am End Time: 11:20pm Fluoro Time: 39s  RN/CMA Lexani Corona CMA Nakesha Ebrahim CMA    Time 11:02am 11:25am    BP 145/84 157/96    Pulse 66 64    Respirations 16 16    O2 Sat 93 96    S/S 6 6    Pain Level 4/10 2/10     D/C home with self, patient A & O X 3, D/C instructions reviewed, and sits independently.

## 2018-01-06 ENCOUNTER — Other Ambulatory Visit: Payer: Self-pay | Admitting: Physical Medicine & Rehabilitation

## 2018-01-11 ENCOUNTER — Ambulatory Visit: Payer: 59 | Admitting: Physical Medicine & Rehabilitation

## 2018-01-11 ENCOUNTER — Encounter

## 2018-01-24 ENCOUNTER — Ambulatory Visit: Payer: 59 | Admitting: Physical Medicine & Rehabilitation

## 2018-01-25 ENCOUNTER — Ambulatory Visit (HOSPITAL_BASED_OUTPATIENT_CLINIC_OR_DEPARTMENT_OTHER): Payer: 59 | Admitting: Physical Medicine & Rehabilitation

## 2018-01-25 ENCOUNTER — Encounter: Payer: 59 | Attending: Physical Medicine & Rehabilitation

## 2018-01-25 VITALS — BP 118/79 | HR 87 | Wt 293.0 lb

## 2018-01-25 DIAGNOSIS — Z5181 Encounter for therapeutic drug level monitoring: Secondary | ICD-10-CM

## 2018-01-25 DIAGNOSIS — M25561 Pain in right knee: Secondary | ICD-10-CM

## 2018-01-25 DIAGNOSIS — M170 Bilateral primary osteoarthritis of knee: Secondary | ICD-10-CM | POA: Diagnosis present

## 2018-01-25 DIAGNOSIS — Z79891 Long term (current) use of opiate analgesic: Secondary | ICD-10-CM

## 2018-01-25 DIAGNOSIS — G8929 Other chronic pain: Secondary | ICD-10-CM

## 2018-01-25 NOTE — Progress Notes (Signed)
RIGHT  genicular nerve radiofrequency neurotomy under fluoroscopic guidance  Indication Chronic severe  knee pain that has not responded to PT, medication, and other conservative care.THere has been 50% relief of usual knee pain with each of 2 sets of genicular nerve blocks under fluoroscopic guidance  Informed consent was obtained after discussing risks and benefits of procedure with the patient.  These include bleeding, bruising and infection as well as foot numbness. Pt place in supine position on the fluoro table .  Static images identified distal femur and proximal tibia.  Lateral and medial supracondylar , as well as medial tibial flare prepped with betadine.  Marked under fluoro and then a 25 g 1.5 inch needle was used to anesthetize skin and subcutaneous tissue. 1.5 cc was infiltrate into each of 3 sites. Then a 20-gauge 10cm RF needle with a 1cm curve tip  was inserted under fluoroscopic guidance first targeting the medial tibial flare midpoint. After bone contact was made lateral images confirmed proper positioning at midpoint of shart ,then Motor stim at 2 Hz demonstrated no motor twitch followed by injection of 1ml of 1% lidocaine and radiofrequency lesioning 80 deg for 90 sec. . This same procedure was treated for the lateral and medial supracondylar areas using same equipment and technique. Patient tolerated procedure well. Post procedure instructions given.

## 2018-01-25 NOTE — Patient Instructions (Signed)
You had a radio frequency procedure today This was done to alleviate joint pain in your lumbar area We injected lidocaine which is a local anesthetic.  You may experience soreness at the injection sites. You may also experienced some irritation of the nerves that were heated I'm recommending ice for 30 minutes every 2 hours as needed for the next 24-48 hours   

## 2018-01-25 NOTE — Progress Notes (Addendum)
  PROCEDURE RECORD [facility]   Name: Izabella Marcantel DOB:08/01/1960 MRN: [id number]  Date:01/25/2018  Physician: Claudette Laws, MD    Nurse/CMA: Nedra Hai, CMA  Allergies:  Allergies  Allergen Reactions  . Butrans [Buprenorphine] Rash    Patch causes severe skin rash  . Codeine Nausea Only    Consent Signed: Yes.    Is patient diabetic? No.  CBG today?   Pregnant: No. LMP: No LMP recorded. Patient is postmenopausal. (age 58-55)  Anticoagulants: no Anti-inflammatory: yes (Celebrex) Antibiotics: no  Procedure: Right Genicular RF Position: Prone Start Time: 12:45pm End Time: 1:20pm Fluoro Time:54  RN/CMA Nedra Hai, CMA Dontavia Brand, CMA    Time 12:30pm 1:26pm    BP 118/79 142/83    Pulse 87 79    Respirations 16 16    O2 Sat 96 98    S/S 6 6    Pain Level 6/10 8/10     D/C home with No one, patient A & O X 3, D/C instructions reviewed, and sits independently.

## 2018-01-29 LAB — DRUG TOX MONITOR 1 W/CONF, ORAL FLD
Amphetamines: NEGATIVE ng/mL (ref <10)
BENZODIAZEPINES: NEGATIVE ng/mL (ref <0.50)
BUPRENORPHINE: NEGATIVE ng/mL (ref <0.10)
Barbiturates: NEGATIVE ng/mL (ref <10)
Cocaine: NEGATIVE ng/mL (ref <5.0)
Fentanyl: NEGATIVE ng/mL (ref <0.10)
Heroin Metabolite: NEGATIVE ng/mL (ref <1.0)
MARIJUANA: NEGATIVE ng/mL (ref <2.5)
MDMA: NEGATIVE ng/mL (ref <10)
METHADONE: NEGATIVE ng/mL (ref <5.0)
Meprobamate: NEGATIVE ng/mL (ref <2.5)
NALOXONE: NEGATIVE ng/mL (ref <0.25)
NICOTINE METABOLITE: NEGATIVE ng/mL (ref <5.0)
NORBUPRENORPHINE: NEGATIVE ng/mL (ref <0.50)
Opiates: NEGATIVE ng/mL (ref <2.5)
Phencyclidine: NEGATIVE ng/mL (ref <10)
Tapentadol: NEGATIVE ng/mL (ref <5.0)
Tramadol: NEGATIVE ng/mL (ref <5.0)
Zolpidem: NEGATIVE ng/mL (ref <5.0)

## 2018-01-29 LAB — DRUG TOX ALC METAB W/CON, ORAL FLD: ALCOHOL METABOLITE: NEGATIVE ng/mL (ref <25)

## 2018-02-21 ENCOUNTER — Ambulatory Visit: Payer: 59 | Admitting: Physical Medicine & Rehabilitation

## 2018-03-21 ENCOUNTER — Telehealth: Payer: Self-pay | Admitting: *Deleted

## 2018-03-21 NOTE — Telephone Encounter (Signed)
Oral swab was negative for all medication but noted to have insufficient specimen to test all phases.  Repeat next appt with urine test.

## 2018-03-26 ENCOUNTER — Encounter: Payer: Self-pay | Admitting: Physical Medicine & Rehabilitation

## 2018-03-26 ENCOUNTER — Ambulatory Visit (HOSPITAL_BASED_OUTPATIENT_CLINIC_OR_DEPARTMENT_OTHER): Payer: 59 | Admitting: Physical Medicine & Rehabilitation

## 2018-03-26 ENCOUNTER — Encounter

## 2018-03-26 ENCOUNTER — Encounter: Payer: 59 | Attending: Physical Medicine & Rehabilitation

## 2018-03-26 VITALS — BP 145/90 | HR 85 | Wt 293.0 lb

## 2018-03-26 DIAGNOSIS — M170 Bilateral primary osteoarthritis of knee: Secondary | ICD-10-CM | POA: Diagnosis present

## 2018-03-26 DIAGNOSIS — M25562 Pain in left knee: Secondary | ICD-10-CM | POA: Diagnosis not present

## 2018-03-26 DIAGNOSIS — Z5181 Encounter for therapeutic drug level monitoring: Secondary | ICD-10-CM

## 2018-03-26 DIAGNOSIS — G8929 Other chronic pain: Secondary | ICD-10-CM

## 2018-03-26 DIAGNOSIS — G894 Chronic pain syndrome: Secondary | ICD-10-CM | POA: Diagnosis not present

## 2018-03-26 DIAGNOSIS — Z79891 Long term (current) use of opiate analgesic: Secondary | ICD-10-CM | POA: Diagnosis not present

## 2018-03-26 MED ORDER — LIDOCAINE 5 % EX PTCH: MEDICATED_PATCH | CUTANEOUS | 6 refills | Status: DC

## 2018-03-26 MED ORDER — BUPRENORPHINE HCL 300 MCG BU FILM: 300.0000 ug | ORAL_FILM | Freq: Two times a day (BID) | BUCCAL | 2 refills | Status: DC

## 2018-03-26 NOTE — Patient Instructions (Signed)
Please continue efforts at weight loss

## 2018-03-26 NOTE — Progress Notes (Signed)
Subjective:    Patient ID: Anita Booker, female    DOB: Jun 14, 1960, 58 y.o.   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 Getting chair lift installed, I would agree with this given her difficulty with managing steps  Pain Inventory Average Pain 7 Pain Right Now 6 My pain is constant, sharp and dull  In the last 24 hours, has pain interfered with the following? General activity 6 Relation with others 6 Enjoyment of life 6 What TIME of day is your pain at its worst? all Sleep (in general) Poor  Pain is worse with: walking and standing Pain improves with: medication Relief from Meds: 6  Mobility use a walker ability to climb steps?  yes do you drive?  yes use a wheelchair transfers alone  Function employed # of hrs/week 10 I need assistance with the following:  household duties and shopping  Neuro/Psych weakness trouble walking  Prior Studies Any changes since last visit?  no  Physicians involved in your care Any changes since last visit?  no   Family History  Problem Relation Age of Onset  . Lung cancer Father 40  . Cancer Father        lung  . Healthy Brother   . Uterine cancer Mother 5  . Heart attack Brother 48  . Cancer Paternal Grandmother        breast  . Breast cancer Sister 58       identical twin  . Lung cancer Paternal Uncle        all four uncles had lung cancer and were smokers  . Healthy Sister   . Breast cancer Other   . Breast cancer Cousin        paternal cousin dx in her 34s  . Liver cancer Cousin        paternal cousin with hepatitis and liver cancer   Social History   Socioeconomic History  . Marital status: Married    Spouse name: Not on file  . Number of children: Not on file  . Years of education: Not on file  . Highest education level: Not on file  Occupational History  . Not on file  Social Needs  . Financial  resource strain: Not on file  . Food insecurity:    Worry: Not on file    Inability: Not on file  . Transportation needs:    Medical: Not on file    Non-medical: Not on file  Tobacco Use  . Smoking status: Never Smoker  . Smokeless tobacco: Never Used  Substance and Sexual Activity  . Alcohol use: No  . Drug use: No  . Sexual activity: Not on file  Lifestyle  . Physical activity:    Days per week: Not on file    Minutes per session: Not on file  . Stress: Not on file  Relationships  . Social connections:    Talks on phone: Not on file    Gets together: Not on file    Attends religious service: Not on file    Active member of club or organization: Not on file    Attends meetings of clubs or organizations: Not on file    Relationship status: Not on file  Other Topics Concern  . Not on file  Social History Narrative  . Not on file   Past Surgical History:  Procedure  Laterality Date  . BREAST SURGERY  07/01/2011.   I & D of right breast abscess  . CHOLECYSTECTOMY  1995  . COLONOSCOPY WITH PROPOFOL N/A 07/05/2016   Procedure: COLONOSCOPY WITH PROPOFOL;  Surgeon: Willis ModenaWilliam Outlaw, MD;  Location: [facility];  Service: Endoscopy;  Laterality: N/A;  . LEFT HEART CATH AND CORONARY ANGIOGRAPHY N/A 03/26/2017   Procedure: LEFT HEART CATH AND CORONARY ANGIOGRAPHY;  Surgeon: Yvonne KendallEnd, Christopher, MD;  Location: [facility];  Service: Cardiovascular;  Laterality: N/A;   Past Medical History:  Diagnosis Date  . Arthritis   . Complication of anesthesia 1995   woke up on table right after gallbladder surgery  . Family history of adverse reaction to anesthesia    sister coded twice with anesthesai not sure of details sister still living  . Family history of breast cancer   . Family history of uterine cancer   . Headache    migraines  . Other and unspecified hyperlipidemia   . Unspecified essential hypertension    BP (!) 145/90   Pulse 85   Wt 293 lb (132.9 kg)   SpO2 98%   BMI  55.36 kg/m   Opioid Risk Score:   Fall Risk Score:  `1  Depression screen PHQ 2/9  [facility] 04/04/2016 07/15/2015 12/14/2014 08/04/2014 02/17/2014 12/09/2013  Decreased Interest 0 1 0 0 0 0  Down, Depressed, Hopeless 0 0 0 0 0 0  PHQ - 2 Score 0 1 0 0 0 0  Altered sleeping - 1 - - - -  Tired, decreased energy - 1 - - - -  Change in appetite - 0 - - - -  Feeling bad or failure about yourself  - 0 - - - -  Trouble concentrating - 0 - - - -  Moving slowly or fidgety/restless - 0 - - - -  Suicidal thoughts - 0 - - - -  PHQ-9 Score - 3 - - - -     Review of Systems  Constitutional: Negative.   HENT: Negative.   Eyes: Negative.   Respiratory: Negative.   Cardiovascular: Negative.   Gastrointestinal: Negative.   Endocrine: Negative.   Genitourinary: Negative.   Musculoskeletal: Positive for gait problem and joint swelling.  Skin: Negative.   Allergic/Immunologic: Negative.   Neurological: Positive for weakness.  Hematological: Negative.   Psychiatric/Behavioral: Negative.   All other systems reviewed and are negative.      Objective:   Physical Exam Vitals signs and nursing note reviewed.  Constitutional:      Appearance: She is obese.  HENT:     Head: Normocephalic and atraumatic.     Nose: Nose normal.     Mouth/Throat:     Mouth: Mucous membranes are moist.  Eyes:     General: No scleral icterus.    Extraocular Movements: Extraocular movements intact.     Conjunctiva/sclera: Conjunctivae normal.     Pupils: Pupils are equal, round, and reactive to light.  Musculoskeletal:     Right knee: She exhibits decreased range of motion. She exhibits no effusion. Tenderness found. Medial joint line tenderness noted.     Left knee: She exhibits decreased range of motion. She exhibits no effusion. Tenderness found. Medial joint line tenderness noted.     Comments: Bilateral knee range of motion  -30deg ext Flexion to 60deg  Neurological:     Mental Status:  She is alert.  Psychiatric:        Mood and Affect:  Mood normal.        Behavior: Behavior normal.           Assessment & Plan:  1.  Severe end stage osteoarthritis of the knees with limited ROM  Remains functional with current pain regimen of  Belbuca q 12H Also takes Celebrex 200mg  daily prescribed by Sport med MD  Agree with Stair lift  Left knee responded well to genicular nerve blocks , would benefit from upper medial , upper lateral, lower medial genicular nerve radiofrequency neurotomy  Right knee did get relief with genicular RF procedure on 01/25/18

## 2018-04-01 LAB — DRUG TOX MONITOR 1 W/CONF, ORAL FLD
Amphetamines: NEGATIVE ng/mL (ref <10)
BENZODIAZEPINES: NEGATIVE ng/mL (ref <0.50)
BUPRENORPHINE: POSITIVE ng/mL — AB (ref <0.10)
Barbiturates: NEGATIVE ng/mL (ref <10)
Buprenorphine: >25 ng/mL — ABNORMAL HIGH (ref <0.10)
Cocaine: NEGATIVE ng/mL (ref <5.0)
Fentanyl: NEGATIVE ng/mL (ref <0.10)
Heroin Metabolite: NEGATIVE ng/mL (ref <1.0)
MARIJUANA: NEGATIVE ng/mL (ref <2.5)
MDMA: NEGATIVE ng/mL (ref <10)
METHADONE: NEGATIVE ng/mL (ref <5.0)
Meprobamate: NEGATIVE ng/mL (ref <2.5)
NICOTINE METABOLITE: NEGATIVE ng/mL (ref <5.0)
NORBUPRENORPHINE: NEGATIVE ng/mL (ref <0.50)
Naloxone: NEGATIVE ng/mL (ref <0.25)
Opiates: NEGATIVE ng/mL (ref <2.5)
Phencyclidine: NEGATIVE ng/mL (ref <10)
TRAMADOL: NEGATIVE ng/mL (ref <5.0)
Tapentadol: NEGATIVE ng/mL (ref <5.0)
Zolpidem: NEGATIVE ng/mL (ref <5.0)

## 2018-04-01 LAB — DRUG TOX ALC METAB W/CON, ORAL FLD: ALCOHOL METABOLITE: NEGATIVE ng/mL (ref <25)

## 2018-04-02 ENCOUNTER — Other Ambulatory Visit: Payer: Self-pay | Admitting: Sports Medicine

## 2018-04-05 ENCOUNTER — Ambulatory Visit: Payer: 59 | Admitting: Sports Medicine

## 2018-04-15 ENCOUNTER — Other Ambulatory Visit: Payer: Self-pay | Admitting: Sports Medicine

## 2018-04-15 ENCOUNTER — Ambulatory Visit
Admission: RE | Admit: 2018-04-15 | Discharge: 2018-04-15 | Disposition: A | Discharge status: Home / Self Care | Payer: 59 | Source: Ambulatory Visit | Attending: Sports Medicine | Admitting: Sports Medicine

## 2018-04-15 ENCOUNTER — Encounter: Payer: Self-pay | Admitting: Sports Medicine

## 2018-04-15 ENCOUNTER — Ambulatory Visit (INDEPENDENT_AMBULATORY_CARE_PROVIDER_SITE_OTHER): Payer: 59 | Admitting: Sports Medicine

## 2018-04-15 VITALS — BP 141/92 | Temp 97.0°F | Ht 61.0 in | Wt 293.0 lb

## 2018-04-15 DIAGNOSIS — M25552 Pain in left hip: Principal | ICD-10-CM

## 2018-04-15 DIAGNOSIS — M160 Bilateral primary osteoarthritis of hip: Secondary | ICD-10-CM | POA: Diagnosis not present

## 2018-04-15 DIAGNOSIS — M25551 Pain in right hip: Secondary | ICD-10-CM

## 2018-04-15 DIAGNOSIS — M170 Bilateral primary osteoarthritis of knee: Secondary | ICD-10-CM

## 2018-04-16 ENCOUNTER — Other Ambulatory Visit: Payer: Self-pay | Admitting: Obstetrics and Gynecology

## 2018-04-16 ENCOUNTER — Encounter: Payer: Self-pay | Admitting: Sports Medicine

## 2018-04-16 DIAGNOSIS — Z1231 Encounter for screening mammogram for malignant neoplasm of breast: Secondary | ICD-10-CM

## 2018-04-16 NOTE — Progress Notes (Signed)
   Subjective:    Patient ID: Anita Booker, female    DOB: 05-29-1960, 58 y.o.   MRN: [id number]  HPI chief complaint: Bilateral knee and bilateral hip pain  Patient is a 58 year old female with a known history of end-stage inoperable bilateral knee DJD who comes in today complaining of hip pain as well.  Hip pain has been present for several months and is gradually worsening.  It is diffuse throughout both hips.  She is under the care of Dr. Doroteo Bradford for her chronic pain.  She has had some success with genicular nerve blocks and future blocks are planned as well.  Despite this, her condition continues to deteriorate.  She has fallen twice over the past couple of weeks.  She has had to get a chair lift for her home.  She is also utilizing a scooter.  She has a walker but is requesting a walker with a seat.  She takes Celebrex daily as well as Belbuca every 12 hours.  She is stable on this regimen.  Interim medical history reviewed Medications reviewed Allergies reviewed    Review of Systems    As above Objective:   Physical Exam  Morbidly obese.  No acute distress.  Examination of each hip shows severely limited passive range of motion with reproducible pain with internal and external rotation.  Diffuse weakness throughout both lower extremities Examination of both knees shows limited range of motion secondary to pain and body habitus.  Joints are grossly stable to ligamentous exam.  Patient has difficulty ambulating  X-rays of both hips show advanced degenerative changes in both hips, worse on the left.  No signs of acute injury Previous x-rays have shown end-stage DJD in both knees      Assessment & Plan:   End-stage DJD, both knees Advanced DJD, both hips Morbid obesity Chronic pain from the above 3 diagnoses  Patient has a follow-up appointment with Dr.Kirsteins soon.  Although she may get some temporary relief from intra-articular hip injections I do not think it  will be long-lasting.  Hopefully she will continue to notice some pain relief in her knees with future genicular blocks.  Unfortunately, this patient's morbid obesity makes her arthritis inoperable so pain management is the only real treatment for her.  Her condition has deteriorated to the point to where I think she would be a candidate for disability.  Although reluctant to pursue this, I do think it is reasonable given her situation.  I agree with the stair lift and have given her a prescription for a walker with a seat.  Follow-up as needed.

## 2018-04-23 ENCOUNTER — Encounter: Payer: 59 | Attending: Physical Medicine & Rehabilitation

## 2018-04-23 ENCOUNTER — Other Ambulatory Visit: Payer: Self-pay

## 2018-04-23 ENCOUNTER — Ambulatory Visit (HOSPITAL_BASED_OUTPATIENT_CLINIC_OR_DEPARTMENT_OTHER): Payer: 59 | Admitting: Physical Medicine & Rehabilitation

## 2018-04-23 ENCOUNTER — Encounter: Payer: Self-pay | Admitting: Physical Medicine & Rehabilitation

## 2018-04-23 VITALS — BP 117/79 | HR 91 | Ht 61.0 in | Wt 293.0 lb

## 2018-04-23 DIAGNOSIS — M170 Bilateral primary osteoarthritis of knee: Secondary | ICD-10-CM | POA: Insufficient documentation

## 2018-04-23 DIAGNOSIS — G8929 Other chronic pain: Secondary | ICD-10-CM

## 2018-04-23 DIAGNOSIS — M25562 Pain in left knee: Secondary | ICD-10-CM | POA: Diagnosis not present

## 2018-04-23 NOTE — Progress Notes (Signed)
Subjective:    Patient ID: Anita Booker, female    DOB: 1960/05/31, 58 y.o.   MRN: [id number]  HPI  58 year old female with BMI of 55 who has chronic bilateral knee pain and contracture due to osteoarthritis of the knees. The patient has been to orthopedic office where hip x-rays were obtained and severe left and moderate right hip osteoarthritis was diagnosed according to the patient.  She states that she has limitations in the left hip range of motion. She has quit working and is applying for disability. 01/25/2018- Right genicular RF, no sharp stabbing pains since procedure Activities of daily less painful on the right side since the procedure  Patient with end stage osteoarthritis of both knees currently not a candidate for surgery due to BMI 55  Pain is only partially responsive to medication management including narcotic analgesics  Left knee pain has improved on two occasion by >50%  After Left genicular nerve blocks on 11/26/2017 as well as 12/25/2017   Pain Inventory Average Pain 6 Pain Right Now 7 My pain is sharp, dull and aching  In the last 24 hours, has pain interfered with the following? General activity 9 Relation with others 8 Enjoyment of life 9 What TIME of day is your pain at its worst? daytime Sleep (in general) Fair  Pain is worse with: walking and standing Pain improves with: rest, heat/ice, medication and injections Relief from Meds: 5  Mobility walk with assistance use a walker how many minutes can you walk? 2 ability to climb steps?  no do you drive?  yes  Function I need assistance with the following:  dressing  Neuro/Psych weakness trouble walking  Prior Studies Any changes since last visit?  no  Physicians involved in your care Any changes since last visit?  no   Family History  Problem Relation Age of Onset  . Lung cancer Father 61  . Cancer Father        lung  . Healthy Brother   . Uterine cancer Mother 98  . Heart  attack Brother 48  . Cancer Paternal Grandmother        breast  . Breast cancer Sister 5       identical twin  . Lung cancer Paternal Uncle        all four uncles had lung cancer and were smokers  . Healthy Sister   . Breast cancer Other   . Breast cancer Cousin        paternal cousin dx in her 27s  . Liver cancer Cousin        paternal cousin with hepatitis and liver cancer   Social History   Socioeconomic History  . Marital status: Married    Spouse name: Not on file  . Number of children: Not on file  . Years of education: Not on file  . Highest education level: Not on file  Occupational History  . Not on file  Social Needs  . Financial resource strain: Not on file  . Food insecurity:    Worry: Not on file    Inability: Not on file  . Transportation needs:    Medical: Not on file    Non-medical: Not on file  Tobacco Use  . Smoking status: Never Smoker  . Smokeless tobacco: Never Used  Substance and Sexual Activity  . Alcohol use: No  . Drug use: No  . Sexual activity: Not on file  Lifestyle  . Physical activity:    Days  per week: Not on file    Minutes per session: Not on file  . Stress: Not on file  Relationships  . Social connections:    Talks on phone: Not on file    Gets together: Not on file    Attends religious service: Not on file    Active member of club or organization: Not on file    Attends meetings of clubs or organizations: Not on file    Relationship status: Not on file  Other Topics Concern  . Not on file  Social History Narrative  . Not on file   Past Surgical History:  Procedure Laterality Date  . BREAST SURGERY  07/01/2011.   I & D of right breast abscess  . CHOLECYSTECTOMY  1995  . COLONOSCOPY WITH PROPOFOL N/A 07/05/2016   Procedure: COLONOSCOPY WITH PROPOFOL;  Surgeon: Willis ModenaWilliam Outlaw, MD;  Location: [facility];  Service: Endoscopy;  Laterality: N/A;  . LEFT HEART CATH AND CORONARY ANGIOGRAPHY N/A 03/26/2017   Procedure: LEFT  HEART CATH AND CORONARY ANGIOGRAPHY;  Surgeon: Yvonne KendallEnd, Christopher, MD;  Location: [facility];  Service: Cardiovascular;  Laterality: N/A;   Past Medical History:  Diagnosis Date  . Arthritis   . Complication of anesthesia 1995   woke up on table right after gallbladder surgery  . Family history of adverse reaction to anesthesia    sister coded twice with anesthesai not sure of details sister still living  . Family history of breast cancer   . Family history of uterine cancer   . Headache    migraines  . Other and unspecified hyperlipidemia   . Unspecified essential hypertension    BP 117/79   Pulse 91   Ht 5\' 1"  (1.549 m) Comment: last reported  Wt 293 lb (132.9 kg) Comment: last reported  SpO2 94%   BMI 55.36 kg/m   Opioid Risk Score:   Fall Risk Score:  `1  Depression screen PHQ 2/9  Depression screen [facility] 04/23/2018 04/04/2016 07/15/2015 12/14/2014 08/04/2014 02/17/2014 12/09/2013  Decreased Interest 0 0 1 0 0 0 0  Down, Depressed, Hopeless 0 0 0 0 0 0 0  PHQ - 2 Score 0 0 1 0 0 0 0  Altered sleeping - - 1 - - - -  Tired, decreased energy - - 1 - - - -  Change in appetite - - 0 - - - -  Feeling bad or failure about yourself  - - 0 - - - -  Trouble concentrating - - 0 - - - -  Moving slowly or fidgety/restless - - 0 - - - -  Suicidal thoughts - - 0 - - - -  PHQ-9 Score - - 3 - - - -    Review of Systems  Constitutional: Negative.   HENT: Negative.   Respiratory: Negative.   Cardiovascular: Negative.   Gastrointestinal: Negative.   Endocrine: Negative.   Genitourinary: Negative.   Musculoskeletal: Positive for gait problem.  Skin: Negative.   Allergic/Immunologic: Negative.   Neurological: Positive for weakness.  Hematological: Negative.   All other systems reviewed and are negative.      Objective:   Physical Exam Vitals signs and nursing note reviewed.  Constitutional:      Appearance: Normal appearance. She is obese.     Comments: In motorized  scooter  HENT:     Head: Normocephalic and atraumatic.     Mouth/Throat:     Mouth: Mucous membranes are moist.  Eyes:  Extraocular Movements: Extraocular movements intact.     Conjunctiva/sclera: Conjunctivae normal.     Pupils: Pupils are equal, round, and reactive to light.  Musculoskeletal:     Right knee: She exhibits decreased range of motion. She exhibits no effusion. Tenderness found. Medial joint line and lateral joint line tenderness noted.     Left knee: She exhibits decreased range of motion. She exhibits no effusion. Tenderness found. Medial joint line and lateral joint line tenderness noted.     Comments: Diminished hip internal extra rotation left greater than right.  Skin:    General: Skin is warm and dry.  Neurological:     General: No focal deficit present.     Mental Status: She is alert and oriented to person, place, and time.  Psychiatric:        Mood and Affect: Mood normal.        Behavior: Behavior normal.           Assessment & Plan:  1.  End-stage osteo-arthritis bilateral knees with bilateral knee contracture. Her right knee is doing better than her left knee following the radiofrequency neurotomy performed 01/25/2018. Her left knee is having shooting pains which limit ADL function.  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

## 2018-05-09 ENCOUNTER — Telehealth: Payer: Self-pay | Admitting: *Deleted

## 2018-05-09 NOTE — Telephone Encounter (Signed)
Medical records faxed to [facility]; [place] [id number]

## 2018-05-15 ENCOUNTER — Ambulatory Visit
Admission: RE | Admit: 2018-05-15 | Discharge: 2018-05-15 | Disposition: A | Discharge status: Home / Self Care | Payer: 59 | Source: Ambulatory Visit | Attending: Obstetrics and Gynecology | Admitting: Obstetrics and Gynecology

## 2018-05-15 DIAGNOSIS — Z1231 Encounter for screening mammogram for malignant neoplasm of breast: Secondary | ICD-10-CM

## 2018-05-16 ENCOUNTER — Other Ambulatory Visit: Payer: Self-pay | Admitting: Obstetrics and Gynecology

## 2018-05-16 DIAGNOSIS — R928 Other abnormal and inconclusive findings on diagnostic imaging of breast: Secondary | ICD-10-CM

## 2018-05-21 ENCOUNTER — Ambulatory Visit (HOSPITAL_BASED_OUTPATIENT_CLINIC_OR_DEPARTMENT_OTHER): Payer: 59 | Admitting: Physical Medicine & Rehabilitation

## 2018-05-21 ENCOUNTER — Ambulatory Visit
Admission: RE | Admit: 2018-05-21 | Discharge: 2018-05-21 | Disposition: A | Discharge status: Home / Self Care | Payer: 59 | Source: Ambulatory Visit | Attending: Obstetrics and Gynecology | Admitting: Obstetrics and Gynecology

## 2018-05-21 ENCOUNTER — Encounter: Payer: Self-pay | Admitting: Physical Medicine & Rehabilitation

## 2018-05-21 ENCOUNTER — Encounter: Payer: 59 | Attending: Physical Medicine & Rehabilitation

## 2018-05-21 VITALS — BP 147/83 | HR 87 | Ht 61.0 in | Wt 293.0 lb

## 2018-05-21 DIAGNOSIS — M25561 Pain in right knee: Secondary | ICD-10-CM | POA: Diagnosis not present

## 2018-05-21 DIAGNOSIS — R928 Other abnormal and inconclusive findings on diagnostic imaging of breast: Secondary | ICD-10-CM

## 2018-05-21 DIAGNOSIS — M170 Bilateral primary osteoarthritis of knee: Secondary | ICD-10-CM | POA: Insufficient documentation

## 2018-05-21 DIAGNOSIS — M25562 Pain in left knee: Secondary | ICD-10-CM

## 2018-05-21 DIAGNOSIS — G8929 Other chronic pain: Secondary | ICD-10-CM

## 2018-05-21 MED ORDER — BUPRENORPHINE HCL 300 MCG BU FILM: 300.0000 ug | ORAL_FILM | Freq: Two times a day (BID) | BUCCAL | 2 refills | Status: DC

## 2018-05-21 NOTE — Progress Notes (Signed)
Subjective:    Patient ID: Anita Booker, female    DOB: [birth year], 59 y.o.   MRN: [id number]  HPI 58 year old female with history of end-stage osteoarthritis bilateral knees, more recently diagnosed with end-stage osteoarthritis bilateral hips.  She has contractures of both knees and hips as well.  She is no longer working.  She is mainly in a motorized chair. She has been evaluated by orthopedics but due to her BMI of 55 she is not a good candidate for total knee arthroplasty. Her pain is managed with Belbuca 300 mcg twice daily In addition her sports medicine doctor has her on 200 mg of Celebrex daily Pain Inventory Average Pain 8 Pain Right Now 9 My pain is sharp and burning  In the last 24 hours, has pain interfered with the following? General activity 8 Relation with others 8 Enjoyment of life 8 What TIME of day is your pain at its worst? all Sleep (in general) Poor  Pain is worse with: walking, bending, sitting, inactivity, standing and some activites Pain improves with: rest, heat/ice, therapy/exercise, pacing activities, medication and injections Relief from Meds: 3  Mobility do you drive?  yes use a wheelchair transfers alone  Function disabled: date disabled na I need assistance with the following:  dressing, meal prep, household duties and shopping  Neuro/Psych numbness trouble walking  Prior Studies Any changes since last visit?  no  Physicians involved in your care Any changes since last visit?  yes Orthopedist Dr. Margaretha Sheffield   Family History  Problem Relation Age of Onset  . Lung cancer Father 55  . Cancer Father        lung  . Healthy Brother   . Uterine cancer Mother 33  . Heart attack Brother 48  . Cancer Paternal Grandmother        breast  . Breast cancer Sister 19       identical twin  . Lung cancer Paternal Uncle        all four uncles had lung cancer and were smokers  . Healthy Sister   . Breast cancer Other   . Breast cancer  Cousin        paternal cousin dx in her 33s  . Liver cancer Cousin        paternal cousin with hepatitis and liver cancer   Social History   Socioeconomic History  . Marital status: Married    Spouse name: Not on file  . Number of children: Not on file  . Years of education: Not on file  . Highest education level: Not on file  Occupational History  . Not on file  Social Needs  . Financial resource strain: Not on file  . Food insecurity:    Worry: Not on file    Inability: Not on file  . Transportation needs:    Medical: Not on file    Non-medical: Not on file  Tobacco Use  . Smoking status: Never Smoker  . Smokeless tobacco: Never Used  Substance and Sexual Activity  . Alcohol use: No  . Drug use: No  . Sexual activity: Not on file  Lifestyle  . Physical activity:    Days per week: Not on file    Minutes per session: Not on file  . Stress: Not on file  Relationships  . Social connections:    Talks on phone: Not on file    Gets together: Not on file    Attends religious service: Not on file  Active member of club or organization: Not on file    Attends meetings of clubs or organizations: Not on file    Relationship status: Not on file  Other Topics Concern  . Not on file  Social History Narrative  . Not on file   Past Surgical History:  Procedure Laterality Date  . BREAST EXCISIONAL BIOPSY Right    infection to right breast 5 years ago.  Marland Kitchen BREAST SURGERY  07/01/2011.   I & D of right breast abscess  . CHOLECYSTECTOMY  1995  . COLONOSCOPY WITH PROPOFOL N/A 07/05/2016   Procedure: COLONOSCOPY WITH PROPOFOL;  Surgeon: Willis Modena, MD;  Location: [facility];  Service: Endoscopy;  Laterality: N/A;  . LEFT HEART CATH AND CORONARY ANGIOGRAPHY N/A 03/26/2017   Procedure: LEFT HEART CATH AND CORONARY ANGIOGRAPHY;  Surgeon: Yvonne Kendall, MD;  [facility]: [facility];  Service: Cardiovascular;  Laterality: N/A;   Past Medical History:  Diagnosis Date  .  Arthritis   . Complication of anesthesia 1995   woke up on table right after gallbladder surgery  . Family history of adverse reaction to anesthesia    sister coded twice with anesthesai not sure of details sister still living  . Family history of breast cancer   . Family history of uterine cancer   . Headache    migraines  . Other and unspecified hyperlipidemia   . Unspecified essential hypertension    BP (!) 147/83   Pulse 87   Ht 5\' 1"  (1.549 m)   Wt 293 lb (132.9 kg)   SpO2 96%   BMI 55.36 kg/m   Opioid Risk Score:   Fall Risk Score:  `1  Depression screen PHQ 2/9  Depression screen [facility] 2/9 05/21/2018 04/23/2018 04/04/2016 07/15/2015 12/14/2014 08/04/2014 02/17/2014  Decreased Interest 0 0 0 1 0 0 0  Down, Depressed, Hopeless 0 0 0 0 0 0 0  PHQ - 2 Score 0 0 0 1 0 0 0  Altered sleeping - - - 1 - - -  Tired, decreased energy - - - 1 - - -  Change in appetite - - - 0 - - -  Feeling bad or failure about yourself  - - - 0 - - -  Trouble concentrating - - - 0 - - -  Moving slowly or fidgety/restless - - - 0 - - -  Suicidal thoughts - - - 0 - - -  PHQ-9 Score - - - 3 - - -    Review of Systems  Constitutional: Negative.   HENT: Negative.   Eyes: Negative.   Respiratory: Negative.   Cardiovascular: Negative.   Gastrointestinal: Negative.   Endocrine: Negative.   Genitourinary: Negative.   Musculoskeletal: Positive for gait problem.  Skin: Negative.   Allergic/Immunologic: Negative.   Hematological: Negative.   Psychiatric/Behavioral: Negative.   All other systems reviewed and are negative.      Objective:   Physical Exam Constitutional:      Appearance: She is obese.  HENT:     Head: Normocephalic and atraumatic.     Mouth/Throat:     Mouth: Mucous membranes are moist.     Pharynx: Oropharynx is clear.  Eyes:     Extraocular Movements: Extraocular movements intact.     Conjunctiva/sclera: Conjunctivae normal.     Pupils: Pupils are equal, round, and reactive  to light.  Skin:    General: Skin is warm and dry.  Neurological:     General: No focal  deficit present.     Mental Status: She is alert and oriented to person, place, and time.     Comments: Motor strength 4- in bilateral knee extensors ankle dorsiflexors are 5 hip flexors are 4- limited by range of motion.  Psychiatric:        Mood and Affect: Mood normal.        Behavior: Behavior normal.    Patient lacks 30 degrees of knee extension bilaterally.  She can flex to around 95 degrees bilaterally.  In addition her hip internal rectal range of motion is negligible.   Patient has pain with end range knee flexion extension as well as hip internal and external rotation. Knee shows no evidence of effusion. General no acute distress       Assessment & Plan:  1.  Bilateral kne end stage OA with bilateral knee contractures. We discussed that ideally she would undergo total knee arthroplasty given that any other procedures for pain would not result in improvements with her range of motion. We discussed that she is a good candidate for left knee genicular nerve radiofrequency ablation since she had greater than 50% relief on 2 occasions with genicular nerve blocks on the left side.  In addition she has successfully underwent a right genicular nerve block as well as radiofrequency procedure and her right knee is less painful than her left knee.  She will continue her Belbuca 300 mcg twice a day and follow-up in 3 months. Last urine drug screen 03/26/2018 was appropriate 2 Bilateral hip osteoarthritis.  We discussed corticosteroid injection under fluoroscopic guidance.  She had a bad experience with a knee corticosteroid injection and does not wish to pursue this.

## 2018-08-21 ENCOUNTER — Other Ambulatory Visit: Payer: Self-pay

## 2018-08-21 ENCOUNTER — Encounter: Payer: 59 | Attending: Physical Medicine & Rehabilitation | Admitting: Registered Nurse

## 2018-08-21 VITALS — BP 132/85 | HR 92 | Temp 98.2°F

## 2018-08-21 DIAGNOSIS — G894 Chronic pain syndrome: Secondary | ICD-10-CM

## 2018-08-21 DIAGNOSIS — Z79891 Long term (current) use of opiate analgesic: Secondary | ICD-10-CM

## 2018-08-21 DIAGNOSIS — M25561 Pain in right knee: Secondary | ICD-10-CM | POA: Diagnosis not present

## 2018-08-21 DIAGNOSIS — M170 Bilateral primary osteoarthritis of knee: Secondary | ICD-10-CM | POA: Insufficient documentation

## 2018-08-21 DIAGNOSIS — G8929 Other chronic pain: Secondary | ICD-10-CM

## 2018-08-21 DIAGNOSIS — M25562 Pain in left knee: Secondary | ICD-10-CM

## 2018-08-21 DIAGNOSIS — Z5181 Encounter for therapeutic drug level monitoring: Secondary | ICD-10-CM | POA: Diagnosis not present

## 2018-08-21 MED ORDER — BUPRENORPHINE HCL 300 MCG BU FILM: 300.0000 ug | ORAL_FILM | Freq: Two times a day (BID) | BUCCAL | 2 refills | Status: DC

## 2018-08-21 NOTE — Progress Notes (Addendum)
Subjective:    Patient ID: Anita Booker, female    DOB: 04/29/1960, 58 y.o.   MRN: [id number]  HPI: Anita Booker is a 58 y.o. female who returns for follow up appointment for chronic pain and medication refill. She states her pain is located in her bilateral hips and bilateral knees. She rates her  Pain 6. Her current exercise regime is using her stationary bicycle twice a day for 10 minutes.   PMP was reviewed.   Last Oral Swab was Performed on 02/04/2018, see note for details.   This provider placed a call to Ms. Booker, awaiting a return call.   Pain Inventory Average Pain 7 Pain Right Now 6 My pain is constant, sharp, burning, stabbing and aching  In the last 24 hours, has pain interfered with the following? General activity 8 Relation with others 8 Enjoyment of life 8 What TIME of day is your pain at its worst? evening Sleep (in general) Fair  Pain is worse with: walking and standing Pain improves with: rest and medication Relief from Meds: 5  Mobility how many minutes can you walk? 2 ability to climb steps?  no do you drive?  yes use a wheelchair transfers alone  Function disabled: date disabled na  Neuro/Psych weakness numbness tingling  Prior Studies Any changes since last visit?  no  Physicians involved in your care [facility]ist Dr. Margaretha Sheffieldraper   Family History  Problem Relation Age of Onset  . Lung cancer Father [id number]  . Cancer Father        lung  . Healthy Brother   . Uterine cancer Mother [id number]  . Heart attack Brother 48  . Cancer Paternal Grandmother        breast  . Breast cancer Sister [id number]       identical twin  . Lung cancer Paternal Uncle        all four uncles had lung cancer and were smokers  . Healthy Sister   . Breast cancer Other   . Breast cancer Cousin        paternal cousin dx in her 9060s  . Liver cancer Cousin        paternal cousin with hepatitis and liver cancer   Social History    Socioeconomic History  . Marital status: Married    Spouse name: Not on file  . Number of children: Not on file  . Years of education: Not on file  . Highest education level: Not on file  Occupational History  . Not on file  Social Needs  . Financial resource strain: Not on file  . Food insecurity:    Worry: Not on file    Inability: Not on file  . Transportation needs:    Medical: Not on file    Non-medical: Not on file  Tobacco Use  . Smoking status: Never Smoker  . Smokeless tobacco: Never Used  Substance and Sexual Activity  . Alcohol use: No  . Drug use: No  . Sexual activity: Not on file  Lifestyle  . Physical activity:    Days per week: Not on file    Minutes per session: Not on file  . Stress: Not on file  Relationships  . Social connections:    Talks on phone: Not on file    Gets together: Not on file    Attends religious service: Not on file    Active member of club or organization: Not on  file    Attends meetings of clubs or organizations: Not on file    Relationship status: Not on file  Other Topics Concern  . Not on file  Social History Narrative  . Not on file   Past Surgical History:  Procedure Laterality Date  . BREAST EXCISIONAL BIOPSY Right    infection to right breast 5 years ago.  Marland Kitchen BREAST SURGERY  07/01/2011.   I & D of right breast abscess  . CHOLECYSTECTOMY  1995  . COLONOSCOPY WITH PROPOFOL N/A 07/05/2016   Procedure: COLONOSCOPY WITH PROPOFOL;  Surgeon: Willis Modena, MD;  Location: [facility];  Service: Endoscopy;  Laterality: N/A;  . LEFT HEART CATH AND CORONARY ANGIOGRAPHY N/A 03/26/2017   Procedure: LEFT HEART CATH AND CORONARY ANGIOGRAPHY;  Surgeon: Yvonne Kendall, MD;  [facility];  Service: Cardiovascular;  Laterality: N/A;   Past Medical History:  Diagnosis Date  . Arthritis   . Complication of anesthesia 1995   woke up on table right after gallbladder surgery  . Family history of adverse reaction to  anesthesia    sister coded twice with anesthesai not sure of details sister still living  . Family history of breast cancer   . Family history of uterine cancer   . Headache    migraines  . Other and unspecified hyperlipidemia   . Unspecified essential hypertension    BP 132/85   Pulse 92   Temp 98.2 F (36.8 C)   SpO2 (!) 73%   Opioid Risk Score:   Fall Risk Score:  `1  Depression screen PHQ 2/9  Depression screen [facility] 2/9 08/21/2018 05/21/2018 04/23/2018 04/04/2016 07/15/2015 12/14/2014 08/04/2014  Decreased Interest 0 0 0 0 1 0 0  Down, Depressed, Hopeless 0 0 0 0 0 0 0  PHQ - 2 Score 0 0 0 0 1 0 0  Altered sleeping - - - - 1 - -  Tired, decreased energy - - - - 1 - -  Change in appetite - - - - 0 - -  Feeling bad or failure about yourself  - - - - 0 - -  Trouble concentrating - - - - 0 - -  Moving slowly or fidgety/restless - - - - 0 - -  Suicidal thoughts - - - - 0 - -  PHQ-9 Score - - - - 3 - -     Review of Systems  Constitutional: Negative.   HENT: Negative.   Eyes: Negative.   Respiratory: Negative.   Cardiovascular: Negative.   Gastrointestinal: Negative.   Endocrine: Negative.   Genitourinary: Negative.   Musculoskeletal: Negative.   Skin: Negative.   Allergic/Immunologic: Negative.   Neurological: Negative.   Hematological: Negative.   Psychiatric/Behavioral: Negative.   All other systems reviewed and are negative.      Objective:   Physical Exam Vitals signs and nursing note reviewed.  Constitutional:      Appearance: Normal appearance. She is obese.  Neck:     Musculoskeletal: Normal range of motion and neck supple.  Cardiovascular:     Rate and Rhythm: Normal rate and regular rhythm.     Pulses: Normal pulses.     Heart sounds: Normal heart sounds.  Pulmonary:     Effort: Pulmonary effort is normal.     Breath sounds: Normal breath sounds.  Musculoskeletal:     Comments: Normal Muscle Bulk and Muscle Testing Reveals:  Upper Extremities: Full  ROM and Muscle Strength 5/5 Lower Extremities: Decreased ROM  and Muscle Strength 4/5 Bilateral lOwer Extremities Flexion Produces Pain into Bilateral Patella's Arrived in scooter  Skin:    General: Skin is warm and dry.  Neurological:     Mental Status: She is alert and oriented to person, place, and time.           Assessment & Plan:  1. 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 Continue HEP as Tolerated.  Refilled: Belbuca 300 MCG Fil inside cheek every 12 hours #60.  We will continue the opioid monitoring program, this consists of regular clinic visits, examinations, urine drug screen, pill counts as well as use of [place] Controlled Substance Reporting system. 2. Bilateral Hip osteoarthritis: Continue HEP as Tolerated. Continue to Monitor.   15 minutes of face to face patient care time was spent during this visit. All questions were encouraged and answered.  F/U in 2 months

## 2018-08-22 ENCOUNTER — Encounter: Payer: Self-pay | Admitting: Registered Nurse

## 2018-08-26 ENCOUNTER — Telehealth: Payer: Self-pay | Admitting: Registered Nurse

## 2018-08-26 NOTE — Telephone Encounter (Signed)
This provider placed a call to Anita Booker, for follow up check up from last week. She denies any respiratiory distress and states she is fine. We will continue to monitor.

## 2018-11-19 ENCOUNTER — Encounter: Payer: Self-pay | Admitting: Physical Medicine & Rehabilitation

## 2018-11-19 ENCOUNTER — Other Ambulatory Visit: Payer: Self-pay

## 2018-11-19 ENCOUNTER — Encounter: Payer: 59 | Attending: Physical Medicine & Rehabilitation | Admitting: Physical Medicine & Rehabilitation

## 2018-11-19 VITALS — BP 119/73 | HR 89 | Temp 98.3°F | Ht 61.0 in | Wt 292.0 lb

## 2018-11-19 DIAGNOSIS — M170 Bilateral primary osteoarthritis of knee: Secondary | ICD-10-CM

## 2018-11-19 DIAGNOSIS — Z5181 Encounter for therapeutic drug level monitoring: Secondary | ICD-10-CM | POA: Diagnosis not present

## 2018-11-19 DIAGNOSIS — G894 Chronic pain syndrome: Secondary | ICD-10-CM | POA: Diagnosis not present

## 2018-11-19 DIAGNOSIS — Z79891 Long term (current) use of opiate analgesic: Secondary | ICD-10-CM

## 2018-11-19 MED ORDER — BELBUCA 450 MCG BU FILM: 450.0000 ug | ORAL_FILM | Freq: Two times a day (BID) | BUCCAL | 2 refills | Status: DC

## 2018-11-19 NOTE — Progress Notes (Signed)
Subjective:    Patient ID: Anita Booker, female    DOB: 1960-10-22, 58 y.o.   MRN: [id number]  HPI   Bilateral knee OA endstage   Right Genicular nerve RFA 01/25/2018 working until 3.5 weeks ago starting to worsening Since this RF or off patient's pain levels have increased in the right knee  Belbuca 366mcg lasting 6-8hrs dose is q12h  Has hip OA as well  Weight steady Bicycle twice a day to help with knee mobility  Unable to donn socks  Needs help washing feet  Pain Inventory Average Pain 9 Pain Right Now 9 My pain is constant, burning, dull, stabbing and aching  In the last 24 hours, has pain interfered with the following? General activity 9 Relation with others 9 Enjoyment of life 9 What TIME of day is your pain at its worst? evening Sleep (in general) n/a  Pain is worse with: walking, bending, standing and some activites Pain improves with: rest, pacing activities and medication Relief from Meds: n/a  Mobility use a cane how many minutes can you walk? 5 feet  Function what is your job? n/a  Neuro/Psych weakness tingling trouble walking  Prior Studies Any changes since last visit?  no  Physicians involved in your care Any changes since last visit?  no   Family History  Problem Relation Age of Onset  . Lung cancer Father 48  . Cancer Father        lung  . Healthy Brother   . Uterine cancer Mother 26  . Heart attack Brother 84  . Cancer Paternal Grandmother        breast  . Breast cancer Sister 63       identical twin  . Lung cancer Paternal Uncle        all four uncles had lung cancer and were smokers  . Healthy Sister   . Breast cancer Other   . Breast cancer Cousin        paternal cousin dx in her 66s  . Liver cancer Cousin        paternal cousin with hepatitis and liver cancer   Social History   Socioeconomic History  . Marital status: Married    Spouse name: Not on file  . Number of children: Not on file  . Years of  education: Not on file  . Highest education level: Not on file  Occupational History  . Not on file  Social Needs  . Financial resource strain: Not on file  . Food insecurity    Worry: Not on file    Inability: Not on file  . Transportation needs    Medical: Not on file    Non-medical: Not on file  Tobacco Use  . Smoking status: Never Smoker  . Smokeless tobacco: Never Used  Substance and Sexual Activity  . Alcohol use: No  . Drug use: No  . Sexual activity: Not on file  Lifestyle  . Physical activity    Days per week: Not on file    Minutes per session: Not on file  . Stress: Not on file  Relationships  . Social Herbalist on phone: Not on file    Gets together: Not on file    Attends religious service: Not on file    Active member of club or organization: Not on file    Attends meetings of clubs or organizations: Not on file    Relationship status: Not on file  Other  Topics Concern  . Not on file  Social History Narrative  . Not on file   Past Surgical History:  Procedure Laterality Date  . BREAST EXCISIONAL BIOPSY Right    infection to right breast 5 years ago.  Marland Kitchen. BREAST SURGERY  07/01/2011.   I & D of right breast abscess  . CHOLECYSTECTOMY  1995  . COLONOSCOPY WITH PROPOFOL N/A 07/05/2016   Procedure: COLONOSCOPY WITH PROPOFOL;  Surgeon: Willis ModenaWilliam Outlaw, MD;  Location: [facility];  Service: Endoscopy;  Laterality: N/A;  . LEFT HEART CATH AND CORONARY ANGIOGRAPHY N/A 03/26/2017   Procedure: LEFT HEART CATH AND CORONARY ANGIOGRAPHY;  Surgeon: Yvonne KendallEnd, Christopher, MD;  [facility]: [facility];  Service: Cardiovascular;  Laterality: N/A;   Past Medical History:  Diagnosis Date  . Arthritis   . Complication of anesthesia 1995   woke up on table right after gallbladder surgery  . Family history of adverse reaction to anesthesia    sister coded twice with anesthesai not sure of details sister still living  . Family history of breast cancer   . Family  history of uterine cancer   . Headache    migraines  . Other and unspecified hyperlipidemia   . Unspecified essential hypertension    There were no vitals taken for this visit.  Opioid Risk Score:   Fall Risk Score:  `1  Depression screen PHQ 2/9  [facility] 08/21/2018 05/21/2018 04/23/2018 04/04/2016 07/15/2015 12/14/2014 08/04/2014  Decreased Interest 0 0 0 0 1 0 0  Down, Depressed, Hopeless 0 0 0 0 0 0 0  PHQ - 2 Score 0 0 0 0 1 0 0  Altered sleeping - - - - 1 - -  Tired, decreased energy - - - - 1 - -  Change in appetite - - - - 0 - -  Feeling bad or failure about yourself  - - - - 0 - -  Trouble concentrating - - - - 0 - -  Moving slowly or fidgety/restless - - - - 0 - -  Suicidal thoughts - - - - 0 - -  PHQ-9 Score - - - - 3 - -     Review of Systems  Constitutional: Negative.   HENT: Negative.   Eyes: Negative.   Respiratory: Negative.   Cardiovascular: Negative.   Gastrointestinal: Negative.   Endocrine: Negative.   Genitourinary: Negative.   Musculoskeletal: Positive for gait problem.  Skin: Negative.   Allergic/Immunologic: Negative.   Neurological: Positive for weakness.  Hematological: Negative.   Psychiatric/Behavioral: Negative.   All other systems reviewed and are negative.      Objective:   Physical Exam Musculoskeletal:     Comments: Left knee -30deg to 75 deg flex  RIght knee -40 to 65 deg flex  RIght hip int rotation nil Ext rot ~20deg   Left hip int rot nil  Ext rotation ~10 deg    Motor strength is 4- bilateral hip flexors knee extensors and 5 bilateral ankle dorsiflexion plantarflexion Sensation normal in lower extremities. Mood and affect is flat but otherwise appropriate Upper extremity strength is 5/5 deltoid by stress of grip Upper extremity range of motion is normal       Assessment & Plan:  .  End-stage osteoarthritis of the knees.  She is not a good surgical candidate due to her weight.  She is unwilling to  consider bariatric surgery.  Her weight has remained stable. We discussed the knee osteoarthritis and pain.  She does well with genicular nerve radiofrequency unfortunately her insurance paid for the procedure on the right side but then denied preauthorization on the left side. Because of the procedure wearing off, the patient will need to go up on her Belbuca to 450 mcg every 12 hours

## 2018-11-24 LAB — DRUG TOX MONITOR 1 W/CONF, ORAL FLD
Amobarbital: NEGATIVE ng/mL (ref <10)
Amphetamines: NEGATIVE ng/mL (ref <10)
Barbiturates: POSITIVE ng/mL — AB (ref <10)
Benzodiazepines: NEGATIVE ng/mL (ref <0.50)
Buprenorphine: 11.28 ng/mL — ABNORMAL HIGH (ref <0.10)
Buprenorphine: POSITIVE ng/mL — AB (ref <0.10)
Butalbital: 24 ng/mL — ABNORMAL HIGH (ref <10)
Cocaine: NEGATIVE ng/mL (ref <5.0)
Fentanyl: NEGATIVE ng/mL (ref <0.10)
Heroin Metabolite: NEGATIVE ng/mL (ref <1.0)
MARIJUANA: NEGATIVE ng/mL (ref <2.5)
MDMA: NEGATIVE ng/mL (ref <10)
Meprobamate: NEGATIVE ng/mL (ref <2.5)
Methadone: NEGATIVE ng/mL (ref <5.0)
Naloxone: NEGATIVE ng/mL (ref <0.25)
Nicotine Metabolite: NEGATIVE ng/mL (ref <5.0)
Norbuprenorphine: NEGATIVE ng/mL (ref <0.50)
Opiates: NEGATIVE ng/mL (ref <2.5)
Pentobarbital: NEGATIVE ng/mL (ref <10)
Phencyclidine: NEGATIVE ng/mL (ref <10)
Phenobarbital: NEGATIVE ng/mL (ref <10)
Secobarbital: NEGATIVE ng/mL (ref <10)
Tapentadol: NEGATIVE ng/mL (ref <5.0)
Tramadol: NEGATIVE ng/mL (ref <5.0)
Zolpidem: NEGATIVE ng/mL (ref <5.0)

## 2018-11-24 LAB — DRUG TOX ALC METAB W/CON, ORAL FLD: Alcohol Metabolite: NEGATIVE ng/mL (ref <25)

## 2018-11-26 ENCOUNTER — Telehealth: Payer: Self-pay | Admitting: *Deleted

## 2018-11-26 NOTE — Telephone Encounter (Signed)
Called [facility] to verify if Ms Anita Booker has a prescription for butalbital. They report she does not have one in their pharmacy.

## 2018-11-29 NOTE — Telephone Encounter (Signed)
Oral swab drug screen was consistent for prescribed medications. There also was a metabolite butalbital present. I spoke with Ms Anita Booker and she reports that she has an old prescription that she has had a long time for rare migraines that she takes 1/2 tablet if needed.

## 2018-11-29 NOTE — Progress Notes (Signed)
Okay that is fine

## 2019-02-27 ENCOUNTER — Other Ambulatory Visit: Payer: Self-pay

## 2019-02-27 ENCOUNTER — Encounter: Payer: Self-pay | Admitting: Physical Medicine & Rehabilitation

## 2019-02-27 ENCOUNTER — Encounter
Payer: Managed Care, Other (non HMO) | Attending: Physical Medicine & Rehabilitation | Admitting: Physical Medicine & Rehabilitation

## 2019-02-27 VITALS — BP 125/82 | HR 88 | Temp 97.9°F

## 2019-02-27 DIAGNOSIS — M170 Bilateral primary osteoarthritis of knee: Secondary | ICD-10-CM | POA: Diagnosis present

## 2019-02-27 DIAGNOSIS — Z79891 Long term (current) use of opiate analgesic: Secondary | ICD-10-CM | POA: Insufficient documentation

## 2019-02-27 DIAGNOSIS — Z5181 Encounter for therapeutic drug level monitoring: Secondary | ICD-10-CM | POA: Diagnosis present

## 2019-02-27 DIAGNOSIS — G894 Chronic pain syndrome: Secondary | ICD-10-CM | POA: Insufficient documentation

## 2019-02-27 MED ORDER — BELBUCA 450 MCG BU FILM: 450.0000 ug | ORAL_FILM | Freq: Two times a day (BID) | BUCCAL | 2 refills | Status: DC

## 2019-02-27 NOTE — Progress Notes (Signed)
Subjective:    Patient ID: Anita Booker, female    DOB: 03/07/1961, 58 y.o.   MRN: [id number]  HPI 58 year old female with morbid obesity and end-stage osteoarthritis of both knees.  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  Her Belbuca dose has been increased at last visit from 300 mcg twice a day to 450 mcg twice a day.  This has been helpful for her. In the past she has had good results with right genicular nerve radiofrequency  neurotomy performed approximately 1 year ago.  She had greater than 50% relief with left genicular nerve blocks but her insurance denied preauthorization for left genicular radiofrequency neurotomy.   Pain Inventory Average Pain 6 Pain Right Now 5 My pain is constant, sharp, burning, dull and aching  In the last 24 hours, has pain interfered with the following? General activity 9 Relation with others 9 Enjoyment of life 9 What TIME of day is your pain at its worst? evening Sleep (in general) Fair  Pain is worse with: walking, bending and standing Pain improves with: rest and medication Relief from Meds: 5  Mobility use a cane use a walker how many minutes can you walk? 1 ability to climb steps?  no do you drive?  yes use a wheelchair transfers alone  Function Do you have any goals in this area?  yes  Neuro/Psych weakness trouble walking  Prior Studies Any changes since last visit?  no  Physicians involved in your care Primary care [facility] [facility]   Family History  Problem Relation Age of Onset  . Lung cancer Father 55  . Cancer Father        lung  . Healthy Brother   . Uterine cancer Mother 32  . Heart attack Brother 78  . Cancer Paternal Grandmother        breast  . Breast cancer Sister 42       identical twin  . Lung cancer Paternal Uncle        all four uncles had lung cancer and were smokers  . Healthy Sister   . Breast cancer Other   . Breast cancer Cousin      paternal cousin dx in her 73s  . Liver cancer Cousin        paternal cousin with hepatitis and liver cancer   Social History   Socioeconomic History  . Marital status: Married    Spouse name: Not on file  . Number of children: Not on file  . Years of education: Not on file  . Highest education level: Not on file  Occupational History  . Not on file  Tobacco Use  . Smoking status: Never Smoker  . Smokeless tobacco: Never Used  Substance and Sexual Activity  . Alcohol use: No  . Drug use: No  . Sexual activity: Not on file  Other Topics Concern  . Not on file  Social History Narrative  . Not on file   Social Determinants of Health   Financial Resource Strain:   . Difficulty of Paying Living Expenses: Not on file  Food Insecurity:   . Worried About Charity fundraiser in the Last Year: Not on file  . Ran Out of Food in the Last Year: Not on file  Transportation Needs:   . Lack of Transportation (Medical): Not on file  . Lack of Transportation (Non-Medical): Not on file  Physical Activity:   .  Days of Exercise per Week: Not on file  . Minutes of Exercise per Session: Not on file  Stress:   . Feeling of Stress : Not on file  Social Connections:   . Frequency of Communication with Friends and Family: Not on file  . Frequency of Social Gatherings with Friends and Family: Not on file  . Attends Religious Services: Not on file  . Active Member of Clubs or Organizations: Not on file  . Attends Banker Meetings: Not on file  . Marital Status: Not on file   Past Surgical History:  Procedure Laterality Date  . BREAST EXCISIONAL BIOPSY Right    infection to right breast 5 years ago.  Marland Kitchen BREAST SURGERY  07/01/2011.   I & D of right breast abscess  . CHOLECYSTECTOMY  1995  . COLONOSCOPY WITH PROPOFOL N/A 07/05/2016   Procedure: COLONOSCOPY WITH PROPOFOL;  Surgeon: Willis Modena, MD;  Location: [facility];  Service: Endoscopy;  Laterality: N/A;  . LEFT HEART  CATH AND CORONARY ANGIOGRAPHY N/A 03/26/2017   Procedure: LEFT HEART CATH AND CORONARY ANGIOGRAPHY;  Surgeon: Yvonne Kendall, MD;  Location: [facility];  Service: Cardiovascular;  Laterality: N/A;   Past Medical History:  Diagnosis Date  . Arthritis   . Complication of anesthesia 1995   woke up on table right after gallbladder surgery  . Family history of adverse reaction to anesthesia    sister coded twice with anesthesai not sure of details sister still living  . Family history of breast cancer   . Family history of uterine cancer   . Headache    migraines  . Other and unspecified hyperlipidemia   . Unspecified essential hypertension    BP 125/82   Pulse 88   Temp 97.9 F (36.6 C)   SpO2 94%   Opioid Risk Score:   Fall Risk Score:  `1  Depression screen PHQ 2/9  Depression screen [facility] 2/9 08/21/2018 05/21/2018 04/23/2018 04/04/2016 07/15/2015 12/14/2014 08/04/2014  Decreased Interest 0 0 0 0 1 0 0  Down, Depressed, Hopeless 0 0 0 0 0 0 0  PHQ - 2 Score 0 0 0 0 1 0 0  Altered sleeping - - - - 1 - -  Tired, decreased energy - - - - 1 - -  Change in appetite - - - - 0 - -  Feeling bad or failure about yourself  - - - - 0 - -  Trouble concentrating - - - - 0 - -  Moving slowly or fidgety/restless - - - - 0 - -  Suicidal thoughts - - - - 0 - -  PHQ-9 Score - - - - 3 - -    Review of Systems  Musculoskeletal: Positive for gait problem.  Neurological: Positive for weakness.  All other systems reviewed and are negative.      Objective:   Physical Exam Constitutional:      Appearance: She is obese.  Eyes:     Extraocular Movements: Extraocular movements intact.     Conjunctiva/sclera: Conjunctivae normal.     Pupils: Pupils are equal, round, and reactive to light.  Musculoskeletal:     Right knee: No swelling or deformity. Decreased range of motion. Tenderness present over the medial joint line and patellar tendon.     Left knee: No swelling or deformity. Decreased  range of motion. Tenderness present over the medial joint line and patellar tendon.     Right lower leg: No edema.  Left lower leg: No edema.  Skin:    General: Skin is warm and dry.  Neurological:     Mental Status: She is alert and oriented to person, place, and time.     Sensory: Sensation is intact.     Gait: Gait abnormal.     Comments: Motor strength is 4 - bilateral hip flexors knee extensors and ankle dorsiflexors bilaterally  Psychiatric:        Mood and Affect: Mood normal.        Behavior: Behavior normal.     Left Knee ROM Ext - 35, Flex 80deg Right Knee ext -50, Flex to 85 deg   Left knee tenderness medial joint line and patellar tendon No effusion   Right knee tenderness medial joint line and patellar tendon No effusion       Assessment & Plan:  #1.  End-stage osteoarthritis both knees she is not a surgical candidate due to her morbid obesity.  She has had good relief with left genicular nerve block under fluoroscopic guidance on 2 occasions and would be a good candidate for left genicular nerve radiofrequency neurotomy.  She did have good relief with right genicular nerve radiofrequency neurotomy.  This would need to be repeated since it has been over a year and she has had recurrence of symptoms. We will continue her Belbuca 450 mcg twice a day.  If she underwent radiofrequency neurotomy she may be able to reduce her dose or get back on tramadol. Because of increasing immobility she has developed increasing knee contractures.  I am referring her to physical therapy however this will be difficult to get back to a normal range of motion given her degree of contracture. Also made a referral to Occupational Therapy to help with lower body dressing and bathing and equipment management.  I will see the patient back in 3 months.

## 2019-02-27 NOTE — Patient Instructions (Signed)
Please call if you do not hear from physical therapy or occupational therapy next week

## 2019-03-03 ENCOUNTER — Ambulatory Visit: Payer: 59 | Admitting: Psychology

## 2019-03-06 ENCOUNTER — Other Ambulatory Visit: Payer: Self-pay | Admitting: Surgery

## 2019-03-06 LAB — DRUG TOX MONITOR 1 W/CONF, ORAL FLD
Amobarbital: NEGATIVE ng/mL (ref <10)
Amphetamines: NEGATIVE ng/mL (ref <10)
Barbiturates: POSITIVE ng/mL — AB (ref <10)
Benzodiazepines: NEGATIVE ng/mL (ref <0.50)
Buprenorphine: >25 ng/mL — ABNORMAL HIGH (ref <0.10)
Buprenorphine: POSITIVE ng/mL — AB (ref <0.10)
Butalbital: 10 ng/mL — ABNORMAL HIGH (ref <10)
Cocaine: NEGATIVE ng/mL (ref <5.0)
Fentanyl: NEGATIVE ng/mL (ref <0.10)
Heroin Metabolite: NEGATIVE ng/mL (ref <1.0)
MARIJUANA: NEGATIVE ng/mL (ref <2.5)
MDMA: NEGATIVE ng/mL (ref <10)
Meprobamate: NEGATIVE ng/mL (ref <2.5)
Methadone: NEGATIVE ng/mL (ref <5.0)
Naloxone: NEGATIVE ng/mL (ref <0.25)
Nicotine Metabolite: NEGATIVE ng/mL (ref <5.0)
Norbuprenorphine: 20.07 ng/mL — ABNORMAL HIGH (ref <0.50)
Opiates: NEGATIVE ng/mL (ref <2.5)
Pentobarbital: NEGATIVE ng/mL (ref <10)
Phencyclidine: NEGATIVE ng/mL (ref <10)
Phenobarbital: NEGATIVE ng/mL (ref <10)
Secobarbital: NEGATIVE ng/mL (ref <10)
Tapentadol: NEGATIVE ng/mL (ref <5.0)
Tramadol: NEGATIVE ng/mL (ref <5.0)
Zolpidem: NEGATIVE ng/mL (ref <5.0)

## 2019-03-06 LAB — DRUG TOX ALC METAB W/CON, ORAL FLD: Alcohol Metabolite: NEGATIVE ng/mL (ref <25)

## 2019-03-12 ENCOUNTER — Encounter: Payer: Self-pay | Admitting: Physical Therapy

## 2019-03-12 ENCOUNTER — Ambulatory Visit: Payer: Managed Care, Other (non HMO) | Attending: Physical Medicine & Rehabilitation | Admitting: Occupational Therapy

## 2019-03-12 ENCOUNTER — Ambulatory Visit: Payer: Managed Care, Other (non HMO) | Admitting: Physical Therapy

## 2019-03-12 ENCOUNTER — Other Ambulatory Visit: Payer: Self-pay

## 2019-03-12 DIAGNOSIS — R2681 Unsteadiness on feet: Secondary | ICD-10-CM

## 2019-03-12 DIAGNOSIS — M25661 Stiffness of right knee, not elsewhere classified: Secondary | ICD-10-CM

## 2019-03-12 DIAGNOSIS — R2689 Other abnormalities of gait and mobility: Secondary | ICD-10-CM | POA: Diagnosis present

## 2019-03-12 DIAGNOSIS — M25662 Stiffness of left knee, not elsewhere classified: Secondary | ICD-10-CM | POA: Diagnosis present

## 2019-03-12 NOTE — Therapy (Signed)
[facility] [address] [place], [place], [address] Phone: [phone]   Fax:  [phone]  Occupational Therapy Treatment  Patient Details  Name: Anita EmmsWanda Elaine Booker MRN: [id number] Date of Birth: 08/28/1960 Referring Provider (OT): Dr. Wynn BankerKirsteins   Encounter Date: 03/12/2019  OT End of Session - 03/12/19 1329    Visit Number  1    Number of Visits  1    Authorization Type  [employer]    OT Start Time  1325    OT Stop Time  1400    OT Time Calculation (min)  35 min    Activity Tolerance  Patient tolerated treatment well    Behavior During Therapy  [facility] for tasks assessed/performed       Past Medical History:  Diagnosis Date  . Arthritis   . Complication of anesthesia 1995   woke up on table right after gallbladder surgery  . Family history of adverse reaction to anesthesia    sister coded twice with anesthesai not sure of details sister still living  . Family history of breast cancer   . Family history of uterine cancer   . Headache    migraines  . Other and unspecified hyperlipidemia   . Unspecified essential hypertension     Past Surgical History:  Procedure Laterality Date  . BREAST EXCISIONAL BIOPSY Right    infection to right breast 5 years ago.  Marland Kitchen. BREAST SURGERY  07/01/2011.   I & D of right breast abscess  . CHOLECYSTECTOMY  1995  . COLONOSCOPY WITH PROPOFOL N/A 07/05/2016   Procedure: COLONOSCOPY WITH PROPOFOL;  Surgeon: Willis ModenaWilliam Outlaw, MD;  Location: [facility];  Service: Endoscopy;  Laterality: N/A;  . LEFT HEART CATH AND CORONARY ANGIOGRAPHY N/A 03/26/2017   Procedure: LEFT HEART CATH AND CORONARY ANGIOGRAPHY;  Surgeon: Yvonne KendallEnd, Christopher, MD;  Location: [facility];  Service: Cardiovascular;  Laterality: N/A;    There were no vitals filed for this visit.  Subjective Assessment - 03/12/19 1328    Pertinent History  OA in bilateral hips and knees    Patient Stated Goals  to be as I as  possible    Currently in Pain?  Yes    Pain Score  6     Pain Location  Knee   hips   Pain Orientation  Right;Left    Pain Descriptors / Indicators  Aching    Pain Type  Chronic pain    Pain Onset  More than a month ago    Pain Frequency  Constant    Aggravating Factors   mobility    Pain Relieving Factors  rest         [facility] OT Assessment - 03/12/19 1425      Assessment   Medical Diagnosis  OA bilateral hips and knees    Referring Provider (OT)  Dr. Wynn BankerKirsteins    Onset Date/Surgical Date  02/27/19      Precautions   Precautions  Fall      Balance Screen   Has the patient fallen in the past 6 months  No    Has the patient had a decrease in activity level because of a fear of falling?   No    Is the patient reluctant to leave their home because of a fear of falling?   No      Home  Environment   Family/patient expects to be discharged to:  Private residence    Home Access  Stairs  Alternate Level Stairs - Number of Steps  4    Bathroom Shower/Tub  Walk-in Shower   has shower chair   Additional Comments  stair lift     Lives With  Spouse   2 grown childrens     Prior Function   Level of Independence  Independent    Vocation  --   RN, currently on LOA   Leisure  watches movies      ADL   Eating/Feeding  Modified independent    Upper Body Bathing  Modified independent    Lower Body Bathing  Minimal assistance    Upper Body Dressing  Independent    Lower Body Dressing  Moderate assistance   needs assist with shoes and socks   Toilet Transfer  Modified independent;Other (comment)   uses cane or walker   Tub/Shower Transfer  Modified independent      IADL   Shopping  Needs to be accompanied on any shopping trip    Light Housekeeping  Performs light daily tasks such as dishwashing, bed making   motorized chair   Meal Prep  Able to complete simple warm meal prep      Mobility   Mobility Status  --   uses walker or quad cane     Sensation   Light Touch   Impaired by gross assessment   fingers numbness at times      Coordination   Gross Motor Movements are Fluid and Coordinated  Yes      AROM   Overall AROM   Within functional limits for tasks performed      Strength   Overall Strength  Within functional limits for tasks performed                       OT Education - 03/12/19 1417    Education Details  Pt was instructed in use of reacher, sock aide, dressing stick and pt was shown long handled sponge for bathing- pt was provided with info for purchase online.    Person(s) Educated  Patient    Methods  Explanation;Demonstration;Handout    Comprehension  Verbalized understanding;Returned demonstration          OT Long Term Goals - 03/12/19 1423      OT LONG TERM GOAL #1   Title  No goals set, 1x eval and treatment            Plan - 03/12/19 1418    Clinical Impression Statement  Pt is a 58 y.o female with bilateral LE OA which impedes perfrormance of ADLs and functional mobility due to pain, stiffness and ROM limitations.. Pt was seen for an evaluation and 1x visit for education regarding AE to increase pt safety and independence with ADLs/ IADLs.    OT Occupational Profile and History  Problem Focused Assessment - Including review of records relating to presenting problem    Occupational performance deficits (Please refer to evaluation for details):  ADL's;IADL's;Work;Play;Leisure;Social Participation    Body Structure / Function / Physical Skills  ADL;Balance;Flexibility;Pain;Gait;Strength;Mobility;IADL    Rehab Potential  Good    Clinical Decision Making  Limited treatment options, no task modification necessary    Comorbidities Affecting Occupational Performance:  May have comorbidities impacting occupational performance    Modification or Assistance to Complete Evaluation   No modification of tasks or assist necessary to complete eval    OT Frequency  One time visit    OT Duration  12 weeks  OT  Treatment/Interventions  Self-care/ADL training;DME and/or AE instruction;Patient/family education    Plan  d/c OT, pt was seen for a 1x visit for education regaring AE. Pt verbalizes understanding of all education.       Patient will benefit from skilled therapeutic intervention in order to improve the following deficits and impairments:   Body Structure / Function / Physical Skills: ADL, Balance, Flexibility, Pain, Gait, Strength, Mobility, IADL       Visit Diagnosis: Unsteadiness on feet  Stiffness of left knee, not elsewhere classified  Stiffness of right knee, not elsewhere classified  Other abnormalities of gait and mobility    Problem List Patient Active Problem List   Diagnosis Date Noted  . Abnormal nuclear cardiac imaging test 03/23/2017  . Essential hypertension 02/13/2017  . Mixed dyslipidemia 02/13/2017  . Chest pain 02/13/2017  . Morbid obesity ([place]) 02/13/2017  . Chronic pain syndrome 09/28/2016  . Genetic testing 09/07/2016  . Family history of breast cancer   . Family history of uterine cancer   . Bilateral primary osteoarthritis of knee 06/05/2016  . Corneal ulcer 08/06/2012  . Cellulitis and abscess of trunk 04/30/2012  . Chronic pain of left knee 10/31/2011    Carliss Quast 03/12/2019, 2:25 PM Theone Murdoch, OTR/L [phone] Phone: [phone] 2:25 PM 03/12/19 [place] [address] [place], [place], [address] Phone: [phone]   Fax:  [phone]  Name: Zakiyyah Savannah MRN: [id number] Date of Birth: 11/01/1960

## 2019-03-12 NOTE — Patient Instructions (Signed)
Access Code: [email or web address]  URL: [email or web address]  Date: 03/12/2019  Prepared by: Mady Haagensen   Exercises Seated Hamstring stretch - 3 reps - 1 sets - 30 sec hold - 1-2x daily - 7x weekly Long Sitting Quad Set - 5-10 reps - 1-2 sets - 3 sec hold - 1-2x daily - 7x weekly Seated Heel Slide - 10 reps - 1-2 sets - 5 hold - 1-2x daily - 7x weekly Seated Long Arc Quad - 5-10 reps - 1-2 sets - 1-2x daily - 7x weekly Bent Knee Fallouts - 10 reps - 1-2 sets - 1x daily - 7x weekly Seated Hip Abduction - 10 reps - 1-2 sets - 1x daily - 7x weekly Seated Gluteal Sets - 10 reps - 1-2 sets - 1-2x daily - 7x weekly

## 2019-03-12 NOTE — Therapy (Signed)
[facility] [address] [place], [place], [address] Phone: [phone]   Fax:  [phone]  Physical Therapy Evaluation  Patient Details  Name: Anita Booker MRN: [id number] Date of Birth: April 09, 1960 Referring Provider (PT): Alysia Penna   Encounter Date: 03/12/2019  PT End of Session - 03/12/19 1846    Visit Number  1    Number of Visits  1    Authorization Type  [employer] Managed    PT Start Time  3976    PT Stop Time  1622    PT Time Calculation (min)  51 min    Activity Tolerance  Patient tolerated treatment well;Patient limited by pain    Behavior During Therapy  [facility] for tasks assessed/performed       Past Medical History:  Diagnosis Date  . Arthritis   . Complication of anesthesia 1995   woke up on table right after gallbladder surgery  . Family history of adverse reaction to anesthesia    sister coded twice with anesthesai not sure of details sister still living  . Family history of breast cancer   . Family history of uterine cancer   . Headache    migraines  . Other and unspecified hyperlipidemia   . Unspecified essential hypertension     Past Surgical History:  Procedure Laterality Date  . BREAST EXCISIONAL BIOPSY Right    infection to right breast 5 years ago.  Marland Kitchen BREAST SURGERY  07/01/2011.   I & D of right breast abscess  . CHOLECYSTECTOMY  1995  . COLONOSCOPY WITH PROPOFOL N/A 07/05/2016   Procedure: COLONOSCOPY WITH PROPOFOL;  Surgeon: Arta Silence, MD;  Location: [facility];  Service: Endoscopy;  Laterality: N/A;  . LEFT HEART CATH AND CORONARY ANGIOGRAPHY N/A 03/26/2017   Procedure: LEFT HEART CATH AND CORONARY ANGIOGRAPHY;  Surgeon: Nelva Bush, MD;  Location: [facility];  Service: Cardiovascular;  Laterality: N/A;    There were no vitals filed for this visit.   Subjective Assessment - 03/12/19 1535    Subjective  Trying to get some exercises that I can do at  home; neither of my legs completely straighten out; R is worse, but L has more difficulty straightening.  I am in a chair most of the time.  Use motorized scooter for outdoors and w/c (motorized at home).  This has been the last two years.  No falls.    How long can you walk comfortably?  Walks to bathroom (10 ft) with cane or walker.    Patient Stated Goals  Pt's goal is to get exercises she can do at home in sitting to prevent further contractures.    Currently in Pain?  Yes    Pain Score  6    Best:  3/10; Worst:  10/10   Pain Location  Knee    Pain Orientation  Right;Left    Pain Descriptors / Indicators  Burning;Aching    Pain Type  Chronic pain    Pain Onset  More than a month ago    Pain Frequency  Constant    Aggravating Factors   mobility and standing    Pain Relieving Factors  sitting         [facility] PT Assessment - 03/12/19 1540      Assessment   Medical Diagnosis  OA bilateral hips and knees    Referring Provider (PT)  Alysia Penna    Onset Date/Surgical Date  02/27/19   worsening over past  13 years; past 2 years in w/c     Precautions   Precautions  Fall      Balance Screen   Has the patient fallen in the past 6 months  No    Has the patient had a decrease in activity level because of a fear of falling?   No    Is the patient reluctant to leave their home because of a fear of falling?   No      Home Environment   Living Environment  Private residence    Living Arrangements  Alone    Available Help at Discharge  Family    Type of Home  House    Home Access  Stairs to enter    Entrance Stairs-Number of Steps  4    Entrance Stairs-Rails  Right;Left    Home Layout  Two level;Bed/bath upstairs    Home Equipment  --   Stair lift for stairs up to second floor     Prior Function   Level of Independence  Independent    Vocation  --   Nurse, currently on LOA   Leisure  watches movies      ROM / Strength   AROM / PROM / Strength  AROM;Strength      AROM    Overall AROM   Deficits   Pt unable to get into supine position on mat   Overall AROM Comments  Seated AROM knee extension -35 degrees RLE; -32 degrees; R knee flexion 76 degrees; 78 degrees L knee flexion      Strength   Overall Strength  Deficits    Overall Strength Comments  Grossly tested bilateral hip flexion 3/5, bilateral quads and hamstrings 3-/5; bilateral ankle dorsiflexion 4/5      Transfers   Transfers  Sit to Stand;Stand to Sit    Sit to Stand  6: Modified independent (Device/Increase time);With upper extremity assist;From chair/3-in-1    Stand to Sit  6: Modified independent (Device/Increase time);With upper extremity assist;To bed    Comments  Pt maintains crouched pattern in standing with transfers.  Pt requests PT to adjust cane, with pt lowering cane 2 notches, for improved positioning for quad cane during transfers.         Therapeutic Exercise Pt performs and was instructed in the following for HEP:    Access Code: [email or web address]  URL: [email or web address]  Date: 03/12/2019  Prepared by: Lonia BloodAmy Terriah Reggio   Exercises Seated Hamstring stretch - 3 reps - 1 sets - 30 sec hold - 1-2x daily - 7x weekly (foot propped on 4" block, performed 2 reps x 30 seconds) Long Sitting Quad Set - 5-10 reps - 1-2 sets - 3 sec hold - 1-2x daily - 7x weekly (modified-cues to perform seated with feet propped up in her recliner) Seated Heel Slide - 10 reps - 1-2 sets - 5 hold - 1-2x daily - 7x weekly (at edge of mat, using pillow case and used pool noodle for improved ease of movement) Seated Long Arc Quad - 5-10 reps - 1-2 sets - 1-2x daily - 7x weekly (performed x 3 reps each leg) Bent Knee Fallouts - 10 reps - 1-2 sets - 1x daily - 7x weekly (demonstrated by PT-cues to perform with knees bent in recliner) Seated Hip Abduction - 10 reps - 1-2 sets - 1x daily - 7x weekly (pt performs as stepping out and in at edge of mat) Seated Gluteal Sets - 10 reps - 1-2 sets - 1-2x daily -  7x  weekly (seated heel digs x 5 reps)     Objective measurements completed on examination: See above findings.              PT Education - 03/12/19 1843    Education Details  Provided patient with HEP (pt feels she can conitnue this at home and does not need further PT visits)    Person(s) Educated  Patient    Methods  Explanation;Demonstration;Handout    Comprehension  Verbalized understanding;Returned demonstration          PT Long Term Goals - 03/12/19 1852      PT LONG TERM GOAL #1   Title  Pt will demo/verbalize understanding of HEP to address lower extremity flexibility and strength.    Time  1    Period  Days    Status  Achieved             Plan - 03/12/19 1847    Clinical Impression Statement  Pt presents to OPP[facility] with history of bilateral OA of knees, with pt reporting OA of hips as well.  She presents with decreased flexibility, decreased knee range of motion, decreased lower extremity strength, decreased ability for standing tolerance and gait due to increased pain in knees with weightbearing.  Pt desires to have HEP with exercises for knee/hip strengthening and flexibility.  Issued HEP and pt verbalizes/demo understanding.  Pt does not feel that she wants further therapy; feels she can continue her exercises at home.  Educated patient in benefits of use of her stationary bike and gentle ROM/flexibility exercises to help with ease of mobility and decreased pain.    Personal Factors and Comorbidities  Comorbidity 1    Comorbidities  OA bilateral knees    Examination-Activity Limitations  Bed Mobility;Locomotion Level;Transfers;Stand    Examination-Participation Restrictions  Meal Prep    Stability/Clinical Decision Making  Stable/Uncomplicated    Clinical Decision Making  Low    Rehab Potential  Good    PT Frequency  One time visit    PT Treatment/Interventions  Therapeutic exercise    PT Next Visit Plan  NA-pt is discharged at eval visit.    Consulted  and Agree with Plan of Care  Patient       Patient will benefit from skilled therapeutic intervention in order to improve the following deficits and impairments:  Decreased range of motion, Decreased activity tolerance, Impaired flexibility, Decreased strength, Decreased mobility  Visit Diagnosis: Stiffness of left knee, not elsewhere classified  Stiffness of right knee, not elsewhere classified  Unsteadiness on feet     Problem List Patient Active Problem List   Diagnosis Date Noted  . Abnormal nuclear cardiac imaging test 03/23/2017  . Essential hypertension 02/13/2017  . Mixed dyslipidemia 02/13/2017  . Chest pain 02/13/2017  . Morbid obesity (HCC) 02/13/2017  . Chronic pain syndrome 09/28/2016  . Genetic testing 09/07/2016  . Family history of breast cancer   . Family history of uterine cancer   . Bilateral primary osteoarthritis of knee 06/05/2016  . Corneal ulcer 08/06/2012  . Cellulitis and abscess of trunk 04/30/2012  . Chronic pain of left knee 10/31/2011    Markela Wee W. 03/12/2019, 6:54 PM  Gean Maidens., PT  [facility] [address] [place], [place], [address] Phone: [phone]   Fax:  [phone]  Name: Kellen Hover MRN: [id number] Date of Birth: March 20, 1961

## 2019-03-19 ENCOUNTER — Telehealth: Payer: Self-pay | Admitting: *Deleted

## 2019-03-19 NOTE — Telephone Encounter (Signed)
Oral swab drug screen was consistent for prescribed medications. She has the butalbital present again which she reported she has an old prescription and she takes occasionally for migraine.

## 2019-04-24 ENCOUNTER — Other Ambulatory Visit: Payer: Self-pay | Admitting: Physical Medicine & Rehabilitation

## 2019-05-28 ENCOUNTER — Encounter: Payer: 59 | Attending: Physical Medicine & Rehabilitation | Admitting: Registered Nurse

## 2019-05-28 ENCOUNTER — Other Ambulatory Visit: Payer: Self-pay

## 2019-05-28 ENCOUNTER — Encounter: Payer: Self-pay | Admitting: Registered Nurse

## 2019-05-28 VITALS — BP 136/82 | HR 75 | Temp 97.7°F | Ht 61.0 in | Wt 288.0 lb

## 2019-05-28 DIAGNOSIS — G894 Chronic pain syndrome: Secondary | ICD-10-CM | POA: Diagnosis not present

## 2019-05-28 DIAGNOSIS — M7061 Trochanteric bursitis, right hip: Secondary | ICD-10-CM

## 2019-05-28 DIAGNOSIS — Z5181 Encounter for therapeutic drug level monitoring: Secondary | ICD-10-CM

## 2019-05-28 DIAGNOSIS — M7062 Trochanteric bursitis, left hip: Secondary | ICD-10-CM

## 2019-05-28 DIAGNOSIS — Z79891 Long term (current) use of opiate analgesic: Secondary | ICD-10-CM | POA: Diagnosis present

## 2019-05-28 DIAGNOSIS — M170 Bilateral primary osteoarthritis of knee: Secondary | ICD-10-CM

## 2019-05-28 MED ORDER — BELBUCA 450 MCG BU FILM: 450.0000 ug | ORAL_FILM | Freq: Two times a day (BID) | BUCCAL | 2 refills | Status: DC

## 2019-05-28 NOTE — Progress Notes (Signed)
Subjective:    Patient ID: Anita Booker, female    DOB: 1960/09/18, 59 y.o.   MRN: [id number]  HPI: Anita Booker is a 59 y.o. female who returns for follow up appointment for chronic pain and medication refill. She states her pain is located in her bilateral knees and bilateral hips. She rates her pain 8. Her current exercise regime is walking short distances in her home with walker or cane  and performing stretching exercises.    Last Oral Swab was Performed on 02/27/2019, it was consistent, see note for more details.    Pain Inventory Average Pain 6 Pain Right Now 8 My pain is sharp, burning, dull, stabbing, tingling and aching  In the last 24 hours, has pain interfered with the following? General activity na Relation with others na Enjoyment of life na What TIME of day is your pain at its worst? evening Sleep (in general) Fair  Pain is worse with: walking and standing Pain improves with: rest, heat/ice, therapy/exercise and medication Relief from Meds: 4  Mobility walk with assistance use a cane use a walker how many minutes can you walk? 2 ability to climb steps?  no do you drive?  yes use a wheelchair  Function not employed: date last employed .  Neuro/Psych weakness trouble walking  Prior Studies Any changes since last visit?  no  Physicians involved in your care Any changes since last visit?  no   Family History  Problem Relation Age of Onset  . Lung cancer Father 106  . Cancer Father        lung  . Healthy Brother   . Uterine cancer Mother 9  . Heart attack Brother 48  . Cancer Paternal Grandmother        breast  . Breast cancer Sister 2       identical twin  . Lung cancer Paternal Uncle        all four uncles had lung cancer and were smokers  . Healthy Sister   . Breast cancer Other   . Breast cancer Cousin        paternal cousin dx in her 13s  . Liver cancer Cousin        paternal cousin with hepatitis and liver cancer    Social History   Socioeconomic History  . Marital status: Married    Spouse name: Not on file  . Number of children: Not on file  . Years of education: Not on file  . Highest education level: Not on file  Occupational History  . Not on file  Tobacco Use  . Smoking status: Never Smoker  . Smokeless tobacco: Never Used  Substance and Sexual Activity  . Alcohol use: No  . Drug use: No  . Sexual activity: Not on file  Other Topics Concern  . Not on file  Social History Narrative  . Not on file   Social Determinants of Health   Financial Resource Strain:   . Difficulty of Paying Living Expenses: Not on file  Food Insecurity:   . Worried About Programme researcher, broadcasting/film/video in the Last Year: Not on file  . Ran Out of Food in the Last Year: Not on file  Transportation Needs:   . Lack of Transportation (Medical): Not on file  . Lack of Transportation (Non-Medical): Not on file  Physical Activity:   . Days of Exercise per Week: Not on file  . Minutes of Exercise per Session: Not on file  Stress:   . Feeling of Stress : Not on file  Social Connections:   . Frequency of Communication with Friends and Family: Not on file  . Frequency of Social Gatherings with Friends and Family: Not on file  . Attends Religious Services: Not on file  . Active Member of Clubs or Organizations: Not on file  . Attends Banker Meetings: Not on file  . Marital Status: Not on file   Past Surgical History:  Procedure Laterality Date  . BREAST EXCISIONAL BIOPSY Right    infection to right breast 5 years ago.  Marland Kitchen BREAST SURGERY  07/01/2011.   I & D of right breast abscess  . CHOLECYSTECTOMY  1995  . COLONOSCOPY WITH PROPOFOL N/A 07/05/2016   Procedure: COLONOSCOPY WITH PROPOFOL;  Surgeon: Willis Modena, MD;  [facility]: [facility];  Service: Endoscopy;  Laterality: N/A;  . LEFT HEART CATH AND CORONARY ANGIOGRAPHY N/A 03/26/2017   Procedure: LEFT HEART CATH AND CORONARY ANGIOGRAPHY;  Surgeon: Yvonne Kendall, MD;  [facility];  Service: Cardiovascular;  Laterality: N/A;   Past Medical History:  Diagnosis Date  . Arthritis   . Complication of anesthesia 1995   woke up on table right after gallbladder surgery  . Family history of adverse reaction to anesthesia    sister coded twice with anesthesai not sure of details sister still living  . Family history of breast cancer   . Family history of uterine cancer   . Headache    migraines  . Other and unspecified hyperlipidemia   . Unspecified essential hypertension    There were no vitals taken for this visit.  Opioid Risk Score:   Fall Risk Score:  `1  Depression screen PHQ 2/9  Depression screen [facility] 2/9 08/21/2018 05/21/2018 04/23/2018 04/04/2016 07/15/2015 12/14/2014 08/04/2014  Decreased Interest 0 0 0 0 1 0 0  Down, Depressed, Hopeless 0 0 0 0 0 0 0  PHQ - 2 Score 0 0 0 0 1 0 0  Altered sleeping - - - - 1 - -  Tired, decreased energy - - - - 1 - -  Change in appetite - - - - 0 - -  Feeling bad or failure about yourself  - - - - 0 - -  Trouble concentrating - - - - 0 - -  Moving slowly or fidgety/restless - - - - 0 - -  Suicidal thoughts - - - - 0 - -  PHQ-9 Score - - - - 3 - -    Review of Systems  Constitutional: Negative.   HENT: Negative.   Eyes: Negative.   Respiratory: Negative.   Cardiovascular: Negative.   Gastrointestinal: Negative.   Endocrine: Negative.   Genitourinary: Negative.   Musculoskeletal: Positive for arthralgias and gait problem.  Allergic/Immunologic: Negative.   Neurological: Positive for weakness.  Hematological: Negative.   Psychiatric/Behavioral: Negative.   All other systems reviewed and are negative.      Objective:   Physical Exam Vitals and nursing note reviewed.  Constitutional:      Appearance: Normal appearance.  Cardiovascular:     Rate and Rhythm: Normal rate and regular rhythm.     Pulses: Normal pulses.     Heart sounds: Normal heart sounds.  Pulmonary:       Effort: Pulmonary effort is normal.     Breath sounds: Normal breath sounds.  Musculoskeletal:     Cervical back: Normal range of motion and neck supple.  Comments: Normal Muscle Bulk and Muscle Testing Reveals:  Upper Extremities: Full ROM and Muscle Strength 5/5 Bilateral Greater Trochanter Tenderness Lower Extremities: Decreased ROM and Muscle Strength 5/5 Bilateral Lower extremities Flexion Produces Pain into her Bilateral Pattela's Arrived in scooter  Skin:    General: Skin is warm and dry.  Neurological:     Mental Status: She is alert and oriented to person, place, and time.  Psychiatric:        Mood and Affect: Mood normal.        Behavior: Behavior normal.           Assessment & Plan:  1. 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 Continue HEP as Tolerated. 05/28/2019 Refilled: Belbuca 300 MCG Fil inside cheek every 12 hours #60.  We will continue the opioid monitoring program, this consists of regular clinic visits, examinations, urine drug screen, pill counts as well as use of [place] Controlled Substance Reporting system. 2. Bilateral Hip osteoarthritis: Continue HEP as Tolerated. Continue to Monitor. 05/28/2019 3. Bilateral Greater Trochanteric Tenderness: Continue to Alternate Ice and Heat Therapy. Continue to Monitor.   15 minutes of face to face patient care time was spent during this visit. All questions were encouraged and answered.  F/U in 3 months

## 2019-06-26 ENCOUNTER — Other Ambulatory Visit: Payer: Self-pay | Admitting: Obstetrics and Gynecology

## 2019-07-14 ENCOUNTER — Other Ambulatory Visit: Payer: Self-pay | Admitting: Obstetrics and Gynecology

## 2019-07-14 DIAGNOSIS — Z1231 Encounter for screening mammogram for malignant neoplasm of breast: Secondary | ICD-10-CM

## 2019-07-21 ENCOUNTER — Ambulatory Visit: Payer: 59

## 2019-07-23 ENCOUNTER — Ambulatory Visit
Admission: RE | Admit: 2019-07-23 | Discharge: 2019-07-23 | Disposition: A | Discharge status: Home / Self Care | Payer: 59 | Source: Ambulatory Visit | Attending: Obstetrics and Gynecology | Admitting: Obstetrics and Gynecology

## 2019-07-23 ENCOUNTER — Other Ambulatory Visit: Payer: Self-pay

## 2019-07-23 DIAGNOSIS — Z1231 Encounter for screening mammogram for malignant neoplasm of breast: Secondary | ICD-10-CM

## 2019-08-27 ENCOUNTER — Encounter: Payer: 59 | Admitting: Registered Nurse

## 2019-08-28 ENCOUNTER — Ambulatory Visit: Payer: 59 | Admitting: Registered Nurse

## 2019-09-03 ENCOUNTER — Encounter: Payer: Self-pay | Admitting: Registered Nurse

## 2019-09-03 ENCOUNTER — Other Ambulatory Visit: Payer: Self-pay

## 2019-09-03 ENCOUNTER — Encounter: Payer: 59 | Attending: Physical Medicine & Rehabilitation | Admitting: Registered Nurse

## 2019-09-03 VITALS — BP 133/80 | HR 75 | Temp 97.5°F | Ht 61.0 in | Wt 289.0 lb

## 2019-09-03 DIAGNOSIS — Z5181 Encounter for therapeutic drug level monitoring: Secondary | ICD-10-CM | POA: Diagnosis present

## 2019-09-03 DIAGNOSIS — Z79891 Long term (current) use of opiate analgesic: Secondary | ICD-10-CM | POA: Diagnosis present

## 2019-09-03 DIAGNOSIS — M170 Bilateral primary osteoarthritis of knee: Secondary | ICD-10-CM | POA: Insufficient documentation

## 2019-09-03 DIAGNOSIS — G894 Chronic pain syndrome: Secondary | ICD-10-CM | POA: Diagnosis present

## 2019-09-03 MED ORDER — BELBUCA 450 MCG BU FILM: 450.0000 ug | ORAL_FILM | Freq: Two times a day (BID) | BUCCAL | 2 refills | Status: DC

## 2019-09-03 NOTE — Progress Notes (Addendum)
Subjective:    Patient ID: Anita Booker, female    DOB: 1961-01-12, 59 y.o.   MRN: [id number]  HPI: Anita Booker is a 59 y.o. female who returns for follow up appointment for chronic pain and medication refill. She states her pain is located in her bilateral hips and bilateral knees. She rates her pain 6. Her current exercise regime is riding her stationary bicycle for 10 minutes twice a day and performing stretching exercises.  [name]  Oral Swab was  Performed Today.    Pain Inventory Average Pain 8 Pain Right Now 6 My pain is constant, sharp, burning and aching  In the last 24 hours, has pain interfered with the following? General activity 7 Relation with others 7 Enjoyment of life 9 What TIME of day is your pain at its worst? evening, night Sleep (in general) Poor  Pain is worse with: walking and standing Pain improves with: rest, heat/ice and medication Relief from Meds: 5  Mobility walk with assistance use a cane use a walker ability to climb steps?  no do you drive?  yes use a wheelchair transfers alone  Function employed # of hrs/week 0 disabled: date disabled short term disability  Neuro/Psych weakness tingling trouble walking  Prior Studies Any changes since last visit?  no  Physicians involved in your care Any changes since last visit?  no   Family History  Problem Relation Age of Onset  . Lung cancer Father 42  . Cancer Father        lung  . Healthy Brother   . Uterine cancer Mother 17  . Heart attack Brother 44  . Cancer Paternal Grandmother        breast  . Breast cancer Sister 58       identical twin  . Lung cancer Paternal Uncle        all four uncles had lung cancer and were smokers  . Healthy Sister   . Breast cancer Other   . Breast cancer Cousin        paternal cousin dx in her 72s  . Liver cancer Cousin        paternal cousin with hepatitis and liver cancer   Social History   Socioeconomic History   . Marital status: Married    Spouse name: Not on file  . Number of children: Not on file  . Years of education: Not on file  . Highest education level: Not on file  Occupational History  . Not on file  Tobacco Use  . Smoking status: Never Smoker  . Smokeless tobacco: Never Used  Vaping Use  . Vaping Use: Never used  Substance and Sexual Activity  . Alcohol use: No  . Drug use: No  . Sexual activity: Not on file  Other Topics Concern  . Not on file  Social History Narrative  . Not on file   Social Determinants of Health   Financial Resource Strain:   . Difficulty of Paying Living Expenses:   Food Insecurity:   . Worried About Charity fundraiser in the Last Year:   . Arboriculturist in the Last Year:   Transportation Needs:   . Film/video editor (Medical):   Marland Kitchen Lack of Transportation (Non-Medical):   Physical Activity:   . Days of Exercise per Week:   . Minutes of Exercise per Session:   Stress:   . Feeling of Stress :   Social Connections:   .  Frequency of Communication with Friends and Family:   . Frequency of Social Gatherings with Friends and Family:   . Attends Religious Services:   . Active Member of Clubs or Organizations:   . Attends Banker Meetings:   Marland Kitchen Marital Status:    Past Surgical History:  Procedure Laterality Date  . BREAST EXCISIONAL BIOPSY Right    infection to right breast 5 years ago.  Marland Kitchen BREAST SURGERY  07/01/2011.   I & D of right breast abscess  . CHOLECYSTECTOMY  1995  . COLONOSCOPY WITH PROPOFOL N/A 07/05/2016   Procedure: COLONOSCOPY WITH PROPOFOL;  Surgeon: Willis Modena, MD;  [facility];  Service: Endoscopy;  Laterality: N/A;  . LEFT HEART CATH AND CORONARY ANGIOGRAPHY N/A 03/26/2017   Procedure: LEFT HEART CATH AND CORONARY ANGIOGRAPHY;  Surgeon: Yvonne Kendall, MD;  [facility];  Service: Cardiovascular;  Laterality: N/A;   Past Medical History:  Diagnosis Date  . Arthritis   .  Complication of anesthesia 1995   woke up on table right after gallbladder surgery  . Family history of adverse reaction to anesthesia    sister coded twice with anesthesai not sure of details sister still living  . Family history of breast cancer   . Family history of uterine cancer   . Headache    migraines  . Other and unspecified hyperlipidemia   . Unspecified essential hypertension    BP 133/80   Pulse 75   Temp (!) 97.5 F (36.4 C)   Ht 5\' 1"  (1.549 m)   Wt 289 lb (131.1 kg)   SpO2 92%   BMI 54.61 kg/m   Opioid Risk Score:   Fall Risk Score:  `1  Depression screen PHQ 2/9  Depression screen [facility] 2/9 08/21/2018 05/21/2018 04/23/2018 04/04/2016 07/15/2015 12/14/2014 08/04/2014  Decreased Interest 0 0 0 0 1 0 0  Down, Depressed, Hopeless 0 0 0 0 0 0 0  PHQ - 2 Score 0 0 0 0 1 0 0  Altered sleeping - - - - 1 - -  Tired, decreased energy - - - - 1 - -  Change in appetite - - - - 0 - -  Feeling bad or failure about yourself  - - - - 0 - -  Trouble concentrating - - - - 0 - -  Moving slowly or fidgety/restless - - - - 0 - -  Suicidal thoughts - - - - 0 - -  PHQ-9 Score - - - - 3 - -    Review of Systems  Musculoskeletal: Positive for arthralgias and gait problem.  Neurological: Positive for weakness.       Tingling   All other systems reviewed and are negative.      Objective:   Physical Exam Vitals and nursing note reviewed.  Constitutional:      Appearance: Normal appearance.  Cardiovascular:     Rate and Rhythm: Normal rate and regular rhythm.     Pulses: Normal pulses.     Heart sounds: Normal heart sounds.  Pulmonary:     Effort: Pulmonary effort is normal.     Breath sounds: Normal breath sounds.  Musculoskeletal:     Cervical back: Normal range of motion and neck supple.     Comments: Normal Muscle Bulk and Muscle Testing Reveals:  Upper Extremities: Full ROM and Muscle Strength 5/5  Lower Extremities: Decreased ROM and Muscle Strength 4/5 Bilateral Lower  Extremity Flexion Produces Pain into Her Bilateral  Patella's Arrived in scooter  Skin:    General: Skin is warm and dry.  Neurological:     Mental Status: She is alert and oriented to person, place, and time.  Psychiatric:        Mood and Affect: Mood normal.        Behavior: Behavior normal.           Assessment & Plan:  1. 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 Continue HEP as Tolerated. 09/03/2019 Refilled: Belbuca 300 MCG Fil inside cheek every 12 hours #60. We will continue the opioid monitoring program, this consists of regular clinic visits, examinations, urine drug screen, pill counts as well as use of [place] Controlled Substance Reporting system. 2. Bilateral Hip osteoarthritis: Continue HEP as Tolerated. Continue to Monitor. 09/03/2019 3. Bilateral Greater Trochanteric Tenderness: Continue to Alternate Ice and Heat Therapy. Continue to Monitor. 09/03/2019  of face to face patient care time was spent during this visit. All questions were encouraged and answered.  F/U in 3 months

## 2019-09-07 LAB — DRUG TOX MONITOR 1 W/CONF, ORAL FLD
Amphetamines: NEGATIVE ng/mL (ref <10)
Barbiturates: NEGATIVE ng/mL (ref <10)
Benzodiazepines: NEGATIVE ng/mL (ref <0.50)
Buprenorphine: >25 ng/mL — ABNORMAL HIGH (ref <0.10)
Buprenorphine: POSITIVE ng/mL — AB (ref <0.10)
Cocaine: NEGATIVE ng/mL (ref <5.0)
Fentanyl: NEGATIVE ng/mL (ref <0.10)
Heroin Metabolite: NEGATIVE ng/mL (ref <1.0)
MARIJUANA: NEGATIVE ng/mL (ref <2.5)
MDMA: NEGATIVE ng/mL (ref <10)
Meprobamate: NEGATIVE ng/mL (ref <2.5)
Methadone: NEGATIVE ng/mL (ref <5.0)
Naloxone: NEGATIVE ng/mL (ref <0.25)
Nicotine Metabolite: NEGATIVE ng/mL (ref <5.0)
Norbuprenorphine: 2.12 ng/mL — ABNORMAL HIGH (ref <0.50)
Opiates: NEGATIVE ng/mL (ref <2.5)
Phencyclidine: NEGATIVE ng/mL (ref <10)
Tapentadol: NEGATIVE ng/mL (ref <5.0)
Tramadol: NEGATIVE ng/mL (ref <5.0)
Zolpidem: NEGATIVE ng/mL (ref <5.0)

## 2019-09-07 LAB — DRUG TOX ALC METAB W/CON, ORAL FLD: Alcohol Metabolite: NEGATIVE ng/mL (ref <25)

## 2019-09-08 ENCOUNTER — Telehealth: Payer: Self-pay | Admitting: *Deleted

## 2019-09-08 NOTE — Telephone Encounter (Signed)
Oral swab drug screen was consistent for prescribed medications.  ?

## 2019-11-26 ENCOUNTER — Telehealth: Payer: Self-pay | Admitting: *Deleted

## 2019-11-26 NOTE — Telephone Encounter (Signed)
The nurse who works with Cannon Kettle, NP at [facility] called to report concurrent use of butalbital and belbucca.  She states that Cannon Kettle, NP with [facility] physicians has been prescribing the butalbital and that Jacalyn Lefevre, ANP has been prescribing the Belbucca.  The nurse states that they have cancelled the butalbital Rx. FYI

## 2019-12-04 ENCOUNTER — Ambulatory Visit: Payer: 59 | Admitting: Registered Nurse

## 2019-12-05 ENCOUNTER — Other Ambulatory Visit: Payer: Self-pay

## 2019-12-05 ENCOUNTER — Encounter: Payer: Self-pay | Admitting: Registered Nurse

## 2019-12-05 ENCOUNTER — Encounter: Payer: 59 | Attending: Physical Medicine & Rehabilitation | Admitting: Registered Nurse

## 2019-12-05 VITALS — BP 154/80 | HR 70 | Temp 98.2°F | Ht 62.0 in | Wt 281.0 lb

## 2019-12-05 DIAGNOSIS — G43009 Migraine without aura, not intractable, without status migrainosus: Secondary | ICD-10-CM | POA: Diagnosis present

## 2019-12-05 DIAGNOSIS — M170 Bilateral primary osteoarthritis of knee: Secondary | ICD-10-CM | POA: Diagnosis present

## 2019-12-05 DIAGNOSIS — Z5181 Encounter for therapeutic drug level monitoring: Secondary | ICD-10-CM

## 2019-12-05 DIAGNOSIS — G894 Chronic pain syndrome: Secondary | ICD-10-CM | POA: Diagnosis present

## 2019-12-05 DIAGNOSIS — M7061 Trochanteric bursitis, right hip: Secondary | ICD-10-CM

## 2019-12-05 DIAGNOSIS — Z79891 Long term (current) use of opiate analgesic: Secondary | ICD-10-CM | POA: Diagnosis present

## 2019-12-05 DIAGNOSIS — M7062 Trochanteric bursitis, left hip: Secondary | ICD-10-CM

## 2019-12-05 MED ORDER — BUPRENORPHINE HCL 450 MCG BU FILM: 450.0000 ug | ORAL_FILM | Freq: Two times a day (BID) | BUCCAL | 2 refills | Status: DC

## 2019-12-05 NOTE — Progress Notes (Signed)
Subjective:    Patient ID: Anita Booker, female    DOB: 29-Oct-1960, 59 y.o.   MRN: [id number]  HPI: Anita Booker is a 59 y.o. female who returns for follow up appointment for chronic pain and medication refill. She states her pain is located in her bilateral hips and bilateral knees. [name] reports her PCP won't prescribe her Fiorcet due to a flag from the pharmacy. Purvis Sheffield CMA called and spoke with pharmacist, he  stated the reason was respiratory depression, but provider can over-ride the decision. [name] reports her migraine has increased in intensity and frequency since her son passed away on 11-27-2019, emotional support given. [name] states she's compliant with the Pamelor which has helped her migraines. Her migraines has increased due to family stressor with the lost of her son,  this was discussed with Dr [name]. We can prescribe her Fiorcet for one month, but she wouldn't be able to take her Pamelor at the same time. [name] will continue with the Pamelor, this provider placed a referral to neurology, she verbalizes understanding and agrees with consult.  She rates her pain 9. Her current exercise regime is walking short distances with cane.   Last Oral Swab was Performed on 09/03/2019, it was consistent.   Pain Inventory Average Pain 7 Pain Right Now 9 My pain is constant, sharp, burning, dull, stabbing and aching  In the last 24 hours, has pain interfered with the following? General activity 7 Relation with others 7 Enjoyment of life 7 What TIME of day is your pain at its worst? evening and night Sleep (in general) Booker  Pain is worse with: walking and standing Pain improves with: rest and medication Relief from Meds: 4  Family History  Problem Relation Age of Onset  . Lung cancer Father 33  . Cancer Father        lung  . Healthy Brother   . Uterine cancer Mother 29  . Heart attack Brother 48  . Cancer Paternal  Grandmother        breast  . Breast cancer Sister 76       identical twin  . Lung cancer Paternal Uncle        all four uncles had lung cancer and were smokers  . Healthy Sister   . Breast cancer Other   . Breast cancer Cousin        paternal cousin dx in her 50s  . Liver cancer Cousin        paternal cousin with hepatitis and liver cancer   Social History   Socioeconomic History  . Marital status: Married    Spouse name: Not on file  . Number of children: Not on file  . Years of education: Not on file  . Highest education level: Not on file  Occupational History  . Not on file  Tobacco Use  . Smoking status: Never Smoker  . Smokeless tobacco: Never Used  Vaping Use  . Vaping Use: Never used  Substance and Sexual Activity  . Alcohol use: No  . Drug use: No  . Sexual activity: Not on file  Other Topics Concern  . Not on file  Social History Narrative  . Not on file   Social Determinants of Health   Financial Resource Strain:   . Difficulty of Paying Living Expenses: Not on file  Food Insecurity:   . Worried About Programme researcher, broadcasting/film/video in the Last Year: Not on  file  . Ran Out of Food in the Last Year: Not on file  Transportation Needs:   . Lack of Transportation (Medical): Not on file  . Lack of Transportation (Non-Medical): Not on file  Physical Activity:   . Days of Exercise per Week: Not on file  . Minutes of Exercise per Session: Not on file  Stress:   . Feeling of Stress : Not on file  Social Connections:   . Frequency of Communication with Friends and Family: Not on file  . Frequency of Social Gatherings with Friends and Family: Not on file  . Attends Religious Services: Not on file  . Active Member of Clubs or Organizations: Not on file  . Attends Banker Meetings: Not on file  . Marital Status: Not on file   Past Surgical History:  Procedure Laterality Date  . BREAST EXCISIONAL BIOPSY Right    infection to right breast 5 years ago.  Marland Kitchen  BREAST SURGERY  07/01/2011.   I & D of right breast abscess  . CHOLECYSTECTOMY  1995  . COLONOSCOPY WITH PROPOFOL N/A 07/05/2016   Procedure: COLONOSCOPY WITH PROPOFOL;  Surgeon: Willis Modena, MD;  Location: [facility];  Service: Endoscopy;  Laterality: N/A;  . LEFT HEART CATH AND CORONARY ANGIOGRAPHY N/A 03/26/2017   Procedure: LEFT HEART CATH AND CORONARY ANGIOGRAPHY;  Surgeon: Yvonne Kendall, MD;  Location: [facility];  Service: Cardiovascular;  Laterality: N/A;   Past Surgical History:  Procedure Laterality Date  . BREAST EXCISIONAL BIOPSY Right    infection to right breast 5 years ago.  Marland Kitchen BREAST SURGERY  07/01/2011.   I & D of right breast abscess  . CHOLECYSTECTOMY  1995  . COLONOSCOPY WITH PROPOFOL N/A 07/05/2016   Procedure: COLONOSCOPY WITH PROPOFOL;  Surgeon: Willis Modena, MD;  Location: [facility];  Service: Endoscopy;  Laterality: N/A;  . LEFT HEART CATH AND CORONARY ANGIOGRAPHY N/A 03/26/2017   Procedure: LEFT HEART CATH AND CORONARY ANGIOGRAPHY;  Surgeon: Yvonne Kendall, MD;  Location: [facility];  Service: Cardiovascular;  Laterality: N/A;   Past Medical History:  Diagnosis Date  . Arthritis   . Complication of anesthesia 1995   woke up on table right after gallbladder surgery  . Family history of adverse reaction to anesthesia    sister coded twice with anesthesai not sure of details sister still living  . Family history of breast cancer   . Family history of uterine cancer   . Headache    migraines  . Other and unspecified hyperlipidemia   . Unspecified essential hypertension    BP (!) 154/80   Pulse 70   Temp 98.2 F (36.8 C)   Ht 5\' 2"  (1.575 m)   Wt 281 lb (127.5 kg)   SpO2 93%   BMI 51.40 kg/m   Opioid Risk Score:   Fall Risk Score:  `1  Depression screen PHQ 2/9  Depression screen [facility] 2/9 08/21/2018 05/21/2018 04/23/2018 04/04/2016 07/15/2015 12/14/2014 08/04/2014  Decreased Interest 0 0 0 0 1 0 0  Down, Depressed, Hopeless 0 0 0 0 0  0 0  PHQ - 2 Score 0 0 0 0 1 0 0  Altered sleeping - - - - 1 - -  Tired, decreased energy - - - - 1 - -  Change in appetite - - - - 0 - -  Feeling bad or failure about yourself  - - - - 0 - -  Trouble concentrating - - - -  0 - -  Moving slowly or fidgety/restless - - - - 0 - -  Suicidal thoughts - - - - 0 - -  PHQ-9 Score - - - - 3 - -    Review of Systems  Constitutional: Negative.   HENT: Negative.   Eyes: Negative.   Respiratory: Negative.   Cardiovascular: Negative.   Gastrointestinal: Negative.   Endocrine: Negative.   Genitourinary: Negative.   Musculoskeletal: Positive for arthralgias and gait problem.  Skin: Negative.   Hematological: Negative.   Psychiatric/Behavioral: Negative.   All other systems reviewed and are negative.      Objective:   Physical Exam Vitals and nursing note reviewed.  Constitutional:      Appearance: Normal appearance.  Cardiovascular:     Rate and Rhythm: Normal rate and regular rhythm.     Pulses: Normal pulses.     Heart sounds: Normal heart sounds.  Pulmonary:     Effort: Pulmonary effort is normal.     Breath sounds: Normal breath sounds.  Musculoskeletal:     Cervical back: Normal range of motion and neck supple.     Comments: Normal Muscle Bulk and Muscle Testing Reveals:  Upper Extremities: Full ROM and Muscle Strength 5/5 Lower Extremities: Decreased ROM and Muscle Strength 5/5 Bilateral Lower Extremities Flexion Produces Pain into Bilateral Patella's and Bilateral Lower Extremities Arrived on scooter   Skin:    General: Skin is warm and dry.  Neurological:     Mental Status: She is alert and oriented to person, place, and time.  Psychiatric:        Mood and Affect: Mood normal.        Behavior: Behavior normal.           Assessment & Plan:  1. 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 Continue HEP as Tolerated.12/05/2019 Refilled: Belbuca 450 MCG Film inside cheek every 12 hours #60. We  will continue the opioid monitoring program, this consists of regular clinic visits, examinations, urine drug screen, pill counts as well as use of [place] Controlled Substance Reporting system. A 12 month History has been reviewed on the [place] Controlled Substance Reporting System on 12/05/2019. 2. Bilateral Hip osteoarthritis: Continue HEP as Tolerated. Continue to Monitor.12/05/2019 3. Bilateral Greater Trochanteric Tenderness: Continue to Alternate Ice and Heat Therapy. Continue to Monitor.12/05/2019 4. Migraines Witho Aura: RX: Neurology. Continue Pamelor. Continue to Monitor.   of face to face patient care time was spent during this visit. All questions were encouraged and answered.  F/U in68months

## 2019-12-26 ENCOUNTER — Encounter: Payer: Self-pay | Admitting: Neurology

## 2020-03-02 ENCOUNTER — Other Ambulatory Visit: Payer: Self-pay

## 2020-03-02 ENCOUNTER — Encounter: Payer: Self-pay | Admitting: Registered Nurse

## 2020-03-02 ENCOUNTER — Encounter: Payer: 59 | Attending: Physical Medicine & Rehabilitation | Admitting: Registered Nurse

## 2020-03-02 VITALS — BP 138/83 | HR 75 | Temp 97.8°F

## 2020-03-02 DIAGNOSIS — G894 Chronic pain syndrome: Secondary | ICD-10-CM

## 2020-03-02 DIAGNOSIS — M170 Bilateral primary osteoarthritis of knee: Secondary | ICD-10-CM | POA: Diagnosis present

## 2020-03-02 DIAGNOSIS — M7062 Trochanteric bursitis, left hip: Secondary | ICD-10-CM | POA: Insufficient documentation

## 2020-03-02 DIAGNOSIS — M7061 Trochanteric bursitis, right hip: Secondary | ICD-10-CM | POA: Insufficient documentation

## 2020-03-02 DIAGNOSIS — Z5181 Encounter for therapeutic drug level monitoring: Secondary | ICD-10-CM | POA: Diagnosis present

## 2020-03-02 DIAGNOSIS — Z79891 Long term (current) use of opiate analgesic: Secondary | ICD-10-CM | POA: Insufficient documentation

## 2020-03-02 MED ORDER — BELBUCA 450 MCG BU FILM
450.0000 ug | ORAL_FILM | Freq: Two times a day (BID) | BUCCAL | 2 refills | Status: DC
Start: 2020-03-02 — End: 2020-03-17

## 2020-03-02 NOTE — Progress Notes (Signed)
Subjective:    Patient ID: Anita Booker, female    DOB: 1960-08-03, 59 y.o.   MRN: [id number]  HPI: Anita Booker is a 59 y.o. female who returns for follow up appointment for chronic pain and medication refill. She states her  pain is located in her bilateral hips and bilateral knees. She rates her  Pain 6. Her current exercise regime is walking short distances in her home to the bathroom with cane or walker.   Oral Swab was Performed today.    Oral Swab was Ordered today.   Pain Inventory Average Pain 7 Pain Right Now 6 My pain is constant, sharp, burning, dull, stabbing and aching  In the last 24 hours, has pain interfered with the following? General activity 7 Relation with others 6 Enjoyment of life 8 What TIME of day is your pain at its worst? evening Sleep (in general) Fair  Pain is worse with: walking and standing Pain improves with: rest, heat/ice, pacing activities and medication Relief from Meds: 5  Family History  Problem Relation Age of Onset  . Lung cancer Father 19  . Cancer Father        lung  . Healthy Brother   . Uterine cancer Mother 53  . Heart attack Brother 48  . Cancer Paternal Grandmother        breast  . Breast cancer Sister 44       identical twin  . Lung cancer Paternal Uncle        all four uncles had lung cancer and were smokers  . Healthy Sister   . Breast cancer Other   . Breast cancer Cousin        paternal cousin dx in her 43s  . Liver cancer Cousin        paternal cousin with hepatitis and liver cancer   Social History   Socioeconomic History  . Marital status: Married    Spouse name: Not on file  . Number of children: Not on file  . Years of education: Not on file  . Highest education level: Not on file  Occupational History  . Not on file  Tobacco Use  . Smoking status: Never Smoker  . Smokeless tobacco: Never Used  Vaping Use  . Vaping Use: Never used  Substance and Sexual Activity  . Alcohol use:  No  . Drug use: No  . Sexual activity: Not on file  Other Topics Concern  . Not on file  Social History Narrative  . Not on file   Social Determinants of Health   Financial Resource Strain: Not on file  Food Insecurity: Not on file  Transportation Needs: Not on file  Physical Activity: Not on file  Stress: Not on file  Social Connections: Not on file   Past Surgical History:  Procedure Laterality Date  . BREAST EXCISIONAL BIOPSY Right    infection to right breast 5 years ago.  Marland Kitchen BREAST SURGERY  07/01/2011.   I & D of right breast abscess  . CHOLECYSTECTOMY  1995  . COLONOSCOPY WITH PROPOFOL N/A 07/05/2016   Procedure: COLONOSCOPY WITH PROPOFOL;  Surgeon: Willis Modena, MD;  Location: [facility];  Service: Endoscopy;  Laterality: N/A;  . LEFT HEART CATH AND CORONARY ANGIOGRAPHY N/A 03/26/2017   Procedure: LEFT HEART CATH AND CORONARY ANGIOGRAPHY;  Surgeon: Yvonne Kendall, MD;  [facility];  Service: Cardiovascular;  Laterality: N/A;   Past Surgical History:  Procedure Laterality Date  . BREAST  EXCISIONAL BIOPSY Right    infection to right breast 5 years ago.  Marland Kitchen BREAST SURGERY  07/01/2011.   I & D of right breast abscess  . CHOLECYSTECTOMY  1995  . COLONOSCOPY WITH PROPOFOL N/A 07/05/2016   Procedure: COLONOSCOPY WITH PROPOFOL;  Surgeon: Willis Modena, MD;  Location: [facility];  Service: Endoscopy;  Laterality: N/A;  . LEFT HEART CATH AND CORONARY ANGIOGRAPHY N/A 03/26/2017   Procedure: LEFT HEART CATH AND CORONARY ANGIOGRAPHY;  Surgeon: Yvonne Kendall, MD;  Location: [facility];  Service: Cardiovascular;  Laterality: N/A;   Past Medical History:  Diagnosis Date  . Arthritis   . Complication of anesthesia 1995   woke up on table right after gallbladder surgery  . Family history of adverse reaction to anesthesia    sister coded twice with anesthesai not sure of details sister still living  . Family history of breast cancer   . Family history  of uterine cancer   . Headache    migraines  . Other and unspecified hyperlipidemia   . Unspecified essential hypertension    BP 138/83   Pulse 75   Temp 97.8 F (36.6 C)   SpO2 91%   Opioid Risk Score:   Fall Risk Score:  `1  Depression screen PHQ 2/9  Depression screen [facility] 2/9 08/21/2018 05/21/2018 04/23/2018 04/04/2016 07/15/2015 12/14/2014 08/04/2014  Decreased Interest 0 0 0 0 1 0 0  Down, Depressed, Hopeless 0 0 0 0 0 0 0  PHQ - 2 Score 0 0 0 0 1 0 0  Altered sleeping - - - - 1 - -  Tired, decreased energy - - - - 1 - -  Change in appetite - - - - 0 - -  Feeling bad or failure about yourself  - - - - 0 - -  Trouble concentrating - - - - 0 - -  Moving slowly or fidgety/restless - - - - 0 - -  Suicidal thoughts - - - - 0 - -  PHQ-9 Score - - - - 3 - -    Review of Systems     Objective:   Physical Exam Vitals and nursing note reviewed.  Constitutional:      Appearance: Normal appearance.  Cardiovascular:     Rate and Rhythm: Normal rate and regular rhythm.     Pulses: Normal pulses.     Heart sounds: Normal heart sounds.  Pulmonary:     Effort: Pulmonary effort is normal.     Breath sounds: Normal breath sounds.  Musculoskeletal:     Cervical back: Normal range of motion and neck supple.     Comments: Normal Muscle Bulk and Muscle Testing Reveals:  Upper Extremities: Full ROM and Muscle Strength 5/5 Bilateral Greater Trochanter Tenderness Lower Extremities: Decreased ROM and Muscle Strength 5/5 Bilateral Lower Extremities Flexion Produces Pain into his bilateral patella's Arrived in motorized wheelchair    Skin:    General: Skin is warm and dry.  Neurological:     Mental Status: She is alert and oriented to person, place, and time.  Psychiatric:        Mood and Affect: Mood normal.        Behavior: Behavior normal.           Assessment & Plan:  1. 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 Continue HEP as  Tolerated.03/02/2020 Refilled: Belbuca 450 MCG Film inside cheek every 12 hours #60. We will continue the opioid monitoring program,  this consists of regular clinic visits, examinations, urine drug screen, pill counts as well as use of [place] Controlled Substance Reporting system. A 12 month History has been reviewed on the [place] Controlled Substance Reporting System on 03/02/2020. 2. Bilateral Hip osteoarthritis: Continue HEP as Tolerated. Continue to Monitor.03/02/2020 3. Bilateral Greater Trochanteric Tenderness: Continue to Alternate Ice and Heat Therapy. Continue to Monitor.03/02/2020 4. Migraines Witho Aura: She has an appointment with  Neurology. Continue Pamelor. Continue to Monitor. 03/02/2020  F/U in 3 months

## 2020-03-09 LAB — DRUG TOX MONITOR 1 W/CONF, ORAL FLD
Amobarbital: NEGATIVE ng/mL (ref <10)
Amphetamines: NEGATIVE ng/mL (ref <10)
Barbiturates: POSITIVE ng/mL — AB (ref <10)
Benzodiazepines: NEGATIVE ng/mL (ref <0.50)
Buprenorphine: >25 ng/mL — ABNORMAL HIGH (ref <0.10)
Buprenorphine: POSITIVE ng/mL — AB (ref <0.10)
Butalbital: 26 ng/mL — ABNORMAL HIGH (ref <10)
Cocaine: NEGATIVE ng/mL (ref <5.0)
Fentanyl: NEGATIVE ng/mL (ref <0.10)
Heroin Metabolite: NEGATIVE ng/mL (ref <1.0)
MARIJUANA: NEGATIVE ng/mL (ref <2.5)
MDMA: NEGATIVE ng/mL (ref <10)
Meprobamate: NEGATIVE ng/mL (ref <2.5)
Methadone: NEGATIVE ng/mL (ref <5.0)
Naloxone: NEGATIVE ng/mL (ref <0.25)
Nicotine Metabolite: NEGATIVE ng/mL (ref <5.0)
Norbuprenorphine: 1.53 ng/mL — ABNORMAL HIGH (ref <0.50)
Opiates: NEGATIVE ng/mL (ref <2.5)
Pentobarbital: NEGATIVE ng/mL (ref <10)
Phencyclidine: NEGATIVE ng/mL (ref <10)
Phenobarbital: NEGATIVE ng/mL (ref <10)
Secobarbital: NEGATIVE ng/mL (ref <10)
Tapentadol: NEGATIVE ng/mL (ref <5.0)
Tramadol: NEGATIVE ng/mL (ref <5.0)
Zolpidem: NEGATIVE ng/mL (ref <5.0)

## 2020-03-09 LAB — DRUG TOX ALC METAB W/CON, ORAL FLD: Alcohol Metabolite: NEGATIVE ng/mL (ref <25)

## 2020-03-11 ENCOUNTER — Other Ambulatory Visit: Payer: Self-pay | Admitting: Registered Nurse

## 2020-03-16 NOTE — Progress Notes (Signed)
NEUROLOGY CONSULTATION NOTE  Collyns Mcquigg MRN: [id number] DOB: [birth year]  Referring provider: Jacalyn Lefevre, NP Primary care provider: Marinda Elk, MD  Reason for consult:  migraines   Subjective:  Anita Booker is a 59 year old right-handed female with chronic pain related to osteoarthritis of the hips and knees who presents for migraines.  History supplemented by referring provider's notes.  She has history of migraines since around 59 years old.  They are severe bilateral retro-orbital/frontal pressure pain with photophobia but no nausea, vomiting, phonophobia, visual disturbance.  They would last 2 hours with Fioricet.  They were well-maintained on nortriptyline, occurring only 1 or 2 times a year.  However, they increased in August after the loss of her son.  They are now occurring 2 to 3 days a week.  Her pharmacy will not fill her Fioricet because she is also taking Belbuca for chronic pain.  Current NSAIDS/analgesics:  ASA 81mg  daily, Celebrex, lidocaine 5% patch Current triptans:  none Current ergotamine:  none Current anti-emetic:  none Current muscle relaxants:  none Current Antihypertensive medications:  valsartan Current Antidepressant medications:  Nortriptyline 100mg  at bedtime, Wellbutrin SR 100mg  daily Current Anticonvulsant medications:  none Current anti-CGRP:  none Current Vitamins/Herbal/Supplements:  MVI Current Antihistamines/Decongestants:  none Other therapy:  none Hormone/birth control:  none Other medications:  Ambien, Belbuca  Past NSAIDS/analgesics:  Tylenol, tramadol Past abortive triptans:  none Past abortive ergotamine:  none Past muscle relaxants:  Flexeril Past anti-emetic:  none Past antihypertensive medications:  Lasix, irbesartan Past antidepressant medications:  none Past anticonvulsant medications:  none Past anti-CGRP:  none Past vitamins/Herbal/Supplements:  none Past antihistamines/decongestants:  none Other past  therapies:  none  Depression:  yes; Anxiety:  yes Other pain:  Chronic pain with OA in hips and history of prior fractures of the knees  Prior imaging (Personally reviewed): 05/14/2014 MRI CERVICAL SPINE WO:  1. Mild cervical spondylosis and degenerative disc disease, causing mild impingement at C5-6 and borderline impingement at C4-5 as noted above. 2. Loss of the normal cervical lordosis, which can be associated with muscle spasm. 07/18/2013 CT HEAD WO:  No acute intracranial abnormality.     PAST MEDICAL HISTORY: Past Medical History:  Diagnosis Date  . Arthritis   . Complication of anesthesia 1995   woke up on table right after gallbladder surgery  . Family history of adverse reaction to anesthesia    sister coded twice with anesthesai not sure of details sister still living  . Family history of breast cancer   . Family history of uterine cancer   . Headache    migraines  . Other and unspecified hyperlipidemia   . Unspecified essential hypertension     PAST SURGICAL HISTORY: Past Surgical History:  Procedure Laterality Date  . BREAST EXCISIONAL BIOPSY Right    infection to right breast 5 years ago.  BREAST SURGERY  07/01/2011.   I & D of right breast abscess  . CHOLECYSTECTOMY  1995  . COLONOSCOPY WITH PROPOFOL N/A 07/05/2016   Procedure: COLONOSCOPY WITH PROPOFOL;  Surgeon: Marland Kitchen, MD;  Location: [facility];  Service: Endoscopy;  Laterality: N/A;  . LEFT HEART CATH AND CORONARY ANGIOGRAPHY N/A 03/26/2017   Procedure: LEFT HEART CATH AND CORONARY ANGIOGRAPHY;  Surgeon: 07/07/2016, MD;  Location: [facility];  Service: Cardiovascular;  Laterality: N/A;    MEDICATIONS: Current Outpatient Medications on File Prior to Visit  Medication Sig Dispense Refill  . aspirin EC 81 MG tablet Take  81 mg by mouth daily.     Marland Kitchen atorvastatin (LIPITOR) 40 MG tablet TAKE 1 TABLET DAILY 90 tablet 0  . Buprenorphine HCl (BELBUCA) 450 MCG FILM Place 1 Film (450 mcg  total) inside cheek every 12 (twelve) hours. 60 each 2  . buPROPion (WELLBUTRIN SR) 100 MG 12 hr tablet Take 100 mg by mouth daily.    . Calcium Carbonate (CALCIUM 600 PO) Take 1 tablet by mouth daily.    . celecoxib (CELEBREX) 200 MG capsule Take 1 capsule (200 mg total) by mouth daily as needed. 90 capsule 3  . cholecalciferol (VITAMIN D) 1000 units tablet Take 1,000 Units by mouth daily.    Marland Kitchen lidocaine (LIDODERM) 5 % APPLY ON PATCH EACH KNEE DAILY REMOVE & DISCARD PATCH WITHIN 12 HOURS OR AS DIRECTED BY MD 60 patch 6  . Multiple Vitamins-Minerals (MULTIVITAMIN WITH MINERALS) tablet Take 1 tablet by mouth daily.    . nitroGLYCERIN (NITROSTAT) 0.4 MG SL tablet Place 1 tablet (0.4 mg total) under the tongue every 5 (five) minutes as needed for chest pain. 11 tablet 6  . nortriptyline (PAMELOR) 50 MG capsule Take 100 mg by mouth at bedtime. Reported on 06/04/2015    . valsartan (DIOVAN) 320 MG tablet Take 320 mg by mouth daily.  0  . zolpidem (AMBIEN) 10 MG tablet Take 10 mg by mouth at bedtime as needed. Reported on 06/04/2015     No current facility-administered medications on file prior to visit.    ALLERGIES: Allergies  Allergen Reactions  . Butrans [Buprenorphine] Rash    Patch causes severe skin rash  . Codeine Nausea Only    FAMILY HISTORY: Family History  Problem Relation Age of Onset  . Lung cancer Father 3  . Cancer Father        lung  . Healthy Brother   . Uterine cancer Mother 14  . Heart attack Brother 48  . Cancer Paternal Grandmother        breast  . Breast cancer Sister 70       identical twin  . Lung cancer Paternal Uncle        all four uncles had lung cancer and were smokers  . Healthy Sister   . Breast cancer Other   . Breast cancer Cousin        paternal cousin dx in her 48s  . Liver cancer Cousin        paternal cousin with hepatitis and liver cancer   SOCIAL HISTORY: Social History   Socioeconomic History  . Marital status: Married    Spouse  name: Not on file  . Number of children: Not on file  . Years of education: Not on file  . Highest education level: Not on file  Occupational History  . Not on file  Tobacco Use  . Smoking status: Never Smoker  . Smokeless tobacco: Never Used  Vaping Use  . Vaping Use: Never used  Substance and Sexual Activity  . Alcohol use: No  . Drug use: No  . Sexual activity: Not on file  Other Topics Concern  . Not on file  Social History Narrative  . Not on file   Social Determinants of Health   Financial Resource Strain: Not on file  Food Insecurity: Not on file  Transportation Needs: Not on file  Physical Activity: Not on file  Stress: Not on file  Social Connections: Not on file  Intimate Partner Violence: Not on file    Objective:  Blood pressure 126/79, pulse 79, height 5\' 1"  (1.549 m), weight 281 lb (127.5 kg), SpO2 95 %. General: No acute distress.  Patient appears well-groomed.   Head:  Normocephalic/atraumatic Eyes:  fundi examined but not visualized Neck: supple, no paraspinal tenderness, full range of motion Back: No paraspinal tenderness Heart: regular rate and rhythm Lungs: Clear to auscultation bilaterally. Vascular: No carotid bruits. Neurological Exam: Mental status: alert and oriented to person, place, and time, recent and remote memory intact, fund of knowledge intact, attention and concentration intact, speech fluent and not dysarthric, language intact. Cranial nerves: CN I: not tested CN II: pupils equal, round and reactive to light, visual fields intact CN III, IV, VI:  full range of motion, no nystagmus, no ptosis CN V: facial sensation intact. CN VII: upper and lower face symmetric CN VIII: hearing intact CN IX, X: gag intact, uvula midline CN XI: sternocleidomastoid and trapezius muscles intact CN XII: tongue midline Bulk & Tone: normal, no fasciculations. Motor:  muscle strength 5/5 throughout Sensation:  Light touch sensation intact. Deep Tendon  Reflexes:  absent throughout,  toes downgoing.   Finger to nose testing:  Without dysmetria.     Gait:  Deferred.  In scooter.  Assessment/Plan:   Chronic migraine without aura, without status migrainosus, not intractable  1.  Will start topiramate 50mg  at bedtime.  We can increase dose in 6 weeks if needed.  Will eventually taper off of nortriptyline but she will continue for now. 2.  Due to frequency of her headaches, I wouldn't use Fioricet anyway.  For rescue therapy, will give her Ubrelvy.  I would not prescribe triptans due to potential drug interactions with her other medications (serotonin syndrome, etc) 3.  Limit use of pain relievers to no more than 2 days out of week to prevent risk of rebound or medication-overuse headache. 4.  Keep headache diary 5.  Caffeine cessation 6.  Follow up 6 months.     Thank you for allowing me to take part in the care of this patient.  , DO  CC:  . Shon Millet, NP  Marianna Fuss, MD

## 2020-03-17 ENCOUNTER — Ambulatory Visit (INDEPENDENT_AMBULATORY_CARE_PROVIDER_SITE_OTHER): Payer: 59 | Admitting: Neurology

## 2020-03-17 ENCOUNTER — Other Ambulatory Visit: Payer: Self-pay

## 2020-03-17 ENCOUNTER — Encounter: Payer: Self-pay | Admitting: Neurology

## 2020-03-17 VITALS — BP 126/79 | HR 79 | Ht 61.0 in | Wt 281.0 lb

## 2020-03-17 DIAGNOSIS — G43709 Chronic migraine without aura, not intractable, without status migrainosus: Secondary | ICD-10-CM

## 2020-03-17 MED ORDER — TOPIRAMATE 50 MG PO TABS: 50.0000 mg | ORAL_TABLET | Freq: Every day | ORAL | 5 refills | Status: DC

## 2020-03-17 NOTE — Patient Instructions (Signed)
  1. Start topiramate 50mg  at bedtime. If headaches not improved in 6 weeks, contact me and we can increase dose.  Remain on nortriptyline for now, with plan to eventually discontinue 2. Take Ubrelvy 100mg  at earliest onset of headache.  May repeat dose once in 2 hours if needed.  Maximum 2 tablets in 24 hours. 3. Limit use of pain relievers to no more than 2 days out of the week.  These medications include acetaminophen, NSAIDs (ibuprofen/Advil/Motrin, naproxen/Aleve, triptans (Imitrex/sumatriptan), Excedrin, and narcotics.  This will help reduce risk of rebound headaches. 4. Be aware of common food triggers:  - Caffeine:  coffee, black tea, cola, Mt. Dew  - Chocolate  - Dairy:  aged cheeses (brie, blue, cheddar, gouda, [place], provolone, [place], Swiss, etc), chocolate milk, buttermilk, sour cream, limit eggs and yogurt  - Nuts, peanut butter  - Alcohol  - Cereals/grains:  FRESH breads (fresh bagels, sourdough, doughnuts), yeast productions  - Processed/canned/aged/cured meats (pre-packaged deli meats, hotdogs)  - MSG/glutamate:  soy sauce, flavor enhancer, pickled/preserved/marinated foods  - Sweeteners:  aspartame (Equal, Nutrasweet).  Sugar and Splenda are okay  - Vegetables:  legumes (lima beans, lentils, snow peas, fava beans, pinto peans, peas, garbanzo beans), sauerkraut, onions, olives, pickles  - Fruit:  avocados, bananas, citrus fruit (orange, lemon, grapefruit), mango  - Other:  Frozen meals, macaroni and cheese 5. Routine exercise 6. Stay adequately hydrated (aim for 64 oz water daily) 7. Keep headache diary 8. Maintain proper stress management 9. Maintain proper sleep hygiene 10. Do not skip meals 11. Consider supplements:  magnesium citrate 400mg  daily, riboflavin 400mg  daily, coenzyme Q10 100mg  three times daily.

## 2020-06-01 ENCOUNTER — Encounter: Payer: 59 | Attending: Physical Medicine & Rehabilitation | Admitting: Registered Nurse

## 2020-06-01 DIAGNOSIS — Z5181 Encounter for therapeutic drug level monitoring: Secondary | ICD-10-CM | POA: Insufficient documentation

## 2020-06-01 DIAGNOSIS — M7061 Trochanteric bursitis, right hip: Secondary | ICD-10-CM | POA: Insufficient documentation

## 2020-06-01 DIAGNOSIS — Z79891 Long term (current) use of opiate analgesic: Secondary | ICD-10-CM | POA: Insufficient documentation

## 2020-06-01 DIAGNOSIS — M170 Bilateral primary osteoarthritis of knee: Secondary | ICD-10-CM | POA: Insufficient documentation

## 2020-06-01 DIAGNOSIS — M7062 Trochanteric bursitis, left hip: Secondary | ICD-10-CM | POA: Insufficient documentation

## 2020-06-01 DIAGNOSIS — G894 Chronic pain syndrome: Secondary | ICD-10-CM | POA: Insufficient documentation

## 2020-06-01 NOTE — Progress Notes (Deleted)
Subjective:    Patient ID: Anita Booker, female    DOB: 02/26/1961, 60 y.o.   MRN: [id number]  HPI  Pain Inventory Average Pain 7 Pain Right Now 6 My pain is constant, sharp, burning, dull, stabbing and aching  In the last 24 hours, has pain interfered with the following? General activity 7 Relation with others 6 Enjoyment of life 8 What TIME of day is your pain at its worst? evening Sleep (in general) Fair  Pain is worse with: walking and standing Pain improves with: rest, heat/ice, pacing activities and medication Relief from Meds: 5  Family History  Problem Relation Age of Onset  . Lung cancer Father 52  . Cancer Father        lung  . Healthy Brother   . Uterine cancer Mother 76  . Heart attack Brother 48  . Cancer Paternal Grandmother        breast  . Breast cancer Sister 53       identical twin  . Lung cancer Paternal Uncle        all four uncles had lung cancer and were smokers  . Healthy Sister   . Breast cancer Other   . Breast cancer Cousin        paternal cousin dx in her 24s  . Liver cancer Cousin        paternal cousin with hepatitis and liver cancer   Social History   Socioeconomic History  . Marital status: Married    Spouse name: Not on file  . Number of children: Not on file  . Years of education: Not on file  . Highest education level: Not on file  Occupational History  . Not on file  Tobacco Use  . Smoking status: Never Smoker  . Smokeless tobacco: Never Used  Vaping Use  . Vaping Use: Never used  Substance and Sexual Activity  . Alcohol use: No  . Drug use: No  . Sexual activity: Not on file  Other Topics Concern  . Not on file  Social History Narrative   Right handed   Social Determinants of Health   Financial Resource Strain: Not on file  Food Insecurity: Not on file  Transportation Needs: Not on file  Physical Activity: Not on file  Stress: Not on file  Social Connections: Not on file   Past Surgical History:   Procedure Laterality Date  . BREAST EXCISIONAL BIOPSY Right    infection to right breast 5 years ago.  Marland Kitchen BREAST SURGERY  07/01/2011.   I & D of right breast abscess  . CHOLECYSTECTOMY  1995  . COLONOSCOPY WITH PROPOFOL N/A 07/05/2016   Procedure: COLONOSCOPY WITH PROPOFOL;  Surgeon: Willis Modena, MD;  Location: [facility];  Service: Endoscopy;  Laterality: N/A;  . LEFT HEART CATH AND CORONARY ANGIOGRAPHY N/A 03/26/2017   Procedure: LEFT HEART CATH AND CORONARY ANGIOGRAPHY;  Surgeon: Yvonne Kendall, MD;  Location: [facility];  Service: Cardiovascular;  Laterality: N/A;   Past Surgical History:  Procedure Laterality Date  . BREAST EXCISIONAL BIOPSY Right    infection to right breast 5 years ago.  Marland Kitchen BREAST SURGERY  07/01/2011.   I & D of right breast abscess  . CHOLECYSTECTOMY  1995  . COLONOSCOPY WITH PROPOFOL N/A 07/05/2016   Procedure: COLONOSCOPY WITH PROPOFOL;  Surgeon: Willis Modena, MD;  Location: [facility];  Service: Endoscopy;  Laterality: N/A;  . LEFT HEART CATH AND CORONARY ANGIOGRAPHY N/A 03/26/2017   Procedure: LEFT  HEART CATH AND CORONARY ANGIOGRAPHY;  Surgeon: Yvonne Kendall, MD;  Location: [facility];  Service: Cardiovascular;  Laterality: N/A;   Past Medical History:  Diagnosis Date  . Arthritis   . Complication of anesthesia 1995   woke up on table right after gallbladder surgery  . Family history of adverse reaction to anesthesia    sister coded twice with anesthesai not sure of details sister still living  . Family history of breast cancer   . Family history of uterine cancer   . Headache    migraines  . Other and unspecified hyperlipidemia   . Unspecified essential hypertension    There were no vitals taken for this visit.  Opioid Risk Score:   Fall Risk Score:  `1  Depression screen PHQ 2/9  Depression screen [facility] 2/9 08/21/2018 05/21/2018 04/23/2018 04/04/2016 07/15/2015 12/14/2014 08/04/2014  Decreased Interest 0 0 0 0 1 0 0  Down,  Depressed, Hopeless 0 0 0 0 0 0 0  PHQ - 2 Score 0 0 0 0 1 0 0  Altered sleeping - - - - 1 - -  Tired, decreased energy - - - - 1 - -  Change in appetite - - - - 0 - -  Feeling bad or failure about yourself  - - - - 0 - -  Trouble concentrating - - - - 0 - -  Moving slowly or fidgety/restless - - - - 0 - -  Suicidal thoughts - - - - 0 - -  PHQ-9 Score - - - - 3 - -    Review of Systems  Musculoskeletal: Positive for back pain and gait problem.       Shoulder and leg pain  All other systems reviewed and are negative.      Objective:   Physical Exam        Assessment & Plan:

## 2020-06-03 ENCOUNTER — Encounter: Payer: Self-pay | Admitting: Registered Nurse

## 2020-06-03 ENCOUNTER — Encounter (HOSPITAL_BASED_OUTPATIENT_CLINIC_OR_DEPARTMENT_OTHER): Payer: 59 | Admitting: Registered Nurse

## 2020-06-03 ENCOUNTER — Other Ambulatory Visit: Payer: Self-pay

## 2020-06-03 VITALS — BP 144/79 | HR 68 | Ht 61.0 in | Wt 281.0 lb

## 2020-06-03 DIAGNOSIS — M7062 Trochanteric bursitis, left hip: Secondary | ICD-10-CM | POA: Diagnosis present

## 2020-06-03 DIAGNOSIS — M170 Bilateral primary osteoarthritis of knee: Secondary | ICD-10-CM

## 2020-06-03 DIAGNOSIS — M7061 Trochanteric bursitis, right hip: Secondary | ICD-10-CM | POA: Diagnosis present

## 2020-06-03 DIAGNOSIS — Z79891 Long term (current) use of opiate analgesic: Secondary | ICD-10-CM | POA: Diagnosis present

## 2020-06-03 DIAGNOSIS — G894 Chronic pain syndrome: Secondary | ICD-10-CM

## 2020-06-03 DIAGNOSIS — Z5181 Encounter for therapeutic drug level monitoring: Secondary | ICD-10-CM | POA: Diagnosis present

## 2020-06-03 MED ORDER — BELBUCA 450 MCG BU FILM
450.0000 ug | ORAL_FILM | Freq: Two times a day (BID) | BUCCAL | 2 refills | Status: DC
Start: 2020-06-03 — End: 2020-09-08

## 2020-06-03 NOTE — Progress Notes (Signed)
Subjective:    Patient ID: Anita Booker, female    DOB: June 24, 1960, 60 y.o.   MRN: [id number]  HPI : Anita Booker is a 60 y.o. female who returns for follow up appointment for chronic pain and medication refill. She states her pain is located in her bilateral hips and bilateral knees. She rates her pain 7. Her current exercise regime is walking short distances in her home with cane and riding her stationary bicycle for 20 minutes a day.   Last Oral Swab was Performed on 03/02/2020, it was consistent.     Pain Inventory Average Pain 6 Pain Right Now 7 My pain is sharp, burning, dull, stabbing and aching  In the last 24 hours, has pain interfered with the following? General activity 8 Relation with others 8 Enjoyment of life 8 What TIME of day is your pain at its worst? evening Sleep (in general) Fair  Pain is worse with: walking and standing Pain improves with: rest, heat/ice, therapy/exercise and medication Relief from Meds: ?  Family History  Problem Relation Age of Onset  . Lung cancer Father 67  . Cancer Father        lung  . Healthy Brother   . Uterine cancer Mother 24  . Heart attack Brother 48  . Cancer Paternal Grandmother        breast  . Breast cancer Sister 54       identical twin  . Lung cancer Paternal Uncle        all four uncles had lung cancer and were smokers  . Healthy Sister   . Breast cancer Other   . Breast cancer Cousin        paternal cousin dx in her 4s  . Liver cancer Cousin        paternal cousin with hepatitis and liver cancer   Social History   Socioeconomic History  . Marital status: Married    Spouse name: Not on file  . Number of children: Not on file  . Years of education: Not on file  . Highest education level: Not on file  Occupational History  . Not on file  Tobacco Use  . Smoking status: Never Smoker  . Smokeless tobacco: Never Used  Vaping Use  . Vaping Use: Never used  Substance and Sexual  Activity  . Alcohol use: No  . Drug use: No  . Sexual activity: Not on file  Other Topics Concern  . Not on file  Social History Narrative   Right handed   Social Determinants of Health   Financial Resource Strain: Not on file  Food Insecurity: Not on file  Transportation Needs: Not on file  Physical Activity: Not on file  Stress: Not on file  Social Connections: Not on file   Past Surgical History:  Procedure Laterality Date  . BREAST EXCISIONAL BIOPSY Right    infection to right breast 5 years ago.  Marland Kitchen BREAST SURGERY  07/01/2011.   I & D of right breast abscess  . CHOLECYSTECTOMY  1995  . COLONOSCOPY WITH PROPOFOL N/A 07/05/2016   Procedure: COLONOSCOPY WITH PROPOFOL;  Surgeon: Willis Modena, MD;  Location: [facility];  Service: Endoscopy;  Laterality: N/A;  . LEFT HEART CATH AND CORONARY ANGIOGRAPHY N/A 03/26/2017   Procedure: LEFT HEART CATH AND CORONARY ANGIOGRAPHY;  Surgeon: Yvonne Kendall, MD;  [facility]: [facility];  Service: Cardiovascular;  Laterality: N/A;   Past Surgical History:  Procedure Laterality Date  . BREAST  EXCISIONAL BIOPSY Right    infection to right breast 5 years ago.  Marland Kitchen BREAST SURGERY  07/01/2011.   I & D of right breast abscess  . CHOLECYSTECTOMY  1995  . COLONOSCOPY WITH PROPOFOL N/A 07/05/2016   Procedure: COLONOSCOPY WITH PROPOFOL;  Surgeon: Willis Modena, MD;  Location: [facility];  Service: Endoscopy;  Laterality: N/A;  . LEFT HEART CATH AND CORONARY ANGIOGRAPHY N/A 03/26/2017   Procedure: LEFT HEART CATH AND CORONARY ANGIOGRAPHY;  Surgeon: Yvonne Kendall, MD;  [facility]: [facility];  Service: Cardiovascular;  Laterality: N/A;   Past Medical History:  Diagnosis Date  . Arthritis   . Complication of anesthesia 1995   woke up on table right after gallbladder surgery  . Family history of adverse reaction to anesthesia    sister coded twice with anesthesai not sure of details sister still living  . Family history of breast  cancer   . Family history of uterine cancer   . Headache    migraines  . Other and unspecified hyperlipidemia   . Unspecified essential hypertension    There were no vitals taken for this visit.  Opioid Risk Score:   Fall Risk Score:  `1  Depression screen PHQ 2/9  Depression screen [facility] 2/9 08/21/2018 05/21/2018 04/23/2018 04/04/2016 07/15/2015 12/14/2014 08/04/2014  Decreased Interest 0 0 0 0 1 0 0  Down, Depressed, Hopeless 0 0 0 0 0 0 0  PHQ - 2 Score 0 0 0 0 1 0 0  Altered sleeping - - - - 1 - -  Tired, decreased energy - - - - 1 - -  Change in appetite - - - - 0 - -  Feeling bad or failure about yourself  - - - - 0 - -  Trouble concentrating - - - - 0 - -  Moving slowly or fidgety/restless - - - - 0 - -  Suicidal thoughts - - - - 0 - -  PHQ-9 Score - - - - 3 - -    Review of Systems  Constitutional: Negative.   HENT: Negative.   Eyes: Negative.   Respiratory: Negative.   Cardiovascular: Negative.   Gastrointestinal: Negative.   Endocrine: Negative.   Genitourinary: Negative.   Musculoskeletal: Positive for arthralgias and gait problem.  Skin: Negative.   Allergic/Immunologic: Negative.   Hematological: Negative.   Psychiatric/Behavioral: Negative.   All other systems reviewed and are negative.      Objective:   Physical Exam Vitals and nursing note reviewed.  Constitutional:      Appearance: Normal appearance.  Cardiovascular:     Rate and Rhythm: Normal rate and regular rhythm.     Pulses: Normal pulses.     Heart sounds: Normal heart sounds.  Pulmonary:     Effort: Pulmonary effort is normal.     Breath sounds: Normal breath sounds.  Musculoskeletal:     Cervical back: Normal range of motion and neck supple.     Comments: Normal Muscle Bulk and Muscle Testing Reveals:  Upper Extremities: Full ROM and Muscle Strength 5/5 Bilateral Greater Trochanter Tenderness Lower Extremities: Decreased ROM and Muscle Strength 5/5 Bilateral Lower Extremities Flexion  Produces Pain into her Bilateral Patella's Arrived in Motorized wheelchair  Skin:    General: Skin is warm and dry.  Neurological:     Mental Status: She is alert and oriented to person, place, and time.  Psychiatric:        Mood and Affect: Mood normal.  Behavior: Behavior normal.           Assessment & Plan:  1. 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 Continue HEP as Tolerated.06/03/2020 Refilled: [id number] MCG Filminside cheek every 12 hours #60. We will continue the opioid monitoring program, this consists of regular clinic visits, examinations, urine drug screen, pill counts as well as use of [place] Controlled Substance Reporting system. A 12 month History has been reviewed on the [place] Controlled Substance Reporting Systemon 06/03/2020. 2. Bilateral Hip osteoarthritis: Continue HEP as Tolerated. Continue to Monitor.06/03/2020 3. Bilateral Greater Trochanteric Tenderness: Continue to Alternate Ice and Heat Therapy. Continue to Monitor.06/03/2020 4. Migraines Witho Aura:  Neurology Following. Continue to Monitor.06/03/2020  F/U in 3 months

## 2020-07-25 ENCOUNTER — Other Ambulatory Visit: Payer: Self-pay | Admitting: Neurology

## 2020-08-19 ENCOUNTER — Other Ambulatory Visit: Payer: Self-pay | Admitting: Neurology

## 2020-09-08 ENCOUNTER — Encounter: Payer: 59 | Attending: Physical Medicine & Rehabilitation | Admitting: Registered Nurse

## 2020-09-08 ENCOUNTER — Other Ambulatory Visit: Payer: Self-pay

## 2020-09-08 VITALS — BP 157/84 | HR 75 | Temp 98.0°F

## 2020-09-08 DIAGNOSIS — M170 Bilateral primary osteoarthritis of knee: Secondary | ICD-10-CM | POA: Diagnosis not present

## 2020-09-08 DIAGNOSIS — Z5181 Encounter for therapeutic drug level monitoring: Secondary | ICD-10-CM | POA: Diagnosis not present

## 2020-09-08 DIAGNOSIS — Z79891 Long term (current) use of opiate analgesic: Secondary | ICD-10-CM | POA: Diagnosis not present

## 2020-09-08 DIAGNOSIS — M7061 Trochanteric bursitis, right hip: Secondary | ICD-10-CM | POA: Diagnosis present

## 2020-09-08 DIAGNOSIS — G894 Chronic pain syndrome: Secondary | ICD-10-CM | POA: Diagnosis not present

## 2020-09-08 DIAGNOSIS — M7062 Trochanteric bursitis, left hip: Secondary | ICD-10-CM | POA: Diagnosis present

## 2020-09-08 MED ORDER — BELBUCA 450 MCG BU FILM: 450.0000 ug | ORAL_FILM | Freq: Two times a day (BID) | BUCCAL | 2 refills | Status: DC

## 2020-09-08 NOTE — Progress Notes (Signed)
Subjective:    Patient ID: Anita Booker, female    DOB: 10/29/1960, 60 y.o.   MRN: [id number]  HPI: Anita Booker is a 60 y.o. female who returns for follow up appointment for chronic pain and medication refill. She states her pain is located in her bilateral hips and bilateral knees. She rates her pain 9. Her current exercise regime is walking short distances in her home with cane.   [name] Oral Swab was Performed today.    Pain Inventory Average Pain 8 Pain Right Now 9 My pain is sharp, burning, dull, and aching  In the last 24 hours, has pain interfered with the following? General activity 9 Relation with others 9 Enjoyment of life 9 What TIME of day is your pain at its worst? evening and night Sleep (in general) Poor  Pain is worse with: walking and standing Pain improves with: rest, heat/ice, therapy/exercise, and medication Relief from Meds: 2  Family History  Problem Relation Age of Onset   Lung cancer Father 67   Cancer Father        lung   Healthy Brother    Uterine cancer Mother 34   Heart attack Brother 29   Cancer Paternal Grandmother        breast   Breast cancer Sister 70       identical twin   Lung cancer Paternal Uncle        all four uncles had lung cancer and were smokers   Healthy Sister    Breast cancer Other    Breast cancer Cousin        paternal cousin dx in her 47s   Liver cancer Cousin        paternal cousin with hepatitis and liver cancer   Social History   Socioeconomic History   Marital status: Married    Spouse name: Not on file   Number of children: Not on file   Years of education: Not on file   Highest education level: Not on file  Occupational History   Not on file  Tobacco Use   Smoking status: Never   Smokeless tobacco: Never  Vaping Use   Vaping Use: Never used  Substance and Sexual Activity   Alcohol use: No   Drug use: No   Sexual activity: Not on file  Other Topics Concern   Not on file   Social History Narrative   Right handed   Social Determinants of Health   Financial Resource Strain: Not on file  Food Insecurity: Not on file  Transportation Needs: Not on file  Physical Activity: Not on file  Stress: Not on file  Social Connections: Not on file   Past Surgical History:  Procedure Laterality Date   BREAST EXCISIONAL BIOPSY Right    infection to right breast 5 years ago.   BREAST SURGERY  07/01/2011.   I & D of right breast abscess   CHOLECYSTECTOMY  1995   COLONOSCOPY WITH PROPOFOL N/A 07/05/2016   Procedure: COLONOSCOPY WITH PROPOFOL;  Surgeon: Willis Modena, MD;  Location: [facility];  Service: Endoscopy;  Laterality: N/A;   LEFT HEART CATH AND CORONARY ANGIOGRAPHY N/A 03/26/2017   Procedure: LEFT HEART CATH AND CORONARY ANGIOGRAPHY;  Surgeon: Yvonne Kendall, MD;  [facility]: [facility];  Service: Cardiovascular;  Laterality: N/A;   Past Surgical History:  Procedure Laterality Date   BREAST EXCISIONAL BIOPSY Right    infection to right breast 5 years ago.   BREAST  SURGERY  07/01/2011.   I & D of right breast abscess   CHOLECYSTECTOMY  1995   COLONOSCOPY WITH PROPOFOL N/A 07/05/2016   Procedure: COLONOSCOPY WITH PROPOFOL;  Surgeon: Willis Modena, MD;  Location: [facility];  Service: Endoscopy;  Laterality: N/A;   LEFT HEART CATH AND CORONARY ANGIOGRAPHY N/A 03/26/2017   Procedure: LEFT HEART CATH AND CORONARY ANGIOGRAPHY;  Surgeon: Yvonne Kendall, MD;  [facility]: [facility];  Service: Cardiovascular;  Laterality: N/A;   Past Medical History:  Diagnosis Date   Arthritis    Complication of anesthesia 1995   woke up on table right after gallbladder surgery   Family history of adverse reaction to anesthesia    sister coded twice with anesthesai not sure of details sister still living   Family history of breast cancer    Family history of uterine cancer    Headache    migraines   Other and unspecified hyperlipidemia    Unspecified  essential hypertension    BP (!) 157/84 (BP Location: Right Arm, Patient Position: Sitting, Cuff Size: Normal) Comment (BP Location): forearn  Pulse 75   Temp 98 F (36.7 C) (Oral)   SpO2 91%   Opioid Risk Score:   Fall Risk Score:  `1  Depression screen PHQ 2/9  Depression screen [facility] 2/9 08/21/2018 05/21/2018 04/23/2018 04/04/2016 07/15/2015 12/14/2014 08/04/2014  Decreased Interest 0 0 0 0 1 0 0  Down, Depressed, Hopeless 0 0 0 0 0 0 0  PHQ - 2 Score 0 0 0 0 1 0 0  Altered sleeping - - - - 1 - -  Tired, decreased energy - - - - 1 - -  Change in appetite - - - - 0 - -  Feeling bad or failure about yourself  - - - - 0 - -  Trouble concentrating - - - - 0 - -  Moving slowly or fidgety/restless - - - - 0 - -  Suicidal thoughts - - - - 0 - -  PHQ-9 Score - - - - 3 - -      Review of Systems  Respiratory:  Positive for cough, chest tightness, shortness of breath and wheezing.   Musculoskeletal:        Bilateral knee pain Bilateral hip pain  All other systems reviewed and are negative.     Objective:   Physical Exam Vitals and nursing note reviewed.  Constitutional:      Appearance: Normal appearance.  Cardiovascular:     Rate and Rhythm: Normal rate and regular rhythm.     Pulses: Normal pulses.     Heart sounds: Normal heart sounds.  Pulmonary:     Effort: Pulmonary effort is normal.     Breath sounds: Normal breath sounds.  Musculoskeletal:     Cervical back: Normal range of motion and neck supple.     Comments: Normal Muscle Bulk and Muscle Testing Reveals:  Upper Extremities: Full ROM and Muscle Strength 5/5  Bilateral Greater Trochanter Tenderness  Lower Extremities: Decreased ROM and Muscle Strength 4/4 Bilateral Lower Extremities Flexion Produces Pain into his Bilateral Patella's Arrived in Scooter    Skin:    General: Skin is warm and dry.  Neurological:     Mental Status: She is alert and oriented to person, place, and time.  Psychiatric:        Mood and  Affect: Mood normal.        Behavior: Behavior normal.  Assessment & Plan:  1. 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 Continue HEP as Tolerated. 09/08/2020 Refilled: [place] 450 MCG Film inside cheek every 12 hours #60.  We will continue the opioid monitoring program, this consists of regular clinic visits, examinations, urine drug screen, pill counts as well as use of [place] Controlled Substance Reporting system. A 12 month History has been reviewed on the [place] Controlled Substance Reporting System on 09/08/2020. 2. Bilateral Hip osteoarthritis: Continue HEP as Tolerated. Continue to Monitor. 09/08/2020  3. Bilateral Greater Trochanteric Tenderness: Continue to Alternate Ice and Heat Therapy. Continue to Monitor. 09/08/2020 4. Migraines Witho Aura:  Neurology Following. Continue to Monitor. 09/08/2020   F/U in 3 months

## 2020-09-12 ENCOUNTER — Encounter: Payer: Self-pay | Admitting: Registered Nurse

## 2020-09-17 NOTE — Progress Notes (Deleted)
NEUROLOGY FOLLOW UP OFFICE NOTE  Anita Booker [id number]  Assessment/Plan:   Chronic migraine without aura, without status migrainosus, not intractable  Migraine prevention:  *** Migraine rescue:  *** Limit use of pain relievers to no more than 2 days out of week to prevent risk of rebound or medication-overuse headache. 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  UPDATE: Started topiramate in December *** Intensity:  *** Duration:  *** Frequency:  *** Frequency of abortive medication: *** Current NSAIDS/analgesics:  ASA 81mg  daily, Celebrex, lidocaine 5% patch Current triptans:  none Current ergotamine:  none Current anti-emetic:  none Current muscle relaxants:  none Current Antihypertensive medications:  valsartan Current Antidepressant medications:  Nortriptyline 100mg  at bedtime, Wellbutrin SR 100mg  daily Current Anticonvulsant medications:  topiramate 50mg  QHS Current anti-CGRP: Ubrelvy 100mg  Current Vitamins/Herbal/Supplements:  MVI Current Antihistamines/Decongestants:  none Other therapy:  none Hormone/birth control:  none Other medications:  Ambien, Belbuca  Depression:  yes; Anxiety:  yes Other pain:  Chronic pain with OA in hips and history of prior fractures of the knees  HISTORY:  She has history of migraines since around 60 years old.  They are severe bilateral retro-orbital/frontal pressure pain with photophobia but no nausea, vomiting, phonophobia, visual disturbance.  They would last 2 hours with Fioricet.  They were well-maintained on nortriptyline, occurring only 1 or 2 times a year.  However, they increased in August after the loss of her son.  They are now occurring 2 to 3 days a week.  Her pharmacy will not fill her Fioricet because she is also taking Belbuca for chronic pain.     Past NSAIDS/analgesics:  Tylenol, tramadol Past abortive triptans:  none Past abortive ergotamine:  none Past muscle relaxants:  Flexeril Past anti-emetic:  none Past antihypertensive medications:  Lasix, irbesartan Past antidepressant medications:  none Past anticonvulsant medications:  none Past anti-CGRP:  none Past vitamins/Herbal/Supplements:  none Past antihistamines/decongestants:  none Other past therapies:  none    Prior imaging (Personally reviewed): 05/14/2014 MRI CERVICAL SPINE WO:  1. Mild cervical spondylosis and degenerative disc disease, causing mild impingement at C5-6 and borderline impingement at C4-5 as noted above. 2. Loss of the normal cervical lordosis, which can be associated with muscle spasm. 07/18/2013 CT HEAD WO:  No acute intracranial abnormality.  PAST MEDICAL HISTORY: Past Medical History:  Diagnosis Date   Arthritis    Complication of anesthesia 1995   woke up on table right after gallbladder surgery   Family history of adverse reaction to anesthesia    sister coded twice with anesthesai not sure of details sister still living   Family history of breast cancer    Family history of uterine cancer    Headache    migraines   Other and unspecified hyperlipidemia    Unspecified essential hypertension     MEDICATIONS: Current Outpatient Medications on File Prior to Visit  Medication Sig Dispense Refill   aspirin EC 81 MG tablet Take 81 mg by mouth daily.      atorvastatin (LIPITOR) 40 MG tablet TAKE 1 TABLET DAILY 90 tablet 0   Buprenorphine HCl (BELBUCA) 450 MCG FILM Place 1 Film (450 mcg total) inside cheek every 12 (twelve) hours. 60 Film 2   buPROPion (WELLBUTRIN SR) 100 MG 12 hr tablet Take 100 mg by mouth daily.  Calcium Carbonate (CALCIUM 600 PO) Take 1 tablet by mouth daily.     celecoxib (CELEBREX) 200 MG capsule Take 1 capsule (200 mg total) by mouth daily as needed. 90 capsule 3   cholecalciferol (VITAMIN D) 1000 units tablet Take  1,000 Units by mouth daily.     furosemide (LASIX) 20 MG tablet Take 20 mg by mouth daily.     hydrOXYzine (ATARAX/VISTARIL) 25 MG tablet Take 25 mg by mouth as needed.     lidocaine (LIDODERM) 5 % APPLY ON PATCH EACH KNEE DAILY REMOVE & DISCARD PATCH WITHIN 12 HOURS OR AS DIRECTED BY MD 60 patch 6   Multiple Vitamins-Minerals (MULTIVITAMIN WITH MINERALS) tablet Take 1 tablet by mouth daily.     nitroGLYCERIN (NITROSTAT) 0.4 MG SL tablet Place 1 tablet (0.4 mg total) under the tongue every 5 (five) minutes as needed for chest pain. 11 tablet 6   nortriptyline (PAMELOR) 50 MG capsule Take 100 mg by mouth at bedtime. Reported on 06/04/2015     valsartan (DIOVAN) 320 MG tablet Take 320 mg by mouth daily.  0   zolpidem (AMBIEN) 10 MG tablet Take 10 mg by mouth at bedtime as needed. Nightly     No current facility-administered medications on file prior to visit.    ALLERGIES: Allergies  Allergen Reactions   Butrans [Buprenorphine] Rash    Patch causes severe skin rash   Codeine Nausea Only    FAMILY HISTORY: Family History  Problem Relation Age of Onset   Lung cancer Father 77   Cancer Father        lung   Healthy Brother    Uterine cancer Mother 57   Heart attack Brother 3   Cancer Paternal Grandmother        breast   Breast cancer Sister 73       identical twin   Lung cancer Paternal Uncle        all four uncles had lung cancer and were smokers   Healthy Sister    Breast cancer Other    Breast cancer Cousin        paternal cousin dx in her 55s   Liver cancer Cousin        paternal cousin with hepatitis and liver cancer      Objective:  *** General: No acute distress.  Patient appears ***-groomed.   Head:  Normocephalic/atraumatic Eyes:  Fundi examined but not visualized Neck: supple, no paraspinal tenderness, full range of motion Heart:  Regular rate and rhythm Lungs:  Clear to auscultation bilaterally Back: No paraspinal tenderness Neurological Exam: alert and  oriented to person, place, and time.  Speech fluent and not dysarthric, language intact.  CN II-XII intact. Bulk and tone normal, muscle strength 5/5 throughout.  Sensation to light touch intact.  Deep tendon reflexes 2+ throughout, toes downgoing.  Finger to nose testing intact.  Gait normal, Romberg negative.   Shon Millet, DO  CC: Marinda Elk, MD

## 2020-09-20 LAB — DRUG TOX MONITOR 1 W/CONF, ORAL FLD
Amobarbital: NEGATIVE ng/mL (ref <10)
Amphetamines: NEGATIVE ng/mL (ref <10)
Barbiturates: NEGATIVE ng/mL (ref <10)
Benzodiazepines: NEGATIVE ng/mL (ref <0.50)
Buprenorphine: NEGATIVE ng/mL (ref <0.10)
Cocaine: NEGATIVE ng/mL (ref <5.0)
Fentanyl: NEGATIVE ng/mL (ref <0.10)
Heroin Metabolite: NEGATIVE ng/mL (ref <1.0)
MARIJUANA: NEGATIVE ng/mL (ref <2.5)
MDMA: NEGATIVE ng/mL (ref <10)
Meprobamate: NEGATIVE ng/mL (ref <2.5)
Methadone: NEGATIVE ng/mL (ref <5.0)
Naloxone: NEGATIVE ng/mL (ref <0.25)
Nicotine Metabolite: NEGATIVE ng/mL (ref <5.0)
Norbuprenorphine: NEGATIVE ng/mL (ref <0.50)
Opiates: NEGATIVE ng/mL (ref <2.5)
Pentobarbital: NEGATIVE ng/mL (ref <10)
Phencyclidine: NEGATIVE ng/mL (ref <10)
Phenobarbital: NEGATIVE ng/mL (ref <10)
Secobarbital: NEGATIVE ng/mL (ref <10)
Tapentadol: NEGATIVE ng/mL (ref <5.0)
Tramadol: NEGATIVE ng/mL (ref <5.0)
Zolpidem: 6.3 ng/mL — ABNORMAL HIGH (ref <5.0)
Zolpidem: POSITIVE ng/mL — AB (ref <5.0)

## 2020-09-20 LAB — DRUG TOX ALC METAB W/CON, ORAL FLD: Alcohol Metabolite: NEGATIVE ng/mL (ref <25)

## 2020-09-21 ENCOUNTER — Ambulatory Visit: Payer: 59 | Admitting: Neurology

## 2020-09-24 ENCOUNTER — Telehealth: Payer: Self-pay | Admitting: *Deleted

## 2020-09-24 NOTE — Telephone Encounter (Signed)
Oral swab was negative for buprenorphine. It is not clear when she applied her last patch by documentation. Prescribed Zolpidem is present.

## 2020-10-22 ENCOUNTER — Other Ambulatory Visit: Payer: Self-pay | Admitting: Obstetrics and Gynecology

## 2020-10-22 DIAGNOSIS — Z1231 Encounter for screening mammogram for malignant neoplasm of breast: Secondary | ICD-10-CM

## 2020-11-26 ENCOUNTER — Other Ambulatory Visit (HOSPITAL_BASED_OUTPATIENT_CLINIC_OR_DEPARTMENT_OTHER): Payer: Self-pay

## 2020-11-26 DIAGNOSIS — G4733 Obstructive sleep apnea (adult) (pediatric): Secondary | ICD-10-CM

## 2020-11-26 DIAGNOSIS — G4734 Idiopathic sleep related nonobstructive alveolar hypoventilation: Secondary | ICD-10-CM

## 2020-12-01 ENCOUNTER — Encounter: Payer: 59 | Admitting: Registered Nurse

## 2020-12-02 ENCOUNTER — Telehealth: Payer: Self-pay | Admitting: Registered Nurse

## 2020-12-02 MED ORDER — BELBUCA 450 MCG BU FILM: 450.0000 ug | ORAL_FILM | Freq: Two times a day (BID) | BUCCAL | 2 refills | Status: DC

## 2020-12-02 NOTE — Telephone Encounter (Signed)
Patient called stating she had COVID positive test on 12/01/20. She missed her appt yesterday.  I scheduled her appt for 12/22/20 but she will be out of her medications 12/03/20.

## 2020-12-02 NOTE — Telephone Encounter (Signed)
PMP was Reviewed. She picked up her Belbucca on 11/18/2020.  Her scheduled appointment was changed due to recent  + COVID. Belbucca prescription sent for October.  Placed a call to [name], no answer, left message to return the call.

## 2020-12-03 ENCOUNTER — Telehealth: Payer: Self-pay

## 2020-12-03 NOTE — Telephone Encounter (Signed)
Return Anita Booker call, she states she was exhausted when she  told  the front desk her butrans patch count. She has medications.

## 2020-12-03 NOTE — Telephone Encounter (Signed)
Anita Booker is calling to speak to [name] she said she has a miss call from [place] and is calling back .

## 2020-12-09 ENCOUNTER — Encounter (HOSPITAL_BASED_OUTPATIENT_CLINIC_OR_DEPARTMENT_OTHER): Payer: 59 | Admitting: Internal Medicine

## 2020-12-21 ENCOUNTER — Ambulatory Visit
Admission: RE | Admit: 2020-12-21 | Discharge: 2020-12-21 | Disposition: A | Discharge status: Home / Self Care | Payer: 59 | Source: Ambulatory Visit | Attending: Obstetrics and Gynecology | Admitting: Obstetrics and Gynecology

## 2020-12-21 ENCOUNTER — Other Ambulatory Visit: Payer: Self-pay

## 2020-12-21 DIAGNOSIS — Z1231 Encounter for screening mammogram for malignant neoplasm of breast: Secondary | ICD-10-CM

## 2020-12-22 ENCOUNTER — Ambulatory Visit: Payer: 59 | Admitting: Registered Nurse

## 2020-12-30 ENCOUNTER — Encounter: Payer: Self-pay | Admitting: Registered Nurse

## 2020-12-30 ENCOUNTER — Encounter: Payer: 59 | Attending: Registered Nurse | Admitting: Registered Nurse

## 2020-12-30 ENCOUNTER — Other Ambulatory Visit: Payer: Self-pay

## 2020-12-30 VITALS — BP 131/78 | HR 63 | Temp 98.4°F

## 2020-12-30 DIAGNOSIS — Z79891 Long term (current) use of opiate analgesic: Secondary | ICD-10-CM

## 2020-12-30 DIAGNOSIS — M7061 Trochanteric bursitis, right hip: Secondary | ICD-10-CM | POA: Diagnosis present

## 2020-12-30 DIAGNOSIS — G894 Chronic pain syndrome: Secondary | ICD-10-CM

## 2020-12-30 DIAGNOSIS — Z5181 Encounter for therapeutic drug level monitoring: Secondary | ICD-10-CM | POA: Diagnosis present

## 2020-12-30 DIAGNOSIS — M170 Bilateral primary osteoarthritis of knee: Secondary | ICD-10-CM | POA: Diagnosis present

## 2020-12-30 DIAGNOSIS — M7062 Trochanteric bursitis, left hip: Secondary | ICD-10-CM

## 2020-12-30 MED ORDER — BELBUCA 450 MCG BU FILM: 450.0000 ug | ORAL_FILM | Freq: Two times a day (BID) | BUCCAL | 2 refills | Status: DC

## 2020-12-30 NOTE — Progress Notes (Signed)
Subjective:    Patient ID: Anita Booker, female    DOB: 1960-12-11, 60 y.o.   MRN: [id number]  HPI: Anita Booker is a 60 y.o. female who returns for follow up appointment for chronic pain and medication refill. She states her pain is located in her bilateral hips and bilateral knees. She rates her pain 6. Her current exercise regime is walking short distances with her cane  and performing stretching exercises.     Oral Swab was Performed today.      Pain Inventory Average Pain 7 Pain Right Now 6 My pain is sharp, burning, tingling, and aching  In the last 24 hours, has pain interfered with the following? General activity 9 Relation with others 9 Enjoyment of life 9 What TIME of day is your pain at its worst? night Sleep (in general) Poor  Pain is worse with: walking and standing Pain improves with: medication Relief from Meds: 4  Family History  Problem Relation Age of Onset   Lung cancer Father 75   Cancer Father        lung   Healthy Brother    Uterine cancer Mother 17   Heart attack Brother 33   Cancer Paternal Grandmother        breast   Breast cancer Sister 31       identical twin   Lung cancer Paternal Uncle        all four uncles had lung cancer and were smokers   Healthy Sister    Breast cancer Other    Breast cancer Cousin        paternal cousin dx in her 32s   Liver cancer Cousin        paternal cousin with hepatitis and liver cancer   Social History   Socioeconomic History   Marital status: Married    Spouse name: Not on file   Number of children: Not on file   Years of education: Not on file   Highest education level: Not on file  Occupational History   Not on file  Tobacco Use   Smoking status: Never   Smokeless tobacco: Never  Vaping Use   Vaping Use: Never used  Substance and Sexual Activity   Alcohol use: No   Drug use: No   Sexual activity: Not on file  Other Topics Concern   Not on file  Social History  Narrative   Right handed   Social Determinants of Health   Financial Resource Strain: Not on file  Food Insecurity: Not on file  Transportation Needs: Not on file  Physical Activity: Not on file  Stress: Not on file  Social Connections: Not on file   Past Surgical History:  Procedure Laterality Date   BREAST EXCISIONAL BIOPSY Right    infection to right breast 5 years ago.   BREAST SURGERY  07/01/2011.   I & D of right breast abscess   CHOLECYSTECTOMY  1995   COLONOSCOPY WITH PROPOFOL N/A 07/05/2016   Procedure: COLONOSCOPY WITH PROPOFOL;  Surgeon: Willis Modena, MD;  Location: [facility];  Service: Endoscopy;  Laterality: N/A;   LEFT HEART CATH AND CORONARY ANGIOGRAPHY N/A 03/26/2017   Procedure: LEFT HEART CATH AND CORONARY ANGIOGRAPHY;  Surgeon: Yvonne Kendall, MD;  Location: [facility];  Service: Cardiovascular;  Laterality: N/A;   Past Surgical History:  Procedure Laterality Date   BREAST EXCISIONAL BIOPSY Right    infection to right breast 5 years ago.  BREAST SURGERY  07/01/2011.   I & D of right breast abscess   CHOLECYSTECTOMY  1995   COLONOSCOPY WITH PROPOFOL N/A 07/05/2016   Procedure: COLONOSCOPY WITH PROPOFOL;  Surgeon: Willis Modena, MD;  Location: [facility];  Service: Endoscopy;  Laterality: N/A;   LEFT HEART CATH AND CORONARY ANGIOGRAPHY N/A 03/26/2017   Procedure: LEFT HEART CATH AND CORONARY ANGIOGRAPHY;  Surgeon: Yvonne Kendall, MD;  Location: [facility];  Service: Cardiovascular;  Laterality: N/A;   Past Medical History:  Diagnosis Date   Arthritis    Complication of anesthesia 1995   woke up on table right after gallbladder surgery   Family history of adverse reaction to anesthesia    sister coded twice with anesthesai not sure of details sister still living   Family history of breast cancer    Family history of uterine cancer    Headache    migraines   Other and unspecified hyperlipidemia    Unspecified essential hypertension     Temp 98.4 F (36.9 C)   Opioid Risk Score:   Fall Risk Score:  `1  Depression screen PHQ 2/9  Depression screen [facility] 2/9 08/21/2018 05/21/2018 04/23/2018 04/04/2016 07/15/2015 12/14/2014 08/04/2014  Decreased Interest 0 0 0 0 1 0 0  Down, Depressed, Hopeless 0 0 0 0 0 0 0  PHQ - 2 Score 0 0 0 0 1 0 0  Altered sleeping - - - - 1 - -  Tired, decreased energy - - - - 1 - -  Change in appetite - - - - 0 - -  Feeling bad or failure about yourself  - - - - 0 - -  Trouble concentrating - - - - 0 - -  Moving slowly or fidgety/restless - - - - 0 - -  Suicidal thoughts - - - - 0 - -  PHQ-9 Score - - - - 3 - -    Review of Systems  Constitutional: Negative.   HENT: Negative.    Eyes: Negative.   Respiratory: Negative.    Cardiovascular: Negative.   Gastrointestinal: Negative.   Endocrine: Negative.   Genitourinary: Negative.   Musculoskeletal: Negative.   Skin: Negative.   Allergic/Immunologic: Negative.   Neurological: Negative.   Hematological: Negative.   Psychiatric/Behavioral: Negative.        Objective:   Physical Exam Vitals and nursing note reviewed.  Constitutional:      Appearance: Normal appearance.  Cardiovascular:     Rate and Rhythm: Normal rate and regular rhythm.     Pulses: Normal pulses.     Heart sounds: Normal heart sounds.  Pulmonary:     Effort: Pulmonary effort is normal.     Breath sounds: Normal breath sounds.  Musculoskeletal:     Cervical back: Normal range of motion and neck supple.     Comments: Normal Muscle Bulk and Muscle Testing Reveals:  Upper Extremities: Full ROM and Muscle Strength 5/5 Bilateral Greater Trochanter Tenderness Lower Extremities: Decreased ROM and Muscle Strength 4/5 Bilateral Lower Extremities Flexion Produces Pain into her Bilateral Hips and Bilateral knees Arrived in her scooter     Skin:    General: Skin is warm and dry.  Neurological:     Mental Status: She is alert and oriented to person, place, and time.   Psychiatric:        Mood and Affect: Mood normal.        Behavior: Behavior normal.         Assessment &  Plan:  1. 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 Continue HEP as Tolerated. 12/30/2020 Refilled: Belbuca 450 MCG Film inside cheek every 12 hours #60.  We will continue the opioid monitoring program, this consists of regular clinic visits, examinations, urine drug screen, pill counts as well as use of [place] Controlled Substance Reporting system. A 12 month History has been reviewed on the [place] Controlled Substance Reporting System on 12/30/2020. 2. Bilateral Hip osteoarthritis: Continue HEP as Tolerated. Continue to Monitor. 12/30/2020  3. Bilateral Greater Trochanteric Tenderness: Continue to Alternate Ice and Heat Therapy. Continue to Monitor. 12/30/2020 4. Migraines Without Aura:  Neurology Following. Continue to Monitor. 12/30/2020   F/U in 3 months

## 2021-01-05 LAB — DRUG TOX MONITOR 1 W/CONF, ORAL FLD
Amphetamines: NEGATIVE ng/mL (ref <10)
Barbiturates: NEGATIVE ng/mL (ref <10)
Benzodiazepines: NEGATIVE ng/mL (ref <0.50)
Buprenorphine: >25 ng/mL — ABNORMAL HIGH (ref <0.10)
Buprenorphine: POSITIVE ng/mL — AB (ref <0.10)
Cocaine: NEGATIVE ng/mL (ref <5.0)
Fentanyl: NEGATIVE ng/mL (ref <0.10)
Heroin Metabolite: NEGATIVE ng/mL (ref <1.0)
MARIJUANA: NEGATIVE ng/mL (ref <2.5)
MDMA: NEGATIVE ng/mL (ref <10)
Meprobamate: NEGATIVE ng/mL (ref <2.5)
Methadone: NEGATIVE ng/mL (ref <5.0)
Naloxone: NEGATIVE ng/mL (ref <0.25)
Nicotine Metabolite: NEGATIVE ng/mL (ref <5.0)
Norbuprenorphine: 1.24 ng/mL — ABNORMAL HIGH (ref <0.50)
Opiates: NEGATIVE ng/mL (ref <2.5)
Phencyclidine: NEGATIVE ng/mL (ref <10)
Tapentadol: NEGATIVE ng/mL (ref <5.0)
Tramadol: NEGATIVE ng/mL (ref <5.0)
Zolpidem: NEGATIVE ng/mL (ref <5.0)

## 2021-01-05 LAB — DRUG TOX ALC METAB W/CON, ORAL FLD: Alcohol Metabolite: NEGATIVE ng/mL (ref <25)

## 2021-01-11 ENCOUNTER — Telehealth: Payer: Self-pay | Admitting: *Deleted

## 2021-01-11 NOTE — Telephone Encounter (Signed)
Oral swab drug screen was consistent for prescribed medications.  ?

## 2021-01-18 ENCOUNTER — Ambulatory Visit (INDEPENDENT_AMBULATORY_CARE_PROVIDER_SITE_OTHER): Payer: 59 | Admitting: Cardiology

## 2021-01-18 ENCOUNTER — Other Ambulatory Visit: Payer: Self-pay

## 2021-01-18 VITALS — BP 132/70 | HR 86 | Ht 61.0 in | Wt 336.0 lb

## 2021-01-18 DIAGNOSIS — E782 Mixed hyperlipidemia: Secondary | ICD-10-CM | POA: Diagnosis not present

## 2021-01-18 DIAGNOSIS — M1990 Unspecified osteoarthritis, unspecified site: Secondary | ICD-10-CM | POA: Insufficient documentation

## 2021-01-18 DIAGNOSIS — R011 Cardiac murmur, unspecified: Secondary | ICD-10-CM | POA: Diagnosis not present

## 2021-01-18 DIAGNOSIS — I739 Peripheral vascular disease, unspecified: Secondary | ICD-10-CM

## 2021-01-18 DIAGNOSIS — I10 Essential (primary) hypertension: Secondary | ICD-10-CM

## 2021-01-18 DIAGNOSIS — Z8489 Family history of other specified conditions: Secondary | ICD-10-CM | POA: Insufficient documentation

## 2021-01-18 DIAGNOSIS — R519 Headache, unspecified: Secondary | ICD-10-CM | POA: Insufficient documentation

## 2021-01-18 NOTE — Progress Notes (Signed)
Cardiology Office Note:    Date:  01/18/2021   ID:  Anita Booker, DOB 02-16-1961, MRN [id number]  PCP:  Briscoe Deutscher, MD  Cardiologist:  Jenean Lindau, MD   Referring MD: Briscoe Deutscher, MD    ASSESSMENT:    1. Murmur   2. Claudication in peripheral vascular disease ([place])   3. Essential hypertension   4. Mixed dyslipidemia   5. Morbid obesity (H[facility])   6. Cardiac murmur   7. Intermittent claudication (H[facility])    PLAN:    In order of problems listed above:  Primary prevention stressed with the patient.  Importance of compliance with diet and medication stressed and she vocalized understanding. Mixed dyslipidemia: On lipid-lowering therapy managed by primary care.  Patient is on aspirin also and again this is something that is managed by primary care. Bilateral pedal edema: This is mild in nature and we will have an echocardiogram to assess left ventricular systolic function. Intermittent claudication: History is not very accurate.  She is morbidly obese and leads a very sedentary lifestyle.  Peripheral pulses are barely felt at therefore I will do a ankle brachial index to assess this. Morbid obesity: Lifestyle modification urged. Risks of obesity explained and she promises to do better. Patient will be seen in follow-up appointment in 6 months or earlier if the patient has any concerns    Medication Adjustments/Labs and Tests Ordered: Current medicines are reviewed at length with the patient today.  Concerns regarding medicines are outlined above.  Orders Placed This Encounter  Procedures   EKG 12-Lead   ECHOCARDIOGRAM COMPLETE   VAS [place] ABI WITH/WO TBI   No orders of the defined types were placed in this encounter.    History of Present Illness:    Anita Booker is a 60 y.o. female who is being seen today for the evaluation of bilateral pedal edema and pain in legs on ambulation at the request of Briscoe Deutscher, MD. patient is a 60 year old  female.  She has past medical history of morbid obesity with a BMI greater than 63.  She has history of mixed dyslipidemia.  Her primary care provider has her on aspirin for unclear reasons.  She denies any chest pain orthopnea or PND.  She tells me that she has some bilateral pedal edema.  She also mentions to me that when she tries to do some stuff she has pain in both feet and wants to be evaluated.  At the time of my evaluation, the patient is alert awake oriented and in no distress.  Past Medical History:  Diagnosis Date   Abnormal nuclear cardiac imaging test 03/23/2017   Arthritis    Bilateral primary osteoarthritis of knee 06/05/2016   Cellulitis and abscess of trunk 04/30/2012   Chest pain 02/13/2017   Chronic pain of left knee 10/31/2011   Significant medial compartment DJD bilaterally. Both knees had steroid injection done on October 31, 2011   Chronic pain syndrome 04/29/4707   Complication of anesthesia 1995   woke up on table right after gallbladder surgery   Corneal ulcer 08/06/2012   Essential hypertension 02/13/2017   Family history of adverse reaction to anesthesia    sister coded twice with anesthesai not sure of details sister still living   Family history of breast cancer    Family history of uterine cancer    Genetic testing 09/07/2016   Negative genetic testing on the Common Hereditary cancer panel.  The Hereditary Gene Panel offered by  [facility] includes sequencing and/or deletion duplication testing of the following 46 genes: APC, ATM, AXIN2, BARD1, BMPR1A, BRCA1, BRCA2, BRIP1, CDH1, CDKN2A (p14ARF), CDKN2A (p16INK4a), CHEK2, CTNNA1, DICER1, EPCAM (Deletion/duplication testing only), GREM1 (promoter region deletion/duplication te   Headache    migraines   Mixed dyslipidemia 02/13/2017   Morbid obesity ([place]) 02/13/2017    Past Surgical History:  Procedure Laterality Date   BREAST EXCISIONAL BIOPSY Right    infection to right breast 5 years ago.   BREAST SURGERY   07/01/2011.   I & D of right breast abscess   CHOLECYSTECTOMY  1995   COLONOSCOPY WITH PROPOFOL N/A 07/05/2016   Procedure: COLONOSCOPY WITH PROPOFOL;  Surgeon: Arta Silence, MD;  Location: [facility];  Service: Endoscopy;  Laterality: N/A;   LEFT HEART CATH AND CORONARY ANGIOGRAPHY N/A 03/26/2017   Procedure: LEFT HEART CATH AND CORONARY ANGIOGRAPHY;  Surgeon: Nelva Bush, MD;  Location: [facility];  Service: Cardiovascular;  Laterality: N/A;    Current Medications: Current Meds  Medication Sig   albuterol (VENTOLIN HFA) 108 (90 Base) MCG/ACT inhaler Inhale 1-2 puffs into the lungs as needed for wheezing or shortness of breath.   aspirin EC 81 MG tablet Take 81 mg by mouth daily.    atorvastatin (LIPITOR) 40 MG tablet Take 40 mg by mouth daily.   Buprenorphine HCl (BELBUCA) 450 MCG FILM Place 1 Film (450 mcg total) inside cheek every 12 (twelve) hours. Do not Fill Before 01/21/2021   buPROPion (WELLBUTRIN SR) 100 MG 12 hr tablet Take 100 mg by mouth daily.   Calcium Carbonate (CALCIUM 600 PO) Take 1 tablet by mouth daily.   celecoxib (CELEBREX) 200 MG capsule Take 200 mg by mouth daily as needed for pain.   cholecalciferol (VITAMIN D) 1000 units tablet Take 1,000 Units by mouth daily.   estradiol (ESTRACE) 0.1 MG/GM vaginal cream Place 1 Applicatorful vaginally daily.   furosemide (LASIX) 20 MG tablet Take 20 mg by mouth daily.   hydrOXYzine (ATARAX/VISTARIL) 25 MG tablet Take 25 mg by mouth as needed for dizziness.   Multiple Vitamins-Minerals (MULTIVITAMIN WITH MINERALS) tablet Take 1 tablet by mouth daily.   nitroGLYCERIN (NITROSTAT) 0.4 MG SL tablet Place 0.4 mg under the tongue every 5 (five) minutes as needed for chest pain.   nortriptyline (PAMELOR) 50 MG capsule Take 100 mg by mouth at bedtime. Reported on 06/04/2015   phentermine (ADIPEX-P) 37.5 MG tablet Take 37.5 mg by mouth every morning.   valsartan (DIOVAN) 320 MG tablet Take 320 mg by mouth daily.   zolpidem  (AMBIEN) 5 MG tablet Take 5 mg by mouth at bedtime as needed for sleep.     Allergies:   Butrans [buprenorphine] and Codeine   Social History   Socioeconomic History   Marital status: Married    Spouse name: Not on file   Number of children: Not on file   Years of education: Not on file   Highest education level: Not on file  Occupational History   Not on file  Tobacco Use   Smoking status: Never   Smokeless tobacco: Never  Vaping Use   Vaping Use: Never used  Substance and Sexual Activity   Alcohol use: No   Drug use: No   Sexual activity: Not on file  Other Topics Concern   Not on file  Social History Narrative   Right handed   Social Determinants of Health   Financial Resource Strain: Not on file  Food Insecurity: Not on file  Transportation Needs: Not on file  Physical Activity: Not on file  Stress: Not on file  Social Connections: Not on file     Family History: The patient's family history includes Breast cancer in her cousin and another family member; Breast cancer (age of onset: 91) in her sister; Cancer in her father and paternal grandmother; Healthy in her brother and sister; Heart attack (age of onset: 3) in her brother; Liver cancer in her cousin; Lung cancer in her paternal uncle; Lung cancer (age of onset: 68) in her father; Uterine cancer (age of onset: 14) in her mother.  ROS:   Please see the history of present illness.    All other systems reviewed and are negative.  EKGs/Labs/Other Studies Reviewed:    The following studies were reviewed today: EKG reveals sinus rhythm with nonspecific ST-T changes   Recent Labs: No results found for requested labs within last 8760 hours.  Recent Lipid Panel    Component Value Date/Time   CHOL 149 04/11/2011 0849   TRIG 83.0 04/11/2011 0849   HDL 45.10 04/11/2011 0849   CHOLHDL 3 04/11/2011 0849   VLDL 16.6 04/11/2011 0849   LDLCALC 87 04/11/2011 0849    Physical Exam:    VS:  BP 132/70   Pulse  86   Ht 5' 1"  (1.549 m)   Wt (!) 336 lb (152.4 kg)   BMI 63.49 kg/m     Wt Readings from Last 3 Encounters:  01/18/21 (!) 336 lb (152.4 kg)  06/03/20 281 lb (127.5 kg)  03/17/20 281 lb (127.5 kg)     GEN: Patient is in no acute distress HEENT: Normal NECK: No JVD; No carotid bruits LYMPHATICS: No lymphadenopathy CARDIAC: S1 S2 regular, 2/6 systolic murmur at the apex. RESPIRATORY:  Clear to auscultation without rales, wheezing or rhonchi  ABDOMEN: Soft, non-tender, non-distended MUSCULOSKELETAL:  No edema; No deformity  SKIN: Warm and dry NEUROLOGIC:  Alert and oriented x 3 PSYCHIATRIC:  Normal affect    Signed, Jenean Lindau, MD  01/18/2021 1:59 PM    [facility]

## 2021-01-18 NOTE — Patient Instructions (Signed)
Medication Instructions:  Your physician recommends that you continue on your current medications as directed. Please refer to the Current Medication list given to you today.  *If you need a refill on your cardiac medications before your next appointment, please call your pharmacy*   Lab Work: None ordered If you have labs (blood work) drawn today and your tests are completely normal, you will receive your results only by: MyChart Message (if you have MyChart) OR A paper copy in the mail If you have any lab test that is abnormal or we need to change your treatment, we will call you to review the results.   Testing/Procedures: Your physician has requested that you have an echocardiogram. Echocardiography is a painless test that uses sound waves to create images of your heart. It provides your doctor with information about the size and shape of your heart and how well your heart's chambers and valves are working. This procedure takes approximately one hour. There are no restrictions for this procedure.  Your physician has requested that you have an ankle brachial index (ABI). During this test an ultrasound and blood pressure cuff are used to evaluate the arteries that supply the arms and legs with blood. Allow thirty minutes for this exam. There are no restrictions or special instructions.   Follow-Up: At [facility], you and your health needs are our priority.  As part of our continuing mission to provide you with exceptional heart care, we have created designated Provider Care Teams.  These Care Teams include your primary Cardiologist (physician) and Advanced Practice Providers (APPs -  Physician Assistants and Nurse Practitioners) who all work together to provide you with the care you need, when you need it.  We recommend signing up for the patient portal called "MyChart".  Sign up information is provided on this After Visit Summary.  MyChart is used to connect with patients for Virtual  Visits (Telemedicine).  Patients are able to view lab/test results, encounter notes, upcoming appointments, etc.  Non-urgent messages can be sent to your provider as well.   To learn more about what you can do with MyChart, go to [email or web address].    Your next appointment:   12 month(s)  The format for your next appointment:   In Person  Provider:   Belva Crome, MD   Other Instructions Echocardiogram An echocardiogram is a test that uses sound waves (ultrasound) to produce images of the heart. Images from an echocardiogram can provide important information about: Heart size and shape. The size and thickness and movement of your heart's walls. Heart muscle function and strength. Heart valve function or if you have stenosis. Stenosis is when the heart valves are too narrow. If blood is flowing backward through the heart valves (regurgitation). A tumor or infectious growth around the heart valves. Areas of heart muscle that are not working well because of poor blood flow or injury from a heart attack. Aneurysm detection. An aneurysm is a weak or damaged part of an artery wall. The wall bulges out from the normal force of blood pumping through the body. Tell a health care provider about: Any allergies you have. All medicines you are taking, including vitamins, herbs, eye drops, creams, and over-the-counter medicines. Any blood disorders you have. Any surgeries you have had. Any medical conditions you have. Whether you are pregnant or may be pregnant. What are the risks? Generally, this is a safe test. However, problems may occur, including an allergic reaction to dye (contrast) that may be used  during the test. What happens before the test? No specific preparation is needed. You may eat and drink normally. What happens during the test? You will take off your clothes from the waist up and put on a hospital gown. Electrodes or electrocardiogram (ECG)patches may be placed on  your chest. The electrodes or patches are then connected to a device that monitors your heart rate and rhythm. You will lie down on a table for an ultrasound exam. A gel will be applied to your chest to help sound waves pass through your skin. A handheld device, called a transducer, will be pressed against your chest and moved over your heart. The transducer produces sound waves that travel to your heart and bounce back (or "echo" back) to the transducer. These sound waves will be captured in real-time and changed into images of your heart that can be viewed on a video monitor. The images will be recorded on a computer and reviewed by your health care provider. You may be asked to change positions or hold your breath for a short time. This makes it easier to get different views or better views of your heart. In some cases, you may receive contrast through an IV in one of your veins. This can improve the quality of the pictures from your heart. The procedure may vary among health care providers and hospitals.   What can I expect after the test? You may return to your normal, everyday life, including diet, activities, and medicines, unless your health care provider tells you not to do that. Follow these instructions at home: It is up to you to get the results of your test. Ask your health care provider, or the department that is doing the test, when your results will be ready. Keep all follow-up visits. This is important. Summary An echocardiogram is a test that uses sound waves (ultrasound) to produce images of the heart. Images from an echocardiogram can provide important information about the size and shape of your heart, heart muscle function, heart valve function, and other possible heart problems. You do not need to do anything to prepare before this test. You may eat and drink normally. After the echocardiogram is completed, you may return to your normal, everyday life, unless your health care  provider tells you not to do that. This information is not intended to replace advice given to you by your health care provider. Make sure you discuss any questions you have with your health care provider. Document Revised: 10/28/2019 Document Reviewed: 10/28/2019 Elsevier Patient Education  2021 Elsevier Inc.  Ankle-Brachial Index Test Why am I having this test? The ankle-brachial index (ABI) test is used to diagnose peripheral vascular disease (PVD). PVD is also known as peripheral arterial disease (PAD). PVD is the blocking or hardening of the arteries anywhere within the circulatory system beyond the heart. It is a form of cardiovascular disease. PVD is caused by: Cholesterol deposits in your blood vessels (atherosclerosis). This is the most common cause of this condition. Inflammation in the blood vessels. Blood clots in the vessels. Cholesterol deposits cause arteries to narrow. Decreased delivery of oxygen to your tissues causes muscle pain, cramping, and fatigue that occurs during exercise and is relieved by rest. This is called intermittent claudication. PVD means that there may also be buildup of cholesterol: In your heart. This raises the risk of heart attacks. In your brain. This raises the risk of strokes. What is being tested? The ankle-brachial index test measures the blood flow in  your arms and legs. The blood flow will show if blood vessels in your legs have been narrowed by cholesterol deposits. How do I prepare for this test? Wear loose, comfortable clothing with shoes that are easy to remove. Do not use any products that contain nicotine or tobacco for at least 30 minutes before the test. These products include cigarettes, chewing tobacco, and vaping devices, such as e-cigarettes. Avoid caffeine, alcohol, and heavy activity an hour before the test. What happens during the test?  You will lie down in a resting position with your legs and arms at heart level. Your health  care provider will use a blood pressure machine and a small ultrasound device (Doppler) to measure the systolic pressures on your upper arms and ankles. Systolic pressure is the pressure inside your arteries when your heart pumps. Systolic pressures will be taken several times, and at several points, on both the ankle and the arm. Your health care provider may also measure your toe. Your health care provider will divide the highest systolic pressure of the ankle by the highest systolic pressure of the arm. The result is the ankle-brachial pressure ratio, or ABI. Sometimes this test will be repeated after you have exercised on a treadmill for 5 minutes. You may have leg pain during the exercise portion of the test if you suffer from PVD. If the index number drops after exercise, this may show that PVD is present. How are the results reported? Your test results will be reported as a value that shows the ratio of your ankle pressure to your arm pressure (ABI ratio). Your health care provider will compare your results to normal ranges that were established after testing a large group of people (reference ranges). These ranges may vary among labs and hospitals. For this test, an abnormal reading is typically considered, which is an ABI ratio of 0.9 or less. What do the results mean? An ABI ratio that is below the reference range is considered abnormal and may indicate PVD in the legs. Talk with your health care provider about what your results mean. Questions to ask your health care provider Ask your health care provider, or the department that is doing the test: When will my results be ready? How will I get my results? What are my treatment options? What other tests do I need? What are my next steps? Summary The ankle-brachial index (ABI) test is used to diagnose peripheral vascular disease (PVD). PVD is also known as peripheral arterial disease (PAD). The ABI test measures the blood flow in your arms  and legs. The highest systolic pressure of the ankle is divided by the highest systolic pressure of the arm. The result is the ABI ratio. An ABI ratio of 0.9 or less is considered abnormal and may indicate PVD in the legs. This information is not intended to replace advice given to you by your health care provider. Make sure you discuss any questions you have with your health care provider. Document Revised: 09/08/2019 Document Reviewed: 09/08/2019 Elsevier Patient Education  2022 [employer].

## 2021-01-18 NOTE — Progress Notes (Deleted)
Cardiology Office Note:    Date:  01/18/2021   ID:  Anita Booker, DOB Jul 29, 1960, MRN [id number]  PCP:  Briscoe Deutscher, MD  Cardiologist:  Jenean Lindau, MD   Referring MD: Briscoe Deutscher, MD    ASSESSMENT:    No diagnosis found. PLAN:    In order of problems listed above:  ***   Medication Adjustments/Labs and Tests Ordered: Current medicines are reviewed at length with the patient today.  Concerns regarding medicines are outlined above.  No orders of the defined types were placed in this encounter.  No orders of the defined types were placed in this encounter.    History of Present Illness:    Anita Booker is a 60 y.o. female who is being seen today for the evaluation of *** at the request of Briscoe Deutscher, MD. ***  Past Medical History:  Diagnosis Date   Abnormal nuclear cardiac imaging test 03/23/2017   Arthritis    Bilateral primary osteoarthritis of knee 06/05/2016   Cellulitis and abscess of trunk 04/30/2012   Chest pain 02/13/2017   Chronic pain of left knee 10/31/2011   Significant medial compartment DJD bilaterally. Both knees had steroid injection done on October 31, 2011   Chronic pain syndrome 7/93/9030   Complication of anesthesia 1995   woke up on table right after gallbladder surgery   Corneal ulcer 08/06/2012   Essential hypertension 02/13/2017   Family history of adverse reaction to anesthesia    sister coded twice with anesthesai not sure of details sister still living   Family history of breast cancer    Family history of uterine cancer    Genetic testing 09/07/2016   Negative genetic testing on the Common Hereditary cancer panel.  The Hereditary Gene Panel offered by [facility] includes sequencing and/or deletion duplication testing of the following 46 genes: APC, ATM, AXIN2, BARD1, BMPR1A, BRCA1, BRCA2, BRIP1, CDH1, CDKN2A (p14ARF), CDKN2A (p16INK4a), CHEK2, CTNNA1, DICER1, EPCAM (Deletion/duplication testing only), GREM1  (promoter region deletion/duplication te   Headache    migraines   Mixed dyslipidemia 02/13/2017   Morbid obesity ([place]) 02/13/2017    Past Surgical History:  Procedure Laterality Date   BREAST EXCISIONAL BIOPSY Right    infection to right breast 5 years ago.   BREAST SURGERY  07/01/2011.   I & D of right breast abscess   CHOLECYSTECTOMY  1995   COLONOSCOPY WITH PROPOFOL N/A 07/05/2016   Procedure: COLONOSCOPY WITH PROPOFOL;  Surgeon: Arta Silence, MD;  Location: [facility];  Service: Endoscopy;  Laterality: N/A;   LEFT HEART CATH AND CORONARY ANGIOGRAPHY N/A 03/26/2017   Procedure: LEFT HEART CATH AND CORONARY ANGIOGRAPHY;  Surgeon: Nelva Bush, MD;  Location: [facility];  Service: Cardiovascular;  Laterality: N/A;    Current Medications: Current Meds  Medication Sig   albuterol (VENTOLIN HFA) 108 (90 Base) MCG/ACT inhaler Inhale 1-2 puffs into the lungs as needed for wheezing or shortness of breath.   aspirin EC 81 MG tablet Take 81 mg by mouth daily.    atorvastatin (LIPITOR) 40 MG tablet Take 40 mg by mouth daily.   Buprenorphine HCl (BELBUCA) 450 MCG FILM Place 1 Film (450 mcg total) inside cheek every 12 (twelve) hours. Do not Fill Before 01/21/2021   buPROPion (WELLBUTRIN SR) 100 MG 12 hr tablet Take 100 mg by mouth daily.   Calcium Carbonate (CALCIUM 600 PO) Take 1 tablet by mouth daily.   celecoxib (CELEBREX) 200 MG capsule Take 200 mg by  mouth daily as needed for pain.   cholecalciferol (VITAMIN D) 1000 units tablet Take 1,000 Units by mouth daily.   estradiol (ESTRACE) 0.1 MG/GM vaginal cream Place 1 Applicatorful vaginally daily.   furosemide (LASIX) 20 MG tablet Take 20 mg by mouth daily.   hydrOXYzine (ATARAX/VISTARIL) 25 MG tablet Take 25 mg by mouth as needed for dizziness.   Multiple Vitamins-Minerals (MULTIVITAMIN WITH MINERALS) tablet Take 1 tablet by mouth daily.   nitroGLYCERIN (NITROSTAT) 0.4 MG SL tablet Place 0.4 mg under the tongue every 5  (five) minutes as needed for chest pain.   nortriptyline (PAMELOR) 50 MG capsule Take 100 mg by mouth at bedtime. Reported on 06/04/2015   phentermine (ADIPEX-P) 37.5 MG tablet Take 37.5 mg by mouth every morning.   valsartan (DIOVAN) 320 MG tablet Take 320 mg by mouth daily.   zolpidem (AMBIEN) 5 MG tablet Take 5 mg by mouth at bedtime as needed for sleep.     Allergies:   Butrans [buprenorphine] and Codeine   Social History   Socioeconomic History   Marital status: Married    Spouse name: Not on file   Number of children: Not on file   Years of education: Not on file   Highest education level: Not on file  Occupational History   Not on file  Tobacco Use   Smoking status: Never   Smokeless tobacco: Never  Vaping Use   Vaping Use: Never used  Substance and Sexual Activity   Alcohol use: No   Drug use: No   Sexual activity: Not on file  Other Topics Concern   Not on file  Social History Narrative   Right handed   Social Determinants of Health   Financial Resource Strain: Not on file  Food Insecurity: Not on file  Transportation Needs: Not on file  Physical Activity: Not on file  Stress: Not on file  Social Connections: Not on file     Family History: The patient's family history includes Breast cancer in her cousin and another family member; Breast cancer (age of onset: 52) in her sister; Cancer in her father and paternal grandmother; Healthy in her brother and sister; Heart attack (age of onset: 25) in her brother; Liver cancer in her cousin; Lung cancer in her paternal uncle; Lung cancer (age of onset: 73) in her father; Uterine cancer (age of onset: 34) in her mother.  ROS:   Please see the history of present illness.    All other systems reviewed and are negative.  EKGs/Labs/Other Studies Reviewed:    The following studies were reviewed today: EFT HEART CATH AND CORONARY ANGIOGRAPHY   Conclusion  Conclusions: No angiographically significant coronary artery  disease.  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 Upper normal to mildly elevated LVEDP with hyperdynamic left ventricular contraction.   Recommendations: Medical therapy and evaluation for alternative causes of the patient's chest pain.  I do not believe that the small, anomalous atrial branch explains her symptoms.   Nelva Bush, MD [facility] Pager: [phone]   Recent Labs: No results found for requested labs within last 8760 hours.  Recent Lipid Panel    Component Value Date/Time   CHOL 149 04/11/2011 0849   TRIG 83.0 04/11/2011 0849   HDL 45.10 04/11/2011 0849   CHOLHDL 3 04/11/2011 0849   VLDL 16.6 04/11/2011 0849   LDLCALC 87 04/11/2011 0849    Physical Exam:    VS:  BP 132/70   Pulse 86   Ht  5' 1"  (1.549 m)   Wt (!) 336 lb (152.4 kg)   BMI 63.49 kg/m     Wt Readings from Last 3 Encounters:  01/18/21 (!) 336 lb (152.4 kg)  06/03/20 281 lb (127.5 kg)  03/17/20 281 lb (127.5 kg)     GEN: Patient is in no acute distress HEENT: Normal NECK: No JVD; No carotid bruits LYMPHATICS: No lymphadenopathy CARDIAC: S1 S2 regular, 2/6 systolic murmur at the apex. RESPIRATORY:  Clear to auscultation without rales, wheezing or rhonchi  ABDOMEN: Soft, non-tender, non-distended MUSCULOSKELETAL:  No edema; No deformity  SKIN: Warm and dry NEUROLOGIC:  Alert and oriented x 3 PSYCHIATRIC:  Normal affect    Signed, Jenean Lindau, MD  01/18/2021 1:45 PM    [facility]

## 2021-01-24 ENCOUNTER — Ambulatory Visit (HOSPITAL_BASED_OUTPATIENT_CLINIC_OR_DEPARTMENT_OTHER): Payer: 59 | Attending: Internal Medicine | Admitting: Internal Medicine

## 2021-01-24 ENCOUNTER — Other Ambulatory Visit: Payer: Self-pay

## 2021-01-24 DIAGNOSIS — G4733 Obstructive sleep apnea (adult) (pediatric): Secondary | ICD-10-CM | POA: Diagnosis present

## 2021-01-24 DIAGNOSIS — G4734 Idiopathic sleep related nonobstructive alveolar hypoventilation: Secondary | ICD-10-CM

## 2021-02-08 DIAGNOSIS — Z8616 Personal history of COVID-19: Secondary | ICD-10-CM | POA: Insufficient documentation

## 2021-02-08 DIAGNOSIS — R053 Chronic cough: Secondary | ICD-10-CM | POA: Insufficient documentation

## 2021-02-08 DIAGNOSIS — Z8701 Personal history of pneumonia (recurrent): Secondary | ICD-10-CM | POA: Insufficient documentation

## 2021-02-14 ENCOUNTER — Other Ambulatory Visit: Payer: Self-pay

## 2021-02-14 ENCOUNTER — Ambulatory Visit (HOSPITAL_BASED_OUTPATIENT_CLINIC_OR_DEPARTMENT_OTHER)
Admission: RE | Admit: 2021-02-14 | Discharge: 2021-02-14 | Disposition: A | Discharge status: Home / Self Care | Payer: 59 | Source: Ambulatory Visit | Attending: Cardiology | Admitting: Cardiology

## 2021-02-14 DIAGNOSIS — R011 Cardiac murmur, unspecified: Secondary | ICD-10-CM | POA: Insufficient documentation

## 2021-02-14 DIAGNOSIS — I739 Peripheral vascular disease, unspecified: Secondary | ICD-10-CM | POA: Insufficient documentation

## 2021-02-15 LAB — ECHOCARDIOGRAM COMPLETE
AR max vel: 3.02 cm2
AV Area VTI: 3.21 cm2
AV Area mean vel: 2.8 cm2
AV Mean grad: 7 mmHg
AV Peak grad: 12.3 mmHg
Ao pk vel: 1.75 m/s
Area-P 1/2: 3.77 cm2
Calc EF: 57.6 %
S' Lateral: 3.2 cm
Single Plane A2C EF: 59.2 %
Single Plane A4C EF: 59.3 %

## 2021-02-15 NOTE — Procedures (Signed)
   NAME: Anita Booker DATE OF BIRTH:  [birth year] MEDICAL RECORD NUMBER [id number]  LOCATION: [facility]  PHYSICIAN: Deretha Emory  DATE OF STUDY: 01/24/2021  SLEEP STUDY TYPE: Positive Airway Pressure Titration               REFERRING PHYSICIAN: Deretha Emory, MD  EPWORTH SLEEPINESS SCORE:  4 HEIGHT: 5\' 1"  (154.9 cm)  WEIGHT: (!) 336 lb (152.4 kg)    Body mass index is 63.49 kg/m.  NECK SIZE: 17 in.  CLINICAL INFORMATION The patient was referred to the sleep center for PAP titration. A recent HSAT showed severe OSA (AHI 93/hr) with severe hypoxemia (O2 min 50%; 257 min < 90%).  MEDICATIONS Patient self administered medications include: Ambien 5 mg at 09:45:40 PM.  SLEEP STUDY TECHNIQUE The patient underwent an attended overnight polysomnography titration to assess the effects of cpap therapy. The following variables were monitored: EEG (C4-A1, C3-A2, O1-A2, O2-A1, F3-M2, F4-M1), EOG, submental and leg EMG, ECG, oxyhemoglobin saturation by pulse oximetry, thoracic and abdominal respiratory effort belts, nasal/oral airflow by pressure sensor, body position sensor and snoring sensor. CPAP pressure was titrated to eliminate apneas, hypopneas and oxygen desaturation.  TECHNICAL COMMENTS Comments added by Technician: pt went to restroom 2 time. Patient had difficulty initiating sleep. Patient sleep in a recliner chair. Comments added by Scorer: N/A  SLEEP ARCHITECTURE The study was initiated at 10:02:46 PM and terminated at 4:34:11 AM. Total recorded time was 391.4 minutes. EEG confirmed total sleep time was 294 minutes yielding a sleep efficiency of 75.1%. Sleep onset after lights out was 69.3 minutes with a REM latency of 16.0 minutes. The patient spent 2.4% of the night in stage N1 sleep, 20.1% in stage N2 sleep, 16.7% in stage N3 and 60.9% in REM. The Arousal Index was 15.1/hour.  RESPIRATORY PARAMETERS The overall AHI was 12.9 per hour, and the  RDI was 17.1 events/hour with a central apnea index of 0per hour. The most appropriate setting of BiPAP was IPAP/EPAP 27/23 cm H2O. At this setting the patient slept for 45 minutes. At optimal pressure, the sleep efficiency was 100%, the patient was supine for 0%, the AHI was 0 events per hour, the RDI was 0 events/hour (with 0 central events), the arousal index was 5.3 per hour, and the oxygen nadir was 87.0%   LEG MOVEMENT DATA The total leg movements were 0 with a resulting leg movement index of 0.0. Associated arousal with leg movement index was 0.0.  CARDIAC DATA The underlying cardiac rhythm was most consistent with sinus rhythm. Mean heart rate during sleep was 63.6 bpm. Additional rhythm abnormalities include None.  IMPRESSIONS - Severe Obstructive Sleep Apnea (OSA) Optimal pressure attained.  DIAGNOSIS - Obstructive Sleep Apnea (G47.33)  RECOMMENDATIONS - Trial of BiPAP therapy on 27/23 cm H2O with a Small size [employer] Respironics Full Face Mask Amara View mask and heated humidification.  01-06-1976 Sleep specialist, American Board of Internal Medicine  ELECTRONICALLY SIGNED ON:  02/15/2021, 8:26 PM [facility] PH: [phone]   FX: [phone] ACCREDITED BY THE AMERICAN ACADEMY OF SLEEP MEDICINE

## 2021-03-10 DIAGNOSIS — Z0289 Encounter for other administrative examinations: Secondary | ICD-10-CM

## 2021-04-05 ENCOUNTER — Encounter (INDEPENDENT_AMBULATORY_CARE_PROVIDER_SITE_OTHER): Payer: Self-pay | Admitting: Family Medicine

## 2021-04-05 ENCOUNTER — Ambulatory Visit (INDEPENDENT_AMBULATORY_CARE_PROVIDER_SITE_OTHER): Payer: 59 | Admitting: Family Medicine

## 2021-04-05 ENCOUNTER — Other Ambulatory Visit: Payer: Self-pay

## 2021-04-05 VITALS — BP 137/74 | HR 68 | Temp 97.6°F | Ht <= 58 in | Wt 345.0 lb

## 2021-04-05 DIAGNOSIS — R600 Localized edema: Secondary | ICD-10-CM | POA: Insufficient documentation

## 2021-04-05 DIAGNOSIS — R5383 Other fatigue: Secondary | ICD-10-CM

## 2021-04-05 DIAGNOSIS — Z1211 Encounter for screening for malignant neoplasm of colon: Secondary | ICD-10-CM | POA: Insufficient documentation

## 2021-04-05 DIAGNOSIS — E559 Vitamin D deficiency, unspecified: Secondary | ICD-10-CM

## 2021-04-05 DIAGNOSIS — G4733 Obstructive sleep apnea (adult) (pediatric): Secondary | ICD-10-CM

## 2021-04-05 DIAGNOSIS — Z1331 Encounter for screening for depression: Secondary | ICD-10-CM | POA: Diagnosis not present

## 2021-04-05 DIAGNOSIS — I10 Essential (primary) hypertension: Secondary | ICD-10-CM

## 2021-04-05 DIAGNOSIS — R0602 Shortness of breath: Secondary | ICD-10-CM | POA: Diagnosis not present

## 2021-04-05 DIAGNOSIS — E7849 Other hyperlipidemia: Secondary | ICD-10-CM | POA: Diagnosis not present

## 2021-04-05 DIAGNOSIS — I5189 Other ill-defined heart diseases: Secondary | ICD-10-CM | POA: Diagnosis not present

## 2021-04-05 DIAGNOSIS — G44229 Chronic tension-type headache, not intractable: Secondary | ICD-10-CM | POA: Insufficient documentation

## 2021-04-05 DIAGNOSIS — G4700 Insomnia, unspecified: Secondary | ICD-10-CM | POA: Insufficient documentation

## 2021-04-05 DIAGNOSIS — Z596 Low income: Secondary | ICD-10-CM

## 2021-04-05 DIAGNOSIS — G4734 Idiopathic sleep related nonobstructive alveolar hypoventilation: Secondary | ICD-10-CM | POA: Insufficient documentation

## 2021-04-05 DIAGNOSIS — N3281 Overactive bladder: Secondary | ICD-10-CM

## 2021-04-05 DIAGNOSIS — G479 Sleep disorder, unspecified: Secondary | ICD-10-CM | POA: Insufficient documentation

## 2021-04-05 DIAGNOSIS — Z8371 Family history of colonic polyps: Secondary | ICD-10-CM | POA: Insufficient documentation

## 2021-04-05 DIAGNOSIS — G8929 Other chronic pain: Secondary | ICD-10-CM

## 2021-04-05 DIAGNOSIS — G43909 Migraine, unspecified, not intractable, without status migrainosus: Secondary | ICD-10-CM

## 2021-04-05 DIAGNOSIS — M179 Osteoarthritis of knee, unspecified: Secondary | ICD-10-CM | POA: Insufficient documentation

## 2021-04-05 DIAGNOSIS — G4709 Other insomnia: Secondary | ICD-10-CM

## 2021-04-05 DIAGNOSIS — Z6841 Body Mass Index (BMI) 40.0 and over, adult: Secondary | ICD-10-CM

## 2021-04-05 DIAGNOSIS — J329 Chronic sinusitis, unspecified: Secondary | ICD-10-CM | POA: Insufficient documentation

## 2021-04-05 DIAGNOSIS — E7800 Pure hypercholesterolemia, unspecified: Secondary | ICD-10-CM | POA: Insufficient documentation

## 2021-04-05 DIAGNOSIS — E669 Obesity, unspecified: Secondary | ICD-10-CM

## 2021-04-05 NOTE — Progress Notes (Signed)
Dear Anita Cory, NP,   Thank you for referring Anita Booker to our clinic. The following note includes my evaluation and treatment recommendations.  Chief Complaint:   OBESITY Anita Booker (MR# [id number]) is a 61 y.o. female who presents for evaluation and treatment of obesity and related comorbidities. Current BMI is Body mass index is 72.11 kg/m. Anita Booker has been struggling with her weight for many years and has been unsuccessful in either losing weight, maintaining weight loss, or reaching her healthy weight goal.  Anita Booker is currently in the action stage of change and ready to dedicate time achieving and maintaining a healthier weight. Anita Booker is interested in becoming our patient and working on intensive lifestyle modifications including (but not limited to) diet and exercise for weight loss.  Anita Booker uses a scooter, wheelchair.  She has severe OSA and uses a BiPAP.  She endorses chronic pain.  She has a history of taking phentermine (not working anymore).  She says that it takes her hours to get up, get showered, and get downstairs.  For breakfast, she will have Special K cereal with milk.  Around 2-3 she will have a soup/sandwich.  She likes chicken, steak, or ribs, and vegetables for dinner.  She will have a snack at night with her medications.   Anita Booker's habits were reviewed today and are as follows: Her family eats meals together, she thinks her family will eat healthier with her, her desired weight loss is 172 pounds, she has been heavy most of her life, she started gaining weight after having children, her heaviest weight ever was her current weight, she craves sweets, she snacks frequently in the evenings, and she struggles with emotional eating.  Depression Screen Anita Booker's Food and Mood (modified PHQ-9) score was 12.  Depression screen [facility] 2/9 04/05/2021  Decreased Interest 3  Down, Depressed, Hopeless 1  PHQ - 2 Score 4  Altered sleeping 3   Tired, decreased energy 3  Change in appetite 1  Feeling bad or failure about yourself  1  Trouble concentrating 0  Moving slowly or fidgety/restless 0  Suicidal thoughts 0  PHQ-9 Score 12  Difficult doing work/chores Very difficult   Assessment/Plan:   1. Other fatigue Anita Booker denies daytime somnolence and reports waking up still tired. Patent has a history of symptoms of morning fatigue and snoring. Anita Booker generally gets 4 hours of sleep per night, and states that she has poor quality sleep - on BiPAP. Snoring is present. Apneic episodes are present. Epworth Sleepiness Score is 4.  Anita Booker does feel that her weight is causing her energy to be lower than it should be. Fatigue may be related to obesity, depression or many other causes. Labs will be ordered, and in the meanwhile, Anita Booker will focus on self care including making healthy food choices, increasing physical activity and focusing on stress reduction.  2. SOBOE (shortness of breath on exertion) Anita Booker notes increasing shortness of breath with exercising and seems to be worsening over time with weight gain. She notes getting out of breath sooner with activity than she used to. This has gotten worse recently. Anita Booker denies shortness of breath at rest or orthopnea.    Anita Booker does feel that she gets out of breath more easily that she used to when she exercises. Anita Booker's shortness of breath appears to be obesity related and exercise induced. She has agreed to work on weight loss and gradually increase exercise to treat her exercise induced shortness of breath.  Will continue to monitor closely.  RMR today seems high.  We will monitor intake to see if calories meet this.  3. Diastolic dysfunction, grade 1 She is followed by Cardiology.  We will follow along as it pertains to her weight loss journey.  4. Chronic pain Leg/knee bilaterally.  Uses wheelchair.  Followed by Pain Management.  She is taking buprenorphine, Celebrex, and nortriptyline.    5. Other hyperlipidemia Course: Controlled. Lipid-lowering medications: Lipitor 40 mg dailiy.   Plan: The current medical regimen is effective;  continue present plan and medications.  Lab Results  Component Value Date   CHOL 149 04/11/2011   HDL 45.10 04/11/2011   LDLCALC 87 04/11/2011   TRIG 83.0 04/11/2011   CHOLHDL 3 04/11/2011   Lab Results  Component Value Date   ALT 21 04/11/2011   AST 19 04/11/2011   ALKPHOS 70 04/11/2011   BILITOT 0.9 04/11/2011   6. Trouble affording medications Due to poor med coverage.  She will always pay at least 80% cost. She would benefit greatly from a GLP1RA. We will work on helping her get this at a reasonable cost.  7. Essential hypertension At goal. Medications: valsartan 320 mg daily, Lasix 20 mg daily as needed.   Plan: Avoid buying foods that are: processed, frozen, or prepackaged to avoid excess salt. We will watch for signs of hypotension as she continues lifestyle modifications.  BP Readings from Last 3 Encounters:  04/05/21 137/74  01/18/21 132/70  12/30/20 131/78   Lab Results  Component Value Date   CREATININE 0.63 03/23/2017   8. Nonintractable chronic migraine [name] takes nortriptyline 100 mg at bedtime for migraine prevention.  9. OSA treated with BiPAP, severe She uses a BiPAP for OSA.  OSA is a cause of systemic hypertension and is associated with an increased incidence of stroke, heart failure, atrial fibrillation, and coronary heart disease. Severe OSA increases all-cause mortality and cardiovascular mortality.   Goal: Treatment of OSA via CPAP compliance and weight loss. Plasma ghrelin levels (appetite or "hunger hormone") are significantly higher in OSA patients than in BMI-matched controls, but decrease to levels similar to those of obese patients without OSA after CPAP treatment.  Weight loss improves OSA by several mechanisms, including reduction in fatty tissue in the throat (i.e. parapharyngeal fat) and  the tongue. Loss of abdominal fat increases mediastinal traction on the upper airway making it less likely to collapse during sleep. Studies have also shown that compliance with CPAP treatment improves leptin (hunger inhibitory hormone) imbalance.  10. Other insomnia This is poorly controlled. Dysfunction: early morning awakening. Average hours of sleep per night: 4-6. Current treatment: Ambien 5 mg.    Plan: Consider longer-acting medication.  11. Vitamin D deficiency She is taking OTC vitamin D 1000 IU daily.  Plan: Continue current OTC vitamin D supplementation.  Follow-up for routine testing of Vitamin D, at least 2-3 times per year to avoid over-replacement.  12. Overactive bladder Taking Anita Booker 50 mg daily.  13. Depression screening Anita Booker was screened for depression as part of her new patient workup today.  PHQ-9 is 12.  Anita Booker had a positive depression screening. Depression is commonly associated with obesity and often results in emotional eating behaviors. We will monitor this closely and work on CBT to help improve the non-hunger eating patterns. Referral to Psychology may be required if no improvement is seen as she continues in our clinic.  14. Obesity, current BMI 72.11  Anita Booker is currently in the action stage of  change and her goal is to continue with weight loss efforts. I recommend Anita Booker begin the structured treatment plan as follows:  She has agreed to journal.  Exercise goals: No exercise has been prescribed at this time.   Behavioral modification strategies: increasing lean protein intake, decreasing simple carbohydrates, increasing vegetables, and increasing water intake.  She was informed of the importance of frequent follow-up visits to maximize her success with intensive lifestyle modifications for her multiple health conditions. She was informed we would discuss her lab results at her next visit unless there is a critical issue that needs to be addressed sooner.  Anita Booker agreed to keep her next visit at the agreed upon time to discuss these results.  Objective:   Blood pressure 137/74, pulse 68, temperature 97.6 F (36.4 C), height 4\' 10"  (1.473 m), weight (!) 345 lb (156.5 kg), SpO2 98 %. Body mass index is 72.11 kg/m.  Indirect Calorimeter completed today shows a VO2 of 359 and a REE of 2477.    General: Cooperative, alert, well developed, in no acute distress. HEENT: Conjunctivae and lids unremarkable. Cardiovascular: Regular rhythm.  Lungs: Normal work of breathing. Neurologic: No focal deficits.   Lab Results  Component Value Date   CREATININE 0.63 03/23/2017   BUN 12 03/23/2017   NA 141 03/23/2017   K 3.7 03/23/2017   CL 102 03/23/2017   CO2 31 03/23/2017   Lab Results  Component Value Date   ALT 21 04/11/2011   AST 19 04/11/2011   ALKPHOS 70 04/11/2011   BILITOT 0.9 04/11/2011   Lab Results  Component Value Date   HGBA1C 5.6 01/25/2011   HGBA1C 5.7 12/15/2009   HGBA1C 5.2 10/19/2006   Lab Results  Component Value Date   TSH 1.37 01/25/2011   Lab Results  Component Value Date   CHOL 149 04/11/2011   HDL 45.10 04/11/2011   LDLCALC 87 04/11/2011   TRIG 83.0 04/11/2011   CHOLHDL 3 04/11/2011   Lab Results  Component Value Date   WBC 6.9 03/23/2017   HGB 13.8 03/23/2017   HCT 40.6 03/23/2017   MCV 84.9 03/23/2017   PLT 331 03/23/2017   Attestation Statements:   This is the patient's first visit at [facility]. The patient's NEW PATIENT PACKET was reviewed at length. Included in the packet: current and past health history, medications, allergies, ROS, gynecologic history (women only), surgical history, family history, social history, weight history, weight loss surgery history (for those that have had weight loss surgery), nutritional evaluation, mood and food questionnaire, PHQ9, Epworth questionnaire, sleep habits questionnaire, patient life and health improvement goals questionnaire. These will all  be scanned into the patient's chart under media.   During the visit, I independently reviewed the patient's EKG, bioimpedance scale results, and indirect calorimeter results. I used this information to tailor a meal plan for the patient that will help her to lose weight and will improve her obesity-related conditions going forward. I performed a medically necessary appropriate examination and/or evaluation. I discussed the assessment and treatment plan with the patient. The patient was provided an opportunity to ask questions and all were answered. The patient agreed with the plan and demonstrated an understanding of the instructions. Labs were ordered at this visit and will be reviewed at the next visit unless more critical results need to be addressed immediately. Clinical information was updated and documented in the EMR.   I, Insurance claims handlerAmber Agner, CMA, am acting as transcriptionist for Helane RimaErica Carrick Rijos, DO  I  have reviewed the above documentation for accuracy and completeness, and I agree with the above. - Helane RimaErica Ely Spragg, DO, MS, FAAFP, DABOM - Family and Bariatric Medicine.

## 2021-04-07 ENCOUNTER — Encounter (INDEPENDENT_AMBULATORY_CARE_PROVIDER_SITE_OTHER): Payer: Self-pay | Admitting: Family Medicine

## 2021-04-07 DIAGNOSIS — R7301 Impaired fasting glucose: Secondary | ICD-10-CM

## 2021-04-07 DIAGNOSIS — Q249 Congenital malformation of heart, unspecified: Secondary | ICD-10-CM

## 2021-04-11 MED ORDER — OZEMPIC (0.25 OR 0.5 MG/DOSE) 2 MG/1.5ML ~~LOC~~ SOPN: 0.2500 mg | PEN_INJECTOR | SUBCUTANEOUS | 0 refills | Status: DC

## 2021-04-12 ENCOUNTER — Encounter: Payer: 59 | Attending: Registered Nurse | Admitting: Registered Nurse

## 2021-04-12 ENCOUNTER — Encounter: Payer: Self-pay | Admitting: Registered Nurse

## 2021-04-12 ENCOUNTER — Other Ambulatory Visit: Payer: Self-pay

## 2021-04-12 VITALS — BP 139/80 | HR 77 | Temp 97.7°F | Ht <= 58 in | Wt 345.0 lb

## 2021-04-12 DIAGNOSIS — M7061 Trochanteric bursitis, right hip: Secondary | ICD-10-CM | POA: Insufficient documentation

## 2021-04-12 DIAGNOSIS — Z5181 Encounter for therapeutic drug level monitoring: Secondary | ICD-10-CM | POA: Diagnosis present

## 2021-04-12 DIAGNOSIS — M170 Bilateral primary osteoarthritis of knee: Secondary | ICD-10-CM | POA: Diagnosis present

## 2021-04-12 DIAGNOSIS — Z79891 Long term (current) use of opiate analgesic: Secondary | ICD-10-CM | POA: Insufficient documentation

## 2021-04-12 DIAGNOSIS — G894 Chronic pain syndrome: Secondary | ICD-10-CM | POA: Insufficient documentation

## 2021-04-12 DIAGNOSIS — M7062 Trochanteric bursitis, left hip: Secondary | ICD-10-CM | POA: Diagnosis present

## 2021-04-12 MED ORDER — BELBUCA 450 MCG BU FILM: 450.0000 ug | ORAL_FILM | Freq: Two times a day (BID) | BUCCAL | 2 refills | Status: DC

## 2021-04-12 NOTE — Progress Notes (Signed)
Subjective:    Patient ID: Anita Booker, female    DOB: 13-Sep-1960, 61 y.o.   MRN: [id number]  HPI: Anita Booker is a 61 y.o. female who returns for follow up appointment for chronic pain and medication refill. She states her  pain is located in her bilateral hips and bilateral knee. She rates her pain 6. Her current exercise regime is walking short distances with a cane  and performing stretching exercises.  Last Oral Swab  was Performed on 12/30/2020, it was consistent.       Pain Inventory Average Pain 7 Pain Right Now 6 My pain is constant, sharp, burning, dull, and aching  In the last 24 hours, has pain interfered with the following? General activity 7 Relation with others 7 Enjoyment of life 7 What TIME of day is your pain at its worst? evening Sleep (in general) Fair  Pain is worse with: walking, bending, standing, and some activites Pain improves with: rest, heat/ice, and medication Relief from Meds:  not sure  Family History  Problem Relation Age of Onset   Cancer Mother    Thyroid disease Mother    Stroke Mother    Hypertension Mother    Diabetes Mother    Uterine cancer Mother 63   Obesity Mother    Obesity Father    Hypertension Father    Lung cancer Father 65   Cancer Father        lung   Breast cancer Sister 62       identical twin   Healthy Sister    Healthy Brother    Heart attack Brother 27   Cancer Paternal Grandmother        breast   Lung cancer Paternal Uncle        all four uncles had lung cancer and were smokers   Breast cancer Cousin        paternal cousin dx in her 48s   Liver cancer Cousin        paternal cousin with hepatitis and liver cancer   Breast cancer Other    Social History   Socioeconomic History   Marital status: Married    Spouse name: Alain Honey   Number of children: 5   Years of education: Not on file   Highest education level: Not on file  Occupational History   Occupation: RN   Tobacco Use   Smoking status: Never   Smokeless tobacco: Never  Vaping Use   Vaping Use: Never used  Substance and Sexual Activity   Alcohol use: No   Drug use: No   Sexual activity: Not on file  Other Topics Concern   Not on file  Social History Narrative   Right handed   Social Determinants of Health   Financial Resource Strain: Not on file  Food Insecurity: Not on file  Transportation Needs: Not on file  Physical Activity: Not on file  Stress: Not on file  Social Connections: Not on file   Past Surgical History:  Procedure Laterality Date   BREAST EXCISIONAL BIOPSY Right    infection to right breast 5 years ago.   BREAST SURGERY  07/01/2011.   I & D of right breast abscess   CHOLECYSTECTOMY  1995   COLONOSCOPY WITH PROPOFOL N/A 07/05/2016   Procedure: COLONOSCOPY WITH PROPOFOL;  Surgeon: Arta Silence, MD;  Location: [facility];  Service: Endoscopy;  Laterality: N/A;   LEFT HEART CATH AND CORONARY ANGIOGRAPHY N/A 03/26/2017  Procedure: LEFT HEART CATH AND CORONARY ANGIOGRAPHY;  Surgeon: Nelva Bush, MD;  Location: [place] [facility];  Service: Cardiovascular;  Laterality: N/A;   Past Surgical History:  Procedure Laterality Date   BREAST EXCISIONAL BIOPSY Right    infection to right breast 5 years ago.   BREAST SURGERY  07/01/2011.   I & D of right breast abscess   CHOLECYSTECTOMY  1995   COLONOSCOPY WITH PROPOFOL N/A 07/05/2016   Procedure: COLONOSCOPY WITH PROPOFOL;  Surgeon: Arta Silence, MD;  Location: [facility];  Service: Endoscopy;  Laterality: N/A;   LEFT HEART CATH AND CORONARY ANGIOGRAPHY N/A 03/26/2017   Procedure: LEFT HEART CATH AND CORONARY ANGIOGRAPHY;  Surgeon: Nelva Bush, MD;  Location: [place] [facility];  Service: Cardiovascular;  Laterality: N/A;   Past Medical History:  Diagnosis Date   Abnormal nuclear cardiac imaging test 03/23/2017   Arthritis    Bilateral primary osteoarthritis of knee 06/05/2016   Cellulitis and abscess of  trunk 04/30/2012   Chest pain 02/13/2017   Chronic pain of left knee 10/31/2011   Significant medial compartment DJD bilaterally. Both knees had steroid injection done on October 31, 2011   Chronic pain syndrome 63/14/9702   Complication of anesthesia 1995   woke up on table right after gallbladder surgery   Constipation    Corneal ulcer 08/06/2012   Edema, lower extremity    Essential hypertension 02/13/2017   Family history of adverse reaction to anesthesia    sister coded twice with anesthesai not sure of details sister still living   Family history of breast cancer    Family history of uterine cancer    Gallbladder problem    Genetic testing 09/07/2016   Negative genetic testing on the Common Hereditary cancer panel.  The Hereditary Gene Panel offered by [facility] includes sequencing and/or deletion duplication testing of the following 46 genes: APC, ATM, AXIN2, BARD1, BMPR1A, BRCA1, BRCA2, BRIP1, CDH1, CDKN2A (p14ARF), CDKN2A (p16INK4a), CHEK2, CTNNA1, DICER1, EPCAM (Deletion/duplication testing only), GREM1 (promoter region deletion/duplication te   Headache    migraines   Joint pain    Knee pain    Leg pain    Migraine    Mixed dyslipidemia 02/13/2017   Morbid obesity ([place]) 02/13/2017   Osteoarthritis    Sleep apnea    SOBOE (shortness of breath on exertion)    BP (!) 165/91    Pulse 77    Temp 97.7 F (36.5 C)    Ht 4' 10"  (1.473 m)    Wt (!) 345 lb (156.5 kg)    SpO2 94%    BMI 72.11 kg/m   Opioid Risk Score:   Fall Risk Score:  `1  Depression screen PHQ 2/9  Depression screen [facility] 2/9 04/05/2021 08/21/2018 05/21/2018 04/23/2018 04/04/2016 07/15/2015 12/14/2014  Decreased Interest 3 0 0 0 0 1 0  Down, Depressed, Hopeless 1 0 0 0 0 0 0  PHQ - 2 Score 4 0 0 0 0 1 0  Altered sleeping 3 - - - - 1 -  Tired, decreased energy 3 - - - - 1 -  Change in appetite 1 - - - - 0 -  Feeling bad or failure about yourself  1 - - - - 0 -  Trouble concentrating 0 - - - - 0 -  Moving slowly or  fidgety/restless 0 - - - - 0 -  Suicidal thoughts 0 - - - - 0 -  PHQ-9 Score 12 - - - - 3 -  Difficult doing work/chores Very difficult - - - - - -     Review of Systems  Constitutional: Negative.   HENT: Negative.    Eyes: Negative.   Respiratory: Negative.    Cardiovascular: Negative.   Gastrointestinal: Negative.   Endocrine: Negative.   Genitourinary: Negative.   Musculoskeletal:  Positive for gait problem.       Right and left hip pain  Skin: Negative.   Allergic/Immunologic: Negative.   Hematological: Negative.   Psychiatric/Behavioral: Negative.        Objective:   Physical Exam Vitals and nursing note reviewed.  Constitutional:      Appearance: Normal appearance.  Cardiovascular:     Rate and Rhythm: Normal rate and regular rhythm.     Pulses: Normal pulses.     Heart sounds: Normal heart sounds.  Pulmonary:     Effort: Pulmonary effort is normal.     Breath sounds: Normal breath sounds.  Musculoskeletal:     Cervical back: Normal range of motion and neck supple.     Comments: Normal Muscle Bulk and Muscle Testing Reveals:  Upper Extremities: Full ROM and Muscle Strength 5/5 Bilateral Greater Trochanter Tenderness  Lower Extremities: Decreased ROM and Muscle Strength 4/5 Arrived in Scooter     Skin:    General: Skin is warm and dry.  Neurological:     Mental Status: She is alert and oriented to person, place, and time.  Psychiatric:        Mood and Affect: Mood normal.        Behavior: Behavior normal.         Assessment & Plan:  1. 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 Continue HEP as Tolerated. 04/12/2021 Refilled: Bel[place] 450 MCG Film inside cheek every 12 hours #60.  We will continue the opioid monitoring program, this consists of regular clinic visits, examinations, urine drug screen, pill counts as well as use of [place] Controlled Substance Reporting system. A 12 month History has been reviewed on the [place] on 04/12/2021. 2. Bilateral Hip osteoarthritis: Continue HEP as Tolerated. Continue to Monitor. 04/12/2021  3. Bilateral Greater Trochanteric Tenderness: Continue to Alternate Ice and Heat Therapy. Continue to Monitor. 04/12/2021 4. Migraines Without Aura:  Neurology Following. Continue to Monitor. 04/12/2021   F/U in 3 months

## 2021-04-19 ENCOUNTER — Encounter (INDEPENDENT_AMBULATORY_CARE_PROVIDER_SITE_OTHER): Payer: Self-pay | Admitting: Family Medicine

## 2021-04-25 NOTE — Telephone Encounter (Signed)
PA for [facility] done

## 2021-04-26 ENCOUNTER — Encounter (INDEPENDENT_AMBULATORY_CARE_PROVIDER_SITE_OTHER): Payer: Self-pay | Admitting: Family Medicine

## 2021-04-26 ENCOUNTER — Ambulatory Visit (INDEPENDENT_AMBULATORY_CARE_PROVIDER_SITE_OTHER): Payer: 59 | Admitting: Family Medicine

## 2021-04-26 ENCOUNTER — Other Ambulatory Visit: Payer: Self-pay

## 2021-04-26 VITALS — BP 146/75 | HR 66 | Temp 97.9°F | Ht <= 58 in | Wt 345.0 lb

## 2021-04-26 DIAGNOSIS — R632 Polyphagia: Secondary | ICD-10-CM

## 2021-04-26 DIAGNOSIS — G4709 Other insomnia: Secondary | ICD-10-CM

## 2021-04-26 DIAGNOSIS — Z6841 Body Mass Index (BMI) 40.0 and over, adult: Secondary | ICD-10-CM

## 2021-04-26 DIAGNOSIS — E669 Obesity, unspecified: Secondary | ICD-10-CM | POA: Diagnosis not present

## 2021-04-26 DIAGNOSIS — E8881 Metabolic syndrome: Secondary | ICD-10-CM

## 2021-04-27 ENCOUNTER — Encounter (INDEPENDENT_AMBULATORY_CARE_PROVIDER_SITE_OTHER): Payer: Self-pay | Admitting: Family Medicine

## 2021-04-27 NOTE — Progress Notes (Signed)
Chief Complaint:   OBESITY Anita Booker is here to discuss her progress with her obesity treatment plan along with follow-up of her obesity related diagnoses. See Medical Weight Management Flowsheet for complete bioelectrical impedance results.  Today's visit was #: 2 Starting weight: 345 lbs Starting date: 04/05/2021 Weight change since last visit: 0 Total lbs lost to date: 0  Nutrition Plan: Journaling. Activity: PT/sitting cardio for 20 minutes 7 times per week.  Interim History: Anita Booker's food diary was reviewed.  She is getting in around 1200-1500 calories at each meal.  Assessment/Plan:   1. Other insomnia This is poorly controlled. Dysfunction: early morning awakening. Average hours of sleep per night: 4-6. Current treatment: Ambien 5 mg.  Plan: Start Restoril 15 mg at bedtime.  2. Polyphagia Uncontrolled.  Current treatment: None.    Plan:  Start phentermine 37.5 mg once daily with instructions to take 1/2 tab daily. She will continue to focus on protein-rich, low simple carbohydrate foods. We reviewed the importance of hydration, regular exercise for stress reduction, and restorative sleep.  We reviewed potential side effects including insomnia, dry mouth, increased heart rate and blood pressure, and increased anxiety. We reviewed reducing caffeine consumption while taking phentermine, especially if the patient is experiencing side effects. Alternative treatment options were discussed. All questions were answered, and the patient wishes to move forward with this medication. She was recently on this medication without any side effects or concerns.  I have consulted the [place] Controlled Substances Registry for this patient, and feel the risk/benefit ratio today is favorable for proceeding with this prescription for a controlled substance. The patient understands monitoring parameters and red flags.   3. Insulin resistance Not at goal. Goal is HgbA1c < 5.7, fasting insulin closer to  5.  Medication: None.    Plan:  Sample of Ozempic 2 mg given with instructions for 0.25 mg weekly x1-2 months, then 0.5 mg weekly.  She will continue to focus on protein-rich, low simple carbohydrate foods. We reviewed the importance of hydration, regular exercise for stress reduction, and restorative sleep.   Lab Results  Component Value Date   HGBA1C 5.6 01/25/2011   4. Obesity, current BMI 72.12  Course: Anita Booker is currently in the action stage of change. As such, her goal is to continue with weight loss efforts.   Nutrition goals: She has agreed to journal.   Exercise goals:  As is.  Behavioral modification strategies: increasing lean protein intake, decreasing simple carbohydrates, increasing vegetables, and increasing water intake.  Anita Booker has agreed to follow-up with our clinic in 3 weeks. She was informed of the importance of frequent follow-up visits to maximize her success with intensive lifestyle modifications for her multiple health conditions.   Objective:   Blood pressure (!) 146/75, pulse 66, temperature 97.9 F (36.6 C), temperature source Oral, height 4\' 10"  (1.473 m), weight (!) 345 lb (156.5 kg), SpO2 95 %. Body mass index is 72.11 kg/m.  General: Cooperative, alert, well developed, in no acute distress. HEENT: Conjunctivae and lids unremarkable. Cardiovascular: Regular rhythm.  Lungs: Normal work of breathing. Neurologic: No focal deficits.   Lab Results  Component Value Date   CREATININE 0.63 03/23/2017   BUN 12 03/23/2017   NA 141 03/23/2017   K 3.7 03/23/2017   CL 102 03/23/2017   CO2 31 03/23/2017   Lab Results  Component Value Date   ALT 21 04/11/2011   AST 19 04/11/2011   ALKPHOS 70 04/11/2011   BILITOT 0.9 04/11/2011  Lab Results  Component Value Date   HGBA1C 5.6 01/25/2011   HGBA1C 5.7 12/15/2009   HGBA1C 5.2 10/19/2006   Lab Results  Component Value Date   TSH 1.37 01/25/2011   Lab Results  Component Value Date   CHOL 149  04/11/2011   HDL 45.10 04/11/2011   LDLCALC 87 04/11/2011   TRIG 83.0 04/11/2011   CHOLHDL 3 04/11/2011   Lab Results  Component Value Date   WBC 6.9 03/23/2017   HGB 13.8 03/23/2017   HCT 40.6 03/23/2017   MCV 84.9 03/23/2017   PLT 331 03/23/2017   Attestation Statements:   Reviewed by clinician on day of visit: allergies, medications, problem list, medical history, surgical history, family history, social history, and previous encounter notes.  I, Insurance claims handler, CMA, am acting as transcriptionist for Helane Rima, DO  I have reviewed the above documentation for accuracy and completeness, and I agree with the above. -  Helane Rima, DO, MS, FAAFP, DABOM - Family and Bariatric Medicine.

## 2021-04-28 MED ORDER — TEMAZEPAM 15 MG PO CAPS: 15.0000 mg | ORAL_CAPSULE | Freq: Every day | ORAL | 0 refills | Status: DC

## 2021-04-28 MED ORDER — PHENTERMINE HCL 37.5 MG PO TABS: 37.5000 mg | ORAL_TABLET | Freq: Every day | ORAL | 0 refills | Status: DC

## 2021-04-28 NOTE — Telephone Encounter (Signed)
LOV w/ [place]

## 2021-05-18 ENCOUNTER — Ambulatory Visit (INDEPENDENT_AMBULATORY_CARE_PROVIDER_SITE_OTHER): Payer: 59 | Admitting: Bariatrics

## 2021-05-18 ENCOUNTER — Encounter (INDEPENDENT_AMBULATORY_CARE_PROVIDER_SITE_OTHER): Payer: Self-pay

## 2021-05-18 ENCOUNTER — Other Ambulatory Visit: Payer: Self-pay

## 2021-05-18 ENCOUNTER — Encounter (INDEPENDENT_AMBULATORY_CARE_PROVIDER_SITE_OTHER): Payer: Self-pay | Admitting: Bariatrics

## 2021-05-18 ENCOUNTER — Ambulatory Visit (INDEPENDENT_AMBULATORY_CARE_PROVIDER_SITE_OTHER): Payer: 59 | Admitting: Family Medicine

## 2021-05-18 VITALS — BP 136/80 | Temp 98.7°F | Ht <= 58 in | Wt 330.0 lb

## 2021-05-18 DIAGNOSIS — Z6841 Body Mass Index (BMI) 40.0 and over, adult: Secondary | ICD-10-CM | POA: Diagnosis not present

## 2021-05-18 DIAGNOSIS — R632 Polyphagia: Secondary | ICD-10-CM

## 2021-05-18 DIAGNOSIS — E669 Obesity, unspecified: Secondary | ICD-10-CM | POA: Diagnosis not present

## 2021-05-18 DIAGNOSIS — E8881 Metabolic syndrome: Secondary | ICD-10-CM

## 2021-05-18 MED ORDER — PHENTERMINE HCL 37.5 MG PO TABS: 37.5000 mg | ORAL_TABLET | Freq: Every day | ORAL | 0 refills | Status: DC

## 2021-05-18 NOTE — Progress Notes (Signed)
? ? ? ?Chief Complaint:  ? ?OBESITY ?Anita Booker is here to discuss her progress with her obesity treatment plan along with follow-up of her obesity related diagnoses. Anita Booker is on keeping a food journal and adhering to recommended goals of 1500 calories and 90-100 grams of protein and states she is following her eating plan approximately 100% of the time. Anita Booker states she is using a sitting elliptical for 10 minutes 2 times per week. ? ?Today's visit was #: 3 ?Starting weight: 345 lbs ?Starting date: 04/05/2021 ?Today's weight: 330 lbs ?Today's date: 05/18/2021 ?Total lbs lost to date: 15 lbs ?Total lbs lost since last in-office visit: 15 lbs ? ?Interim History: Anita Booker is down 15 lbs since her last visit.  ? ?Subjective:  ? ?1. Polyphagia ?Anita Booker is currently taking phentermine.  ? ?2. Insulin resistance ?Anita Booker is taking phentermine currently. ? ?Assessment/Plan:  ? ?1. Polyphagia ?We will refill phentermine 37.5 mg for 1 month with no refills. Intensive lifestyle modifications are the first line treatment for this issue. We discussed several lifestyle modifications today and she will continue to work on diet, exercise and weight loss efforts. Orders and follow up as documented in patient record. ? ?Counseling ?Polyphagia is excessive hunger. ?Causes can include: low blood sugars, hypERthyroidism, PMS, lack of sleep, stress, insulin resistance, diabetes, certain medications, and diets that are deficient in protein and fiber. ?   ?- phentermine (ADIPEX-P) 37.5 MG tablet; Take 1 tablet (37.5 mg total) by mouth daily before breakfast.  Dispense: 30 tablet; Refill: 0 ? ?2. Insulin resistance ?Anita Booker will keep starches and sweets low. She will continue to work on weight loss, exercise, and decreasing simple carbohydrates to help decrease the risk of diabetes. Anita Booker agreed to follow-up with [place] as directed to closely monitor her progress. ? ?3. Obesity, current BMI 68.97 ?Anita Booker is currently in the action stage of change. As such, her  goal is to continue with weight loss efforts. She has agreed to keeping a food journal and adhering to recommended goals of 1500 calories and 90-100 grams of protein.  ? ?Anita Booker will continue to adhere closely to the plan 90-100%. We will refill phentermine 37.5 mg for 1 month with no refills.  ? ?- phentermine (ADIPEX-P) 37.5 MG tablet; Take 1 tablet (37.5 mg total) by mouth daily before breakfast.  Dispense: 30 tablet; Refill: 0 ? ?Exercise goals:  As is. ? ?Behavioral modification strategies: increasing lean protein intake, decreasing simple carbohydrates, increasing vegetables, increasing water intake, decreasing eating out, no skipping meals, meal planning and cooking strategies, keeping healthy foods in the home, and planning for success. ? ?Anita Booker has agreed to follow-up with our clinic in 3 weeks with Dr. Earlene Plater. She was informed of the importance of frequent follow-up visits to maximize her success with intensive lifestyle modifications for her multiple health conditions.  ? ?Objective:  ? ?Blood pressure 136/80, temperature 98.7 ?F (37.1 ?C), height 4\' 10"  (1.473 m), weight (!) 330 lb (149.7 kg). ?Body mass index is 68.97 kg/m?. ? ?General: Cooperative, alert, well developed, in no acute distress. ?HEENT: Conjunctivae and lids unremarkable. ?Cardiovascular: Regular rhythm.  ?Lungs: Normal work of breathing. ?Neurologic: No focal deficits.  ? ?Lab Results  ?Component Value Date  ? CREATININE 0.63 03/23/2017  ? BUN 12 03/23/2017  ? NA 141 03/23/2017  ? K 3.7 03/23/2017  ? CL 102 03/23/2017  ? CO2 31 03/23/2017  ? ?Lab Results  ?Component Value Date  ? ALT 21 04/11/2011  ? AST 19 04/11/2011  ? ALKPHOS  70 04/11/2011  ? BILITOT 0.9 04/11/2011  ? ?Lab Results  ?Component Value Date  ? HGBA1C 5.6 01/25/2011  ? HGBA1C 5.7 12/15/2009  ? HGBA1C 5.2 10/19/2006  ? ?No results found for: INSULIN ?Lab Results  ?Component Value Date  ? TSH 1.37 01/25/2011  ? ?Lab Results  ?Component Value Date  ? CHOL 149 04/11/2011  ?  HDL 45.10 04/11/2011  ? LDLCALC 87 04/11/2011  ? TRIG 83.0 04/11/2011  ? CHOLHDL 3 04/11/2011  ? ?No results found for: VD25OH ?Lab Results  ?Component Value Date  ? WBC 6.9 03/23/2017  ? HGB 13.8 03/23/2017  ? HCT 40.6 03/23/2017  ? MCV 84.9 03/23/2017  ? PLT 331 03/23/2017  ? ?No results found for: IRON, TIBC, FERRITIN ? ?Attestation Statements:  ? ?Reviewed by clinician on day of visit: allergies, medications, problem list, medical history, surgical history, family history, social history, and previous encounter notes. ? ?I, Jackson Latino, RMA, am acting as transcriptionist for [employer], DO. ? ?I have reviewed the above documentation for accuracy and completeness, and I agree with the above. Corinna Capra, DO ? ?

## 2021-05-21 ENCOUNTER — Encounter (INDEPENDENT_AMBULATORY_CARE_PROVIDER_SITE_OTHER): Payer: Self-pay | Admitting: Bariatrics

## 2021-06-02 ENCOUNTER — Other Ambulatory Visit (INDEPENDENT_AMBULATORY_CARE_PROVIDER_SITE_OTHER): Payer: Self-pay | Admitting: Bariatrics

## 2021-06-02 ENCOUNTER — Other Ambulatory Visit (INDEPENDENT_AMBULATORY_CARE_PROVIDER_SITE_OTHER): Payer: Self-pay | Admitting: Family Medicine

## 2021-06-02 NOTE — Telephone Encounter (Signed)
Dr.Brown 

## 2021-06-06 ENCOUNTER — Other Ambulatory Visit (INDEPENDENT_AMBULATORY_CARE_PROVIDER_SITE_OTHER): Payer: Self-pay

## 2021-06-09 ENCOUNTER — Other Ambulatory Visit: Payer: Self-pay | Admitting: Registered Nurse

## 2021-06-10 NOTE — Telephone Encounter (Signed)
PMP was Reviewed.  ?[place] e-scribed to pharmacy. Placed a call to Anita Booker, no answer. Left message to return the call if she has any questions.  ? ?

## 2021-06-16 ENCOUNTER — Encounter (INDEPENDENT_AMBULATORY_CARE_PROVIDER_SITE_OTHER): Payer: Self-pay | Admitting: Family Medicine

## 2021-06-16 ENCOUNTER — Ambulatory Visit (INDEPENDENT_AMBULATORY_CARE_PROVIDER_SITE_OTHER): Payer: 59 | Admitting: Family Medicine

## 2021-06-16 VITALS — BP 130/79 | HR 64 | Temp 97.9°F | Ht <= 58 in | Wt 320.0 lb

## 2021-06-16 DIAGNOSIS — Z6841 Body Mass Index (BMI) 40.0 and over, adult: Secondary | ICD-10-CM

## 2021-06-16 DIAGNOSIS — G8929 Other chronic pain: Secondary | ICD-10-CM | POA: Diagnosis not present

## 2021-06-16 DIAGNOSIS — R632 Polyphagia: Secondary | ICD-10-CM

## 2021-06-16 DIAGNOSIS — E669 Obesity, unspecified: Secondary | ICD-10-CM

## 2021-06-16 DIAGNOSIS — E8881 Metabolic syndrome: Secondary | ICD-10-CM

## 2021-06-16 DIAGNOSIS — G4709 Other insomnia: Secondary | ICD-10-CM | POA: Diagnosis not present

## 2021-06-16 DIAGNOSIS — E88819 Insulin resistance, unspecified: Secondary | ICD-10-CM

## 2021-06-17 MED ORDER — PHENTERMINE HCL 37.5 MG PO TABS: 37.5000 mg | ORAL_TABLET | Freq: Every day | ORAL | 0 refills | Status: DC

## 2021-06-21 ENCOUNTER — Encounter (INDEPENDENT_AMBULATORY_CARE_PROVIDER_SITE_OTHER): Payer: Self-pay | Admitting: Family Medicine

## 2021-06-22 NOTE — Progress Notes (Signed)
Chief Complaint:   OBESITY Anita Booker is here to discuss her progress with her obesity treatment plan along with follow-up of her obesity related diagnoses.   Today's visit was #: 4 Starting weight: 345 lbs Starting date: 04/05/2021 Today's weight: 320 lbs Today's date: 06/22/2021 Weight change since last visit: -10 lbs Total lbs lost to date: 25 lbs Body mass index is 66.88 kg/m.  Total weight loss percentage to date: -7.25%  Current Meal Plan: keeping a food journal and adhering to recommended goals of 1500 calories and 90-100 grams of protein for 100% of the time.  Current Exercise Plan: Cardio and PT for 20 minutes 7 times per week. Current Anti-Obesity Medications: Phentermine 37.5 mg daily, Ozempic 1 mg subcutaneously weekly. Side effects: None.  Interim History: [name] is taking Ozempic 1 mg subcutaneously weekly (sample pen). She is very happy with the combo of this plus the Phentermine.  Assessment/Plan:   1. Insulin resistance At goal. Goal is HgbA1c < 5.7, fasting insulin closer to 5.  Medication: Ozempic 1 mg subcutaneously weekly.    Plan:  She will continue to focus on protein-rich, low simple carbohydrate foods. We reviewed the importance of hydration, regular exercise for stress reduction, and restorative sleep.   Lab Results  Component Value Date   HGBA1C 5.6 01/25/2011   2. Polyphagia Controlled. Current treatment: Phentermine 37.5 mg daily.   Plan: She will continue to focus on protein-rich, low simple carbohydrate foods. We reviewed the importance of hydration, regular exercise for stress reduction, and restorative sleep.  - refill phentermine (ADIPEX-P) 37.5 MG tablet; Take 1 tablet (37.5 mg total) by mouth daily before breakfast.  Dispense: 90 tablet; Refill: 0  3. Other insomnia This is poorly controlled. Dysfunction: early morning awakening. Average hours of sleep per night: 4-6. Current  treatment: Ambien 5 mg. After discussion, patient would like to start below medication. Expectations, risks, and potential side effects reviewed.    Plan: Start Restoril 15 mg at bedtime.  4. Other chronic pain This issue directly impacts care plan for optimization of BMI and metabolic health as it impacts the patient's ability to make lifestyle changes.Will continue to monitor as it relates to her weight loss journey.  5. Obesity, current BMI 66.9 Course: [name] is currently in the action stage of change. As such, her goal is to continue with weight loss efforts.   Nutrition goals: She has agreed to keeping a food journal and adhering to recommended goals of 1500 calories and 90-100 grams of protein.   Exercise goals: As is.  Behavioral modification strategies: increasing lean protein intake, decreasing simple carbohydrates, and increasing vegetables.  [name] has agreed to follow-up with our clinic in 6 weeks. She was informed of the importance of frequent follow-up visits to maximize her success with intensive lifestyle modifications for her multiple health conditions.   Objective:   Blood pressure 130/79, pulse 64, temperature 97.9 F (36.6 C), temperature source Oral, height 4\' 10"  (1.473 m), weight (!) 320 lb (145.2 kg), SpO2 93 %. Body mass index is 66.88 kg/m.  General: Cooperative, alert, well developed, in no acute distress. HEENT: Conjunctivae and lids unremarkable. Cardiovascular: Regular rhythm.  Lungs: Normal work of breathing. Neurologic: No focal deficits.   Lab Results  Component Value Date   CREATININE 0.63 03/23/2017   BUN 12 03/23/2017   NA 141 03/23/2017   K 3.7 03/23/2017   CL 102 03/23/2017   CO2 31 03/23/2017   Lab Results  Component Value Date   ALT 21 04/11/2011   AST 19 04/11/2011   ALKPHOS 70 04/11/2011   BILITOT 0.9 04/11/2011   Lab Results  Component Value Date   HGBA1C 5.6 01/25/2011   HGBA1C 5.7 12/15/2009   HGBA1C 5.2 10/19/2006   Lab  Results  Component Value Date   TSH 1.37 01/25/2011   Lab Results  Component Value Date   CHOL 149 04/11/2011   HDL 45.10 04/11/2011   LDLCALC 87 04/11/2011   TRIG 83.0 04/11/2011   CHOLHDL 3 04/11/2011   Lab Results  Component Value Date   WBC 6.9 03/23/2017   HGB 13.8 03/23/2017   HCT 40.6 03/23/2017   MCV 84.9 03/23/2017   PLT 331 03/23/2017   Attestation Statements:   Reviewed by clinician on day of visit: allergies, medications, problem list, medical history, surgical history, family history, social history, and previous encounter notes.  Carlos Levering Friedenbach, CMA, am acting as Energy manager for [employer], DO.  I have reviewed the above documentation for accuracy and completeness, and I agree with the above. -  Helane Rima, DO, MS, FAAFP, DABOM - Family and Bariatric Medicine.

## 2021-06-30 ENCOUNTER — Ambulatory Visit: Payer: 59 | Admitting: Registered Nurse

## 2021-07-01 ENCOUNTER — Encounter (INDEPENDENT_AMBULATORY_CARE_PROVIDER_SITE_OTHER): Payer: Self-pay | Admitting: Family Medicine

## 2021-07-04 ENCOUNTER — Encounter: Payer: 59 | Attending: Registered Nurse | Admitting: Registered Nurse

## 2021-07-04 ENCOUNTER — Encounter: Payer: Self-pay | Admitting: Registered Nurse

## 2021-07-04 VITALS — BP 144/86 | HR 71 | Ht <= 58 in

## 2021-07-04 DIAGNOSIS — M7062 Trochanteric bursitis, left hip: Secondary | ICD-10-CM | POA: Diagnosis present

## 2021-07-04 DIAGNOSIS — Z79891 Long term (current) use of opiate analgesic: Secondary | ICD-10-CM | POA: Insufficient documentation

## 2021-07-04 DIAGNOSIS — G894 Chronic pain syndrome: Secondary | ICD-10-CM | POA: Insufficient documentation

## 2021-07-04 DIAGNOSIS — M7061 Trochanteric bursitis, right hip: Secondary | ICD-10-CM | POA: Insufficient documentation

## 2021-07-04 DIAGNOSIS — Z5181 Encounter for therapeutic drug level monitoring: Secondary | ICD-10-CM | POA: Diagnosis present

## 2021-07-04 DIAGNOSIS — M170 Bilateral primary osteoarthritis of knee: Secondary | ICD-10-CM | POA: Diagnosis present

## 2021-07-04 MED ORDER — BELBUCA 450 MCG BU FILM: ORAL_FILM | BUCCAL | 2 refills | Status: DC

## 2021-07-04 NOTE — Telephone Encounter (Signed)
Last OV with Dr Wallace 

## 2021-07-04 NOTE — Progress Notes (Signed)
? ?Subjective:  ? ? Patient ID: Anita Booker, female    DOB: 1960/03/22, 61 y.o.   MRN: [id number] ? ?HPI: Anita Booker is a 61 y.o. female who returns for follow up appointment for chronic pain and medication refill. She states her pain is located in her bilateral hips and bilateral knees. She rates her pain 7. Her current exercise regime is walking short distances in her home with cane, performing stretching exercise and elliptical twice a day for 10 minutes. ? ?[name] Last Oral Swab was Performed on 12/30/2020, it was consistent.  ?  ? ?Pain Inventory ?Average Pain 7 ?Pain Right Now 7 ?My pain is sharp, burning, stabbing, and aching ? ?In the last 24 hours, has pain interfered with the following? ?General activity 8 ?Relation with others 7 ?Enjoyment of life 7 ?What TIME of day is your pain at its worst? evening ?Sleep (in general) Poor ? ?Pain is worse with: walking, bending, sitting, standing, and some activites ?Pain improves with: rest and medication ?Relief from Meds: 8 ? ?Family History  ?Problem Relation Age of Onset  ? Cancer Mother   ? Thyroid disease Mother   ? Stroke Mother   ? Hypertension Mother   ? Diabetes Mother   ? Uterine cancer Mother 45  ? Obesity Mother   ? Obesity Father   ? Hypertension Father   ? Lung cancer Father 13  ? Cancer Father   ?     lung  ? Breast cancer Sister 68  ?     identical twin  ? Healthy Sister   ? Healthy Brother   ? Heart attack Brother 64  ? Cancer Paternal Grandmother   ?     breast  ? Lung cancer Paternal Uncle   ?     all four uncles had lung cancer and were smokers  ? Breast cancer Cousin   ?     paternal cousin dx in her 3s  ? Liver cancer Cousin   ?     paternal cousin with hepatitis and liver cancer  ? Breast cancer Other   ? ?Social History  ? ?Socioeconomic History  ? Marital status: Married  ?  Spouse name: Anita Booker  ? Number of children: 5  ? Years of education: Not on file  ? Highest education level: Not on  file  ?Occupational History  ? Occupation: Therapist, sports  ?Tobacco Use  ? Smoking status: Never  ? Smokeless tobacco: Never  ?Vaping Use  ? Vaping Use: Never used  ?Substance and Sexual Activity  ? Alcohol use: No  ? Drug use: No  ? Sexual activity: Not on file  ?Other Topics Concern  ? Not on file  ?Social History Narrative  ? Right handed  ? ?Social Determinants of Health  ? ?Financial Resource Strain: Not on file  ?Food Insecurity: Not on file  ?Transportation Needs: Not on file  ?Physical Activity: Not on file  ?Stress: Not on file  ?Social Connections: Not on file  ? ?Past Surgical History:  ?Procedure Laterality Date  ? BREAST EXCISIONAL BIOPSY Right   ? infection to right breast 5 years ago.  ? BREAST SURGERY  07/01/2011.  ? I & D of right breast abscess  ? CHOLECYSTECTOMY  1995  ? COLONOSCOPY WITH PROPOFOL N/A 07/05/2016  ? Procedure: COLONOSCOPY WITH PROPOFOL;  Surgeon: Arta Silence, MD;  Location: [facility];  Service: Endoscopy;  Laterality: N/A;  ? LEFT HEART CATH AND CORONARY ANGIOGRAPHY  N/A 03/26/2017  ? Procedure: LEFT HEART CATH AND CORONARY ANGIOGRAPHY;  Surgeon: Nelva Bush, MD;  Location: [facility];  Service: Cardiovascular;  Laterality: N/A;  ? ?Past Surgical History:  ?Procedure Laterality Date  ? BREAST EXCISIONAL BIOPSY Right   ? infection to right breast 5 years ago.  ? BREAST SURGERY  07/01/2011.  ? I & D of right breast abscess  ? CHOLECYSTECTOMY  1995  ? COLONOSCOPY WITH PROPOFOL N/A 07/05/2016  ? Procedure: COLONOSCOPY WITH PROPOFOL;  Surgeon: Arta Silence, MD;  Location: [facility];  Service: Endoscopy;  Laterality: N/A;  ? LEFT HEART CATH AND CORONARY ANGIOGRAPHY N/A 03/26/2017  ? Procedure: LEFT HEART CATH AND CORONARY ANGIOGRAPHY;  Surgeon: Nelva Bush, MD;  Location: [facility];  Service: Cardiovascular;  Laterality: N/A;  ? ?Past Medical History:  ?Diagnosis Date  ? Abnormal nuclear cardiac imaging test 03/23/2017  ? Arthritis   ? Bilateral primary osteoarthritis of  knee 06/05/2016  ? Cellulitis and abscess of trunk 04/30/2012  ? Chest pain 02/13/2017  ? Chronic pain of left knee 10/31/2011  ? Significant medial compartment DJD bilaterally. Both knees had steroid injection done on October 31, 2011  ? Chronic pain syndrome 09/28/2016  ? Complication of anesthesia 1995  ? woke up on table right after gallbladder surgery  ? Constipation   ? Corneal ulcer 08/06/2012  ? Edema, lower extremity   ? Essential hypertension 02/13/2017  ? Family history of adverse reaction to anesthesia   ? sister coded twice with anesthesai not sure of details sister still living  ? Family history of breast cancer   ? Family history of uterine cancer   ? Gallbladder problem   ? Genetic testing 09/07/2016  ? Negative genetic testing on the Common Hereditary cancer panel.  The Hereditary Gene Panel offered by [facility] includes sequencing and/or deletion duplication testing of the following 46 genes: APC, ATM, AXIN2, BARD1, BMPR1A, BRCA1, BRCA2, BRIP1, CDH1, CDKN2A (p14ARF), CDKN2A (p16INK4a), CHEK2, CTNNA1, DICER1, EPCAM (Deletion/duplication testing only), GREM1 (promoter region deletion/duplication te  ? Headache   ? migraines  ? Joint pain   ? Knee pain   ? Leg pain   ? Migraine   ? Mixed dyslipidemia 02/13/2017  ? Morbid obesity ([place]) 02/13/2017  ? Osteoarthritis   ? Sleep apnea   ? SOBOE (shortness of breath on exertion)   ? ?BP (!) 144/86   Pulse 71   Ht $R'4\' 10"'lP$  (1.473 m)   SpO2 96%   BMI 66.88 kg/m?  ? ?Opioid Risk Score:   ?Fall Risk Score:  `1 ? ?Depression screen PHQ 2/9 ? ? ?  07/04/2021  ? 11:27 AM 04/12/2021  ? 12:01 PM 04/05/2021  ?  8:43 AM 08/21/2018  ? 11:49 AM 05/21/2018  ? 11:35 AM 04/23/2018  ?  2:56 PM 04/04/2016  ?  9:46 AM  ?Depression screen PHQ 2/9  ?Decreased Interest 0 0 3 0 0 0 0  ?Down, Depressed, Hopeless 0 0 1 0 0 0 0  ?PHQ - 2 Score 0 0 4 0 0 0 0  ?Altered sleeping   3      ?Tired, decreased energy   3      ?Change in appetite   1      ?Feeling bad or failure about yourself    1       ?Trouble concentrating   0      ?Moving slowly or fidgety/restless   0      ?Suicidal  thoughts   0      ?PHQ-9 Score   12      ?Difficult doing work/chores   Very difficult      ?  ? ?Review of Systems  ?Constitutional: Negative.   ?HENT: Negative.    ?Eyes: Negative.   ?Respiratory: Negative.    ?Cardiovascular: Negative.   ?Gastrointestinal: Negative.   ?Endocrine: Negative.   ?Genitourinary: Negative.   ?Musculoskeletal:  Positive for gait problem.  ?Skin: Negative.   ?Allergic/Immunologic: Negative.   ?Hematological: Negative.   ?Psychiatric/Behavioral:  Positive for sleep disturbance.   ? ?   ?Objective:  ? Physical Exam ?Vitals and nursing note reviewed.  ?Constitutional:   ?   Appearance: Normal appearance.  ?Cardiovascular:  ?   Rate and Rhythm: Normal rate and regular rhythm.  ?   Pulses: Normal pulses.  ?   Heart sounds: Normal heart sounds.  ?Pulmonary:  ?   Effort: Pulmonary effort is normal.  ?   Breath sounds: Normal breath sounds.  ?Musculoskeletal:  ?   Cervical back: Normal range of motion and neck supple.  ?   Comments: Normal Muscle Bulk and Muscle Testing Reveals: ? Upper Extremities: Full ROM and Muscle Strength 5/5 ?Bilateral Greater Trochanter Tenderness ? Lower Extremities: Decreased ROM and Muscle Strength 4/5 ?Bilateral Lower Extremities Flexion Produces Pain into her bilateral knees ?Arrived in scooter  ? ?   ?Skin: ?   General: Skin is warm and dry.  ?Neurological:  ?   Mental Status: She is alert and oriented to person, place, and time.  ?Psychiatric:     ?   Mood and Affect: Mood normal.     ?   Behavior: Behavior normal.  ? ? ? ? ?   ?Assessment & Plan:  ?1. 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 Continue HEP as Tolerated. 07/04/2021 ?Refilled: [place] 450 MCG Film inside cheek every 12 hours #60.  ?We will continue the opioid monitoring program, this consists of regular clinic visits, examinations, urine drug screen, pill counts as well as use of [place] Controlled Substance Reporting system. A 12 month History has been reviewed on the [place] Controlled Substance Reporting System on 07/04/2021. ?2. Bilateral Hip osteoarthritis: Continue HEP as

## 2021-10-04 ENCOUNTER — Encounter: Payer: Self-pay | Admitting: Registered Nurse

## 2021-10-04 ENCOUNTER — Encounter: Payer: 59 | Attending: Registered Nurse | Admitting: Registered Nurse

## 2021-10-04 VITALS — BP 132/85 | HR 81 | Ht <= 58 in | Wt 297.0 lb

## 2021-10-04 DIAGNOSIS — G894 Chronic pain syndrome: Secondary | ICD-10-CM | POA: Diagnosis not present

## 2021-10-04 DIAGNOSIS — Z79891 Long term (current) use of opiate analgesic: Secondary | ICD-10-CM | POA: Diagnosis not present

## 2021-10-04 DIAGNOSIS — Z5181 Encounter for therapeutic drug level monitoring: Secondary | ICD-10-CM | POA: Insufficient documentation

## 2021-10-04 MED ORDER — BELBUCA 450 MCG BU FILM: ORAL_FILM | BUCCAL | 2 refills | Status: DC

## 2021-10-04 NOTE — Progress Notes (Signed)
Subjective:    Patient ID: Anita Booker, female    DOB: September 03, 1960, 61 y.o.   MRN: [id number]  HPI: Anita Booker is a 61 y.o. female who returns for follow up appointment for chronic pain and medication refill. She states her  pain is located in her bilateral hips and bilateral knee pain. She rates her pain 7. Her current exercise regime is walking short distances in her home with a cane.   Anita Booker arrived in her scooter.    Pain Inventory Average Pain 7 Pain Right Now 7 My pain is constant, sharp, and tingling  In the last 24 hours, has pain interfered with the following? General activity 6 Relation with others 6 Enjoyment of life 6 What TIME of day is your pain at its worst? evening Sleep (in general) Poor  Pain is worse with: standing Pain improves with: rest, heat/ice, and medication Relief from Meds: 6  Family History  Problem Relation Age of Onset   Cancer Mother    Thyroid disease Mother    Stroke Mother    Hypertension Mother    Diabetes Mother    Uterine cancer Mother 25   Obesity Mother    Obesity Father    Hypertension Father    Lung cancer Father 28   Cancer Father        lung   Breast cancer Sister 66       identical twin   Healthy Sister    Healthy Brother    Heart attack Brother 77   Cancer Paternal Grandmother        breast   Lung cancer Paternal Uncle        all four uncles had lung cancer and were smokers   Breast cancer Cousin        paternal cousin dx in her 11s   Liver cancer Cousin        paternal cousin with hepatitis and liver cancer   Breast cancer Other    Social History   Socioeconomic History   Marital status: Married    Spouse name: Alain Honey   Number of children: 5   Years of education: Not on file   Highest education level: Not on file  Occupational History   Occupation: RN  Tobacco Use   Smoking status: Never   Smokeless tobacco: Never  Vaping Use   Vaping Use: Never used   Substance and Sexual Activity   Alcohol use: No   Drug use: No   Sexual activity: Not on file  Other Topics Concern   Not on file  Social History Narrative   Right handed   Social Determinants of Health   Financial Resource Strain: Not on file  Food Insecurity: Not on file  Transportation Needs: Not on file  Physical Activity: Not on file  Stress: Not on file  Social Connections: Not on file   Past Surgical History:  Procedure Laterality Date   BREAST EXCISIONAL BIOPSY Right    infection to right breast 5 years ago.   BREAST SURGERY  07/01/2011.   I & D of right breast abscess   CHOLECYSTECTOMY  1995   COLONOSCOPY WITH PROPOFOL N/A 07/05/2016   Procedure: COLONOSCOPY WITH PROPOFOL;  Surgeon: Arta Silence, MD;  Location: [facility];  Service: Endoscopy;  Laterality: N/A;   LEFT HEART CATH AND CORONARY ANGIOGRAPHY N/A 03/26/2017   Procedure: LEFT HEART CATH AND CORONARY ANGIOGRAPHY;  Surgeon: Nelva Bush, MD;  Location: [facility];  Service: Cardiovascular;  Laterality: N/A;   Past Surgical History:  Procedure Laterality Date   BREAST EXCISIONAL BIOPSY Right    infection to right breast 5 years ago.   BREAST SURGERY  07/01/2011.   I & D of right breast abscess   CHOLECYSTECTOMY  1995   COLONOSCOPY WITH PROPOFOL N/A 07/05/2016   Procedure: COLONOSCOPY WITH PROPOFOL;  Surgeon: William Outlaw, MD;  [facility]: [facility];  Service: Endoscopy;  Laterality: N/A;   LEFT HEART CATH AND CORONARY ANGIOGRAPHY N/A 03/26/2017   Procedure: LEFT HEART CATH AND CORONARY ANGIOGRAPHY;  Surgeon: End, Christopher, MD;  Location: [facility];  Service: Cardiovascular;  Laterality: N/A;   Past Medical History:  Diagnosis Date   Abnormal nuclear cardiac imaging test 03/23/2017   Arthritis    Bilateral primary osteoarthritis of knee 06/05/2016   Cellulitis and abscess of trunk 04/30/2012   Chest pain 02/13/2017   Chronic pain of left knee 10/31/2011   Significant medial  compartment DJD bilaterally. Both knees had steroid injection done on October 31, 2011   Chronic pain syndrome 09/28/2016   Complication of anesthesia 1995   woke up on table right after gallbladder surgery   Constipation    Corneal ulcer 08/06/2012   Edema, lower extremity    Essential hypertension 02/13/2017   Family history of adverse reaction to anesthesia    sister coded twice with anesthesai not sure of details sister still living   Family history of breast cancer    Family history of uterine cancer    Gallbladder problem    Genetic testing 09/07/2016   Negative genetic testing on the Common Hereditary cancer panel.  The Hereditary Gene Panel offered by [facility] includes sequencing and/or deletion duplication testing of the following 46 genes: APC, ATM, AXIN2, BARD1, BMPR1A, BRCA1, BRCA2, BRIP1, CDH1, CDKN2A (p14ARF), CDKN2A (p16INK4a), CHEK2, CTNNA1, DICER1, EPCAM (Deletion/duplication testing only), GREM1 (promoter region deletion/duplication te   Headache    migraines   Joint pain    Knee pain    Leg pain    Migraine    Mixed dyslipidemia 02/13/2017   Morbid obesity (HCC) 02/13/2017   Osteoarthritis    Sleep apnea    SOBOE (shortness of breath on exertion)    BP 132/85   Pulse 81   Ht 4' 10" (1.473 m)   Wt 297 lb (134.7 kg)   SpO2 93%   BMI 62.07 kg/m   Opioid Risk Score:   Fall Risk Score:  `1  Depression screen PHQ 2/9     10/04/2021   11:00 AM 07/04/2021   11:27 AM 04/12/2021   12:01 PM 04/05/2021    8:43 AM 08/21/2018   11:49 AM 05/21/2018   11:35 AM 04/23/2018    2:56 PM  Depression screen PHQ 2/9  Decreased Interest 0 0 0 3 0 0 0  Down, Depressed, Hopeless 0 0 0 1 0 0 0  PHQ - 2 Score 0 0 0 4 0 0 0  Altered sleeping    3     Tired, decreased energy    3     Change in appetite    1     Feeling bad or failure about yourself     1     Trouble concentrating    0     Moving slowly or fidgety/restless    0     Suicidal thoughts    0     PHQ-9 Score    12        Difficult doing work/chores    Very difficult        Review of Systems  Musculoskeletal:        Bilateral hips and knee pain  Neurological:  Positive for weakness.  All other systems reviewed and are negative.     Objective:   Physical Exam Vitals and nursing note reviewed.  Constitutional:      Appearance: Normal appearance.  Cardiovascular:     Rate and Rhythm: Normal rate and regular rhythm.     Pulses: Normal pulses.     Heart sounds: Normal heart sounds.  Pulmonary:     Effort: Pulmonary effort is normal.     Breath sounds: Normal breath sounds.  Musculoskeletal:     Cervical back: Normal range of motion and neck supple.     Comments: Normal Muscle Bulk and Muscle Testing Reveals:  Upper Extremities: Full ROM and Muscle Strength 5/5 Bilateral Greater Trochanter Tenderness  Lower Extremities : Decreased ROM and Muscle Strength 5/5 Bilateral Lower Extremities Flexion Produces Pain into her Bilateral  hips and Bilateral Patellas'  Arrived in scooter    Skin:    General: Skin is warm and dry.  Neurological:     Mental Status: She is alert and oriented to person, place, and time.  Psychiatric:        Mood and Affect: Mood normal.        Behavior: Behavior normal.         Assessment & Plan:  1. 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 Continue HEP as Tolerated. 10/04/2021 Refilled: Belbuca 450 MCG Film inside cheek every 12 hours #60.  We will continue the opioid monitoring program, this consists of regular clinic visits, examinations, urine drug screen, pill counts as well as use of [place] Controlled Substance Reporting system. A 12 month History has been reviewed on the [place] on 10/04/2021. 2. Bilateral Hip osteoarthritis: Continue HEP as Tolerated. Continue to Monitor. 10/04/2021  3. Bilateral Greater Trochanteric Tenderness: Continue to Alternate Ice and Heat Therapy. Continue to  Monitor. 10/04/2021 4. 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 Neurology Following. Continue to Monitor. 10/04/2021   F/U in 3 months

## 2021-10-08 LAB — DRUG TOX MONITOR 1 W/CONF, ORAL FLD
Amphetamines: NEGATIVE ng/mL (ref <10)
Barbiturates: NEGATIVE ng/mL (ref <10)
Benzodiazepines: NEGATIVE ng/mL (ref <0.50)
Buprenorphine: >25 ng/mL — ABNORMAL HIGH (ref <0.10)
Buprenorphine: POSITIVE ng/mL — AB (ref <0.10)
Cocaine: NEGATIVE ng/mL (ref <5.0)
Fentanyl: NEGATIVE ng/mL (ref <0.10)
Heroin Metabolite: NEGATIVE ng/mL (ref <1.0)
MARIJUANA: NEGATIVE ng/mL (ref <2.5)
MDMA: NEGATIVE ng/mL (ref <10)
Meprobamate: NEGATIVE ng/mL (ref <2.5)
Methadone: NEGATIVE ng/mL (ref <5.0)
Naloxone: NEGATIVE ng/mL (ref <0.25)
Nicotine Metabolite: NEGATIVE ng/mL (ref <5.0)
Norbuprenorphine: 4.47 ng/mL — ABNORMAL HIGH (ref <0.50)
Opiates: NEGATIVE ng/mL (ref <2.5)
Phencyclidine: NEGATIVE ng/mL (ref <10)
Tapentadol: NEGATIVE ng/mL (ref <5.0)
Tramadol: NEGATIVE ng/mL (ref <5.0)
Zolpidem: NEGATIVE ng/mL (ref <5.0)

## 2021-10-08 LAB — DRUG TOX ALC METAB W/CON, ORAL FLD: Alcohol Metabolite: NEGATIVE ng/mL (ref <25)

## 2021-10-12 ENCOUNTER — Telehealth: Payer: Self-pay | Admitting: *Deleted

## 2021-10-12 NOTE — Telephone Encounter (Signed)
Oral swab drug screen was consistent for prescribed medications.  ?

## 2021-10-26 ENCOUNTER — Encounter (INDEPENDENT_AMBULATORY_CARE_PROVIDER_SITE_OTHER): Payer: Self-pay

## 2022-01-05 ENCOUNTER — Encounter: Payer: Self-pay | Admitting: Registered Nurse

## 2022-01-05 ENCOUNTER — Encounter: Payer: 59 | Attending: Registered Nurse | Admitting: Registered Nurse

## 2022-01-05 VITALS — BP 110/75 | HR 68

## 2022-01-05 DIAGNOSIS — M7062 Trochanteric bursitis, left hip: Secondary | ICD-10-CM | POA: Diagnosis present

## 2022-01-05 DIAGNOSIS — Z79891 Long term (current) use of opiate analgesic: Secondary | ICD-10-CM | POA: Insufficient documentation

## 2022-01-05 DIAGNOSIS — M170 Bilateral primary osteoarthritis of knee: Secondary | ICD-10-CM | POA: Insufficient documentation

## 2022-01-05 DIAGNOSIS — G894 Chronic pain syndrome: Secondary | ICD-10-CM | POA: Insufficient documentation

## 2022-01-05 DIAGNOSIS — M7061 Trochanteric bursitis, right hip: Secondary | ICD-10-CM | POA: Diagnosis present

## 2022-01-05 DIAGNOSIS — Z5181 Encounter for therapeutic drug level monitoring: Secondary | ICD-10-CM | POA: Diagnosis not present

## 2022-01-05 MED ORDER — BELBUCA 450 MCG BU FILM: ORAL_FILM | BUCCAL | 2 refills | Status: DC

## 2022-01-05 NOTE — Progress Notes (Signed)
Subjective:    Patient ID: Anita Booker, female    DOB: [birth year], 61 y.o.   MRN: [id number]  HPI: Anita Booker is a 61 y.o. female who returns for follow up appointment for chronic pain and medication refill. She states her pain is located in her bilateral hips and bilateral knees. She rates her pain 7. Her current exercise regime is walking short distances with her cane.  Last Oral Swab was Performed on 10/04/2021, it was consistent.    Pain Inventory Average Pain 7 Pain Right Now 7 My pain is intermittent, sharp, burning, stabbing, and aching  In the last 24 hours, has pain interfered with the following? General activity 7 Relation with others 5 Enjoyment of life 5 What TIME of day is your pain at its worst? evening Sleep (in general) Poor  Pain is worse with: walking, bending, standing, and some activites Pain improves with: rest and medication Relief from Meds: 3  Family History  Problem Relation Age of Onset   Cancer Mother    Thyroid disease Mother    Stroke Mother    Hypertension Mother    Diabetes Mother    Uterine cancer Mother 71   Obesity Mother    Obesity Father    Hypertension Father    Lung cancer Father 72   Cancer Father        lung   Breast cancer Sister 31       identical twin   Healthy Sister    Healthy Brother    Heart attack Brother 72   Cancer Paternal Grandmother        breast   Lung cancer Paternal Uncle        all four uncles had lung cancer and were smokers   Breast cancer Cousin        paternal cousin dx in her 32s   Liver cancer Cousin        paternal cousin with hepatitis and liver cancer   Breast cancer Other    Social History   Socioeconomic History   Marital status: Married    Spouse name: Alain Honey   Number of children: 5   Years of education: Not on file   Highest education level: Not on file  Occupational History   Occupation: RN  Tobacco Use   Smoking status: Never   Smokeless  tobacco: Never  Vaping Use   Vaping Use: Never used  Substance and Sexual Activity   Alcohol use: No   Drug use: No   Sexual activity: Not on file  Other Topics Concern   Not on file  Social History Narrative   Right handed   Social Determinants of Health   Financial Resource Strain: Not on file  Food Insecurity: Not on file  Transportation Needs: Not on file  Physical Activity: Not on file  Stress: Not on file  Social Connections: Not on file   Past Surgical History:  Procedure Laterality Date   BREAST EXCISIONAL BIOPSY Right    infection to right breast 5 years ago.   BREAST SURGERY  07/01/2011.   I & D of right breast abscess   CHOLECYSTECTOMY  1995   COLONOSCOPY WITH PROPOFOL N/A 07/05/2016   Procedure: COLONOSCOPY WITH PROPOFOL;  Surgeon: Arta Silence, MD;  [facility]: [facility];  Service: Endoscopy;  Laterality: N/A;   LEFT HEART CATH AND CORONARY ANGIOGRAPHY N/A 03/26/2017   Procedure: LEFT HEART CATH AND CORONARY ANGIOGRAPHY;  Surgeon: Nelva Bush, MD;  Location: [facility];  Service: Cardiovascular;  Laterality: N/A;   Past Surgical History:  Procedure Laterality Date   BREAST EXCISIONAL BIOPSY Right    infection to right breast 5 years ago.   BREAST SURGERY  07/01/2011.   I & D of right breast abscess   CHOLECYSTECTOMY  1995   COLONOSCOPY WITH PROPOFOL N/A 07/05/2016   Procedure: COLONOSCOPY WITH PROPOFOL;  Surgeon: Arta Silence, MD;  [facility];  Service: Endoscopy;  Laterality: N/A;   LEFT HEART CATH AND CORONARY ANGIOGRAPHY N/A 03/26/2017   Procedure: LEFT HEART CATH AND CORONARY ANGIOGRAPHY;  Surgeon: Nelva Bush, MD;  Location: [facility];  Service: Cardiovascular;  Laterality: N/A;   Past Medical History:  Diagnosis Date   Abnormal nuclear cardiac imaging test 03/23/2017   Arthritis    Bilateral primary osteoarthritis of knee 06/05/2016   Cellulitis and abscess of trunk 04/30/2012   Chest pain 02/13/2017   Chronic  pain of left knee 10/31/2011   Significant medial compartment DJD bilaterally. Both knees had steroid injection done on October 31, 2011   Chronic pain syndrome 35/32/9924   Complication of anesthesia 1995   woke up on table right after gallbladder surgery   Constipation    Corneal ulcer 08/06/2012   Edema, lower extremity    Essential hypertension 02/13/2017   Family history of adverse reaction to anesthesia    sister coded twice with anesthesai not sure of details sister still living   Family history of breast cancer    Family history of uterine cancer    Gallbladder problem    Genetic testing 09/07/2016   Negative genetic testing on the Common Hereditary cancer panel.  The Hereditary Gene Panel offered by [facility] includes sequencing and/or deletion duplication testing of the following 46 genes: APC, ATM, AXIN2, BARD1, BMPR1A, BRCA1, BRCA2, BRIP1, CDH1, CDKN2A (p14ARF), CDKN2A (p16INK4a), CHEK2, CTNNA1, DICER1, EPCAM (Deletion/duplication testing only), GREM1 (promoter region deletion/duplication te   Headache    migraines   Joint pain    Knee pain    Leg pain    Migraine    Mixed dyslipidemia 02/13/2017   Morbid obesity ([place]) 02/13/2017   Osteoarthritis    Sleep apnea    SOBOE (shortness of breath on exertion)    There were no vitals taken for this visit.  Opioid Risk Score:   Fall Risk Score:  `1  Depression screen [facility] 2/9     10/04/2021   11:00 AM 07/04/2021   11:27 AM 04/12/2021   12:01 PM 04/05/2021    8:43 AM 08/21/2018   11:49 AM 05/21/2018   11:35 AM 04/23/2018    2:56 PM  Depression screen PHQ 2/9  Decreased Interest 0 0 0 3 0 0 0  Down, Depressed, Hopeless 0 0 0 1 0 0 0  PHQ - 2 Score 0 0 0 4 0 0 0  Altered sleeping    3     Tired, decreased energy    3     Change in appetite    1     Feeling bad or failure about yourself     1     Trouble concentrating    0     Moving slowly or fidgety/restless    0     Suicidal thoughts    0     PHQ-9 Score    12      Difficult doing work/chores    Very difficult       Review  of Systems  Constitutional: Negative.   Eyes: Negative.   Respiratory: Negative.    Cardiovascular: Negative.   Gastrointestinal: Negative.   Endocrine: Negative.   Genitourinary: Negative.   Musculoskeletal:  Positive for arthralgias.  Allergic/Immunologic: Negative.   Neurological: Negative.       Objective:   Physical Exam Vitals and nursing note reviewed.  Constitutional:      Appearance: Normal appearance.  Cardiovascular:     Rate and Rhythm: Normal rate and regular rhythm.     Pulses: Normal pulses.     Heart sounds: Normal heart sounds.  Pulmonary:     Effort: Pulmonary effort is normal.     Breath sounds: Normal breath sounds.  Musculoskeletal:     Cervical back: Normal range of motion.     Comments: Normal Muscle Bulk and Muscle Testing Reveals:  Upper Extremities: Full ROM and Muscle Strength 5/5  Lower Extremities: Right Lower Extremity: Decreased ROM and Muscle Strength 5/5 Right Lower Extremity Flexion Produces Pain into her Patella Left Lower Extremity: Full ROM and Muscle Strength 5/5 Arrived in scooter      Skin:    General: Skin is warm and dry.  Neurological:     Mental Status: She is alert and oriented to person, place, and time.  Psychiatric:        Mood and Affect: Mood normal.        Behavior: Behavior normal.         Assessment & Plan:  1. 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 Continue HEP as Tolerated. 01/05/2022 Refilled: Belbuca 450 MCG Film inside cheek every 12 hours #60.  We will continue the opioid monitoring program, this consists of regular clinic visits, examinations, urine drug screen, pill counts as well as use of [place] Controlled Substance Reporting system. A 12 month History has been reviewed on the [place] on 01/05/2022. 2. Bilateral Hip osteoarthritis: Continue HEP as Tolerated.  Continue to Monitor. 01/05/2022  3. Bilateral Greater Trochanteric Tenderness: Continue to Alternate Ice and Heat Therapy. Continue to Monitor. 01/05/2022 4. 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 Neurology Following. Continue to Monitor. 01/05/2022   F/U in 3 months

## 2022-03-21 ENCOUNTER — Other Ambulatory Visit: Payer: Self-pay | Admitting: Obstetrics and Gynecology

## 2022-03-21 DIAGNOSIS — Z1231 Encounter for screening mammogram for malignant neoplasm of breast: Secondary | ICD-10-CM

## 2022-04-11 ENCOUNTER — Encounter: Payer: 59 | Attending: Registered Nurse | Admitting: Registered Nurse

## 2022-04-11 VITALS — BP 120/76 | HR 68

## 2022-04-11 DIAGNOSIS — M7062 Trochanteric bursitis, left hip: Secondary | ICD-10-CM | POA: Diagnosis present

## 2022-04-11 DIAGNOSIS — G894 Chronic pain syndrome: Secondary | ICD-10-CM | POA: Diagnosis present

## 2022-04-11 DIAGNOSIS — Z5181 Encounter for therapeutic drug level monitoring: Secondary | ICD-10-CM | POA: Diagnosis present

## 2022-04-11 DIAGNOSIS — M7061 Trochanteric bursitis, right hip: Secondary | ICD-10-CM | POA: Diagnosis present

## 2022-04-11 DIAGNOSIS — M170 Bilateral primary osteoarthritis of knee: Secondary | ICD-10-CM | POA: Diagnosis present

## 2022-04-11 DIAGNOSIS — Z79891 Long term (current) use of opiate analgesic: Secondary | ICD-10-CM | POA: Insufficient documentation

## 2022-04-11 MED ORDER — BELBUCA 450 MCG BU FILM
ORAL_FILM | BUCCAL | 1 refills | Status: DC
  Filled 2022-06-12 – 2022-06-20 (×3): qty 60, 30d supply, fill #0

## 2022-04-11 NOTE — Progress Notes (Signed)
Subjective:    Patient ID: Anita Booker, female    DOB: 1960-07-26, 62 y.o.   MRN: [id number]  HPI: Anita Booker is a 62 y.o. female who returns for follow up appointment for chronic pain and medication refill. She states her pain is located in her bilateral hips and bilateral knees. She rates her pain 7. Her current exercise regime is using elliptical three times a week and pulley exercises twice a week.    Oral Swab was Performed today.    Pain Inventory Average Pain 7 Pain Right Now 7 My pain is aching  In the last 24 hours, has pain interfered with the following? General activity 6 Relation with others 6 Enjoyment of life 6 What TIME of day is your pain at its worst? evening and night Sleep (in general) Fair  Pain is worse with: walking, bending, and standing Pain improves with: rest, heat/ice, pacing activities, and medication Relief from Meds: 5  Family History  Problem Relation Age of Onset   Cancer Mother    Thyroid disease Mother    Stroke Mother    Hypertension Mother    Diabetes Mother    Uterine cancer Mother 23   Obesity Mother    Obesity Father    Hypertension Father    Lung cancer Father 71   Cancer Father        lung   Breast cancer Sister 34       identical twin   Healthy Sister    Healthy Brother    Heart attack Brother 48   Cancer Paternal Grandmother        breast   Lung cancer Paternal Uncle        all four uncles had lung cancer and were smokers   Breast cancer Cousin        paternal cousin dx in her 32s   Liver cancer Cousin        paternal cousin with hepatitis and liver cancer   Breast cancer Other    Social History   Socioeconomic History   Marital status: Married    Spouse name: Wonda Horner   Number of children: 5   Years of education: Not on file   Highest education level: Not on file  Occupational History   Occupation: RN  Tobacco Use   Smoking status: Never   Smokeless tobacco: Never   Vaping Use   Vaping Use: Never used  Substance and Sexual Activity   Alcohol use: No   Drug use: No   Sexual activity: Not on file  Other Topics Concern   Not on file  Social History Narrative   Right handed   Social Determinants of Health   Financial Resource Strain: Not on file  Food Insecurity: Not on file  Transportation Needs: Not on file  Physical Activity: Not on file  Stress: Not on file  Social Connections: Not on file   Past Surgical History:  Procedure Laterality Date   BREAST EXCISIONAL BIOPSY Right    infection to right breast 5 years ago.   BREAST SURGERY  07/01/2011.   I & D of right breast abscess   CHOLECYSTECTOMY  1995   COLONOSCOPY WITH PROPOFOL N/A 07/05/2016   Procedure: COLONOSCOPY WITH PROPOFOL;  Surgeon: Willis Modena, MD;  [facility];  Service: Endoscopy;  Laterality: N/A;   LEFT HEART CATH AND CORONARY ANGIOGRAPHY N/A 03/26/2017   Procedure: LEFT HEART CATH AND CORONARY ANGIOGRAPHY;  Surgeon: Yvonne Kendall, MD;  Location: [facility];  Service: Cardiovascular;  Laterality: N/A;   Past Surgical History:  Procedure Laterality Date   BREAST EXCISIONAL BIOPSY Right    infection to right breast 5 years ago.   BREAST SURGERY  07/01/2011.   I & D of right breast abscess   CHOLECYSTECTOMY  1995   COLONOSCOPY WITH PROPOFOL N/A 07/05/2016   Procedure: COLONOSCOPY WITH PROPOFOL;  Surgeon: Arta Silence, MD;  [facility];  Service: Endoscopy;  Laterality: N/A;   LEFT HEART CATH AND CORONARY ANGIOGRAPHY N/A 03/26/2017   Procedure: LEFT HEART CATH AND CORONARY ANGIOGRAPHY;  Surgeon: Nelva Bush, MD;  Location:  [facility];  Service: Cardiovascular;  Laterality: N/A;   Past Medical History:  Diagnosis Date   Abnormal nuclear cardiac imaging test 03/23/2017   Arthritis    Bilateral primary osteoarthritis of knee 06/05/2016   Cellulitis and abscess of trunk 04/30/2012   Chest pain 02/13/2017   Chronic pain of left  knee 10/31/2011   Significant medial compartment DJD bilaterally. Both knees had steroid injection done on October 31, 2011   Chronic pain syndrome 63/89/3734   Complication of anesthesia 1995   woke up on table right after gallbladder surgery   Constipation    Corneal ulcer 08/06/2012   Edema, lower extremity    Essential hypertension 02/13/2017   Family history of adverse reaction to anesthesia    sister coded twice with anesthesai not sure of details sister still living   Family history of breast cancer    Family history of uterine cancer    Gallbladder problem    Genetic testing 09/07/2016   Negative genetic testing on the Common Hereditary cancer panel.  The Hereditary Gene Panel offered by [facility] includes sequencing and/or deletion duplication testing of the following 46 genes: APC, ATM, AXIN2, BARD1, BMPR1A, BRCA1, BRCA2, BRIP1, CDH1, CDKN2A (p14ARF), CDKN2A (p16INK4a), CHEK2, CTNNA1, DICER1, EPCAM (Deletion/duplication testing only), GREM1 (promoter region deletion/duplication te   Headache    migraines   Joint pain    Knee pain    Leg pain    Migraine    Mixed dyslipidemia 02/13/2017   Morbid obesity ([place]) 02/13/2017   Osteoarthritis    Sleep apnea    SOBOE (shortness of breath on exertion)    BP 120/76   Pulse 68   SpO2 98%   Opioid Risk Score:   Fall Risk Score:  `1  Depression screen [facility] 2/9     10/04/2021   11:00 AM 07/04/2021   11:27 AM 04/12/2021   12:01 PM 04/05/2021    8:43 AM 08/21/2018   11:49 AM 05/21/2018   11:35 AM 04/23/2018    2:56 PM  Depression screen PHQ 2/9  Decreased Interest 0 0 0 3 0 0 0  Down, Depressed, Hopeless 0 0 0 1 0 0 0  PHQ - 2 Score 0 0 0 4 0 0 0  Altered sleeping    3     Tired, decreased energy    3     Change in appetite    1     Feeling bad or failure about yourself     1     Trouble concentrating    0     Moving slowly or fidgety/restless    0     Suicidal thoughts    0     PHQ-9 Score    12     Difficult doing work/chores     Very difficult  Review of Systems  Musculoskeletal:        Bilateral pelvic and knee pain  All other systems reviewed and are negative.     Objective:   Physical Exam Vitals and nursing note reviewed.  Constitutional:      Appearance: Normal appearance.  Cardiovascular:     Rate and Rhythm: Normal rate and regular rhythm.     Pulses: Normal pulses.     Heart sounds: Normal heart sounds.  Pulmonary:     Effort: Pulmonary effort is normal.     Breath sounds: Normal breath sounds.  Musculoskeletal:     Cervical back: Normal range of motion and neck supple.     Comments: Normal Muscle Bulk and Muscle Testing Reveals:  Upper Extremities: Full ROM and Muscle Strength 5/5 Bilateral Greater Trochanter Tenderness  Lower Extremities: Decreased ROM and Muscle Strength 5/5 Bilateral Lower Extremities Flexion Produces Pain into her Bilateral Patella's Arrived in Scooter   Skin:    General: Skin is warm and dry.  Neurological:     Mental Status: She is alert and oriented to person, place, and time.  Psychiatric:        Mood and Affect: Mood normal.        Behavior: Behavior normal.         Assessment & Plan:  1. 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 Continue HEP as Tolerated. 04/11/2022 Refilled: Belbuca 450 MCG Film inside cheek every 12 hours #60.  We will continue the opioid monitoring program, this consists of regular clinic visits, examinations, urine drug screen, pill counts as well as use of [place] Controlled Substance Reporting system. A 12 month History has been reviewed on the [place] on 04/11/2022. 2. Bilateral Hip osteoarthritis: Continue HEP as Tolerated. Continue to Monitor. 04/11/2022  3. Bilateral Greater Trochanteric Tenderness: Continue to Alternate Ice and Heat Therapy. Continue to Monitor. 04/11/2022 4. 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 Neurology Following.  Continue to Monitor. 04/11/2022   F/U in 3 months

## 2022-04-14 LAB — DRUG TOX MONITOR 1 W/CONF, ORAL FLD
Amphetamines: NEGATIVE ng/mL (ref <10)
Barbiturates: NEGATIVE ng/mL (ref <10)
Benzodiazepines: NEGATIVE ng/mL (ref <0.50)
Buprenorphine: >25 ng/mL — ABNORMAL HIGH (ref <0.10)
Buprenorphine: POSITIVE ng/mL — AB (ref <0.10)
Cocaine: NEGATIVE ng/mL (ref <5.0)
Fentanyl: NEGATIVE ng/mL (ref <0.10)
Heroin Metabolite: NEGATIVE ng/mL (ref <1.0)
MARIJUANA: NEGATIVE ng/mL (ref <2.5)
MDMA: NEGATIVE ng/mL (ref <10)
Meprobamate: NEGATIVE ng/mL (ref <2.5)
Methadone: NEGATIVE ng/mL (ref <5.0)
Naloxone: NEGATIVE ng/mL (ref <0.25)
Nicotine Metabolite: NEGATIVE ng/mL (ref <5.0)
Norbuprenorphine: 6.77 ng/mL — ABNORMAL HIGH (ref <0.50)
Opiates: NEGATIVE ng/mL (ref <2.5)
Phencyclidine: NEGATIVE ng/mL (ref <10)
Tapentadol: NEGATIVE ng/mL (ref <5.0)
Tramadol: NEGATIVE ng/mL (ref <5.0)
Zolpidem: NEGATIVE ng/mL (ref <5.0)

## 2022-04-14 LAB — DRUG TOX ALC METAB W/CON, ORAL FLD: Alcohol Metabolite: NEGATIVE ng/mL (ref <25)

## 2022-05-30 DIAGNOSIS — Z83719 Family history of colon polyps, unspecified: Secondary | ICD-10-CM | POA: Diagnosis not present

## 2022-05-30 DIAGNOSIS — Z1211 Encounter for screening for malignant neoplasm of colon: Secondary | ICD-10-CM | POA: Diagnosis not present

## 2022-05-30 DIAGNOSIS — K5730 Diverticulosis of large intestine without perforation or abscess without bleeding: Secondary | ICD-10-CM | POA: Diagnosis not present

## 2022-06-07 ENCOUNTER — Other Ambulatory Visit (HOSPITAL_COMMUNITY): Payer: Self-pay

## 2022-06-09 ENCOUNTER — Other Ambulatory Visit (HOSPITAL_COMMUNITY): Payer: Self-pay

## 2022-06-10 ENCOUNTER — Other Ambulatory Visit (HOSPITAL_COMMUNITY): Payer: Self-pay

## 2022-06-12 ENCOUNTER — Other Ambulatory Visit (HOSPITAL_COMMUNITY): Payer: Self-pay

## 2022-06-12 MED ORDER — CLOBETASOL PROPIONATE 0.05 % EX SOLN
CUTANEOUS | 2 refills | Status: DC
  Filled 2022-06-12 – 2022-06-20 (×4): qty 50, 30d supply, fill #0
  Filled 2022-07-18: qty 50, 30d supply, fill #1

## 2022-06-12 MED ORDER — TROSPIUM CHLORIDE ER 60 MG PO CP24
60.0000 mg | ORAL_CAPSULE | Freq: Every day | ORAL | 3 refills | Status: DC
  Filled 2022-06-12 – 2022-08-24 (×6): qty 90, 90d supply, fill #0

## 2022-06-13 ENCOUNTER — Other Ambulatory Visit (HOSPITAL_COMMUNITY): Payer: Self-pay

## 2022-06-13 ENCOUNTER — Other Ambulatory Visit: Payer: Self-pay

## 2022-06-13 MED ORDER — NORTRIPTYLINE HCL 50 MG PO CAPS
50.0000 mg | ORAL_CAPSULE | Freq: Every evening | ORAL | 1 refills | Status: DC
  Filled 2022-06-13: qty 100, 100d supply, fill #0
  Filled 2022-09-03 – 2022-09-04 (×2): qty 100, 100d supply, fill #1
  Filled 2022-12-13: qty 100, 100d supply, fill #2

## 2022-06-13 MED ORDER — FLUCONAZOLE 150 MG PO TABS
150.0000 mg | ORAL_TABLET | ORAL | 0 refills | Status: DC
  Filled 2022-06-13: qty 2, 14d supply, fill #0

## 2022-06-13 MED ORDER — DEXCOM G6 RECEIVER DEVI
0 refills | Status: DC
  Filled 2022-06-13: qty 1, 90d supply, fill #0

## 2022-06-13 MED ORDER — ATORVASTATIN CALCIUM 40 MG PO TABS
40.0000 mg | ORAL_TABLET | Freq: Every day | ORAL | 1 refills | Status: DC
  Filled 2022-06-13: qty 90, 90d supply, fill #0

## 2022-06-14 ENCOUNTER — Other Ambulatory Visit: Payer: Self-pay

## 2022-06-20 ENCOUNTER — Other Ambulatory Visit (HOSPITAL_COMMUNITY): Payer: Self-pay

## 2022-06-20 ENCOUNTER — Other Ambulatory Visit: Payer: Self-pay

## 2022-06-22 ENCOUNTER — Other Ambulatory Visit (HOSPITAL_COMMUNITY): Payer: Self-pay

## 2022-06-23 ENCOUNTER — Other Ambulatory Visit (HOSPITAL_COMMUNITY): Payer: Self-pay

## 2022-06-23 ENCOUNTER — Ambulatory Visit
Admission: RE | Admit: 2022-06-23 | Discharge: 2022-06-23 | Disposition: A | Discharge status: Home / Self Care | Payer: HMO | Source: Ambulatory Visit | Attending: Obstetrics and Gynecology | Admitting: Obstetrics and Gynecology

## 2022-06-23 DIAGNOSIS — Z1231 Encounter for screening mammogram for malignant neoplasm of breast: Secondary | ICD-10-CM

## 2022-06-23 MED ORDER — SPIRONOLACTONE 100 MG PO TABS
100.0000 mg | ORAL_TABLET | Freq: Every day | ORAL | 0 refills | Status: DC
  Filled 2022-06-23: qty 30, 30d supply, fill #0

## 2022-06-26 ENCOUNTER — Other Ambulatory Visit (HOSPITAL_COMMUNITY): Payer: Self-pay

## 2022-06-26 ENCOUNTER — Other Ambulatory Visit: Payer: Self-pay

## 2022-06-27 ENCOUNTER — Other Ambulatory Visit: Payer: Self-pay | Admitting: Obstetrics and Gynecology

## 2022-06-27 DIAGNOSIS — R928 Other abnormal and inconclusive findings on diagnostic imaging of breast: Secondary | ICD-10-CM

## 2022-06-28 ENCOUNTER — Other Ambulatory Visit (HOSPITAL_COMMUNITY): Payer: Self-pay

## 2022-07-03 ENCOUNTER — Other Ambulatory Visit (HOSPITAL_COMMUNITY): Payer: Self-pay

## 2022-07-04 ENCOUNTER — Other Ambulatory Visit (HOSPITAL_COMMUNITY): Payer: Self-pay

## 2022-07-05 ENCOUNTER — Other Ambulatory Visit: Payer: Self-pay

## 2022-07-06 ENCOUNTER — Other Ambulatory Visit (HOSPITAL_COMMUNITY): Payer: Self-pay

## 2022-07-07 ENCOUNTER — Other Ambulatory Visit (HOSPITAL_COMMUNITY): Payer: Self-pay

## 2022-07-07 ENCOUNTER — Other Ambulatory Visit: Payer: Self-pay

## 2022-07-07 MED ORDER — ZOLPIDEM TARTRATE 10 MG PO TABS
10.0000 mg | ORAL_TABLET | Freq: Every evening | ORAL | 0 refills | Status: DC | PRN
  Filled 2022-07-07 (×2): qty 30, 30d supply, fill #0

## 2022-07-10 ENCOUNTER — Ambulatory Visit
Admission: RE | Admit: 2022-07-10 | Discharge: 2022-07-10 | Disposition: A | Discharge status: Home / Self Care | Payer: HMO | Source: Ambulatory Visit | Attending: Obstetrics and Gynecology | Admitting: Obstetrics and Gynecology

## 2022-07-10 ENCOUNTER — Ambulatory Visit: Payer: HMO

## 2022-07-10 DIAGNOSIS — R928 Other abnormal and inconclusive findings on diagnostic imaging of breast: Secondary | ICD-10-CM

## 2022-07-10 DIAGNOSIS — N6489 Other specified disorders of breast: Secondary | ICD-10-CM | POA: Diagnosis not present

## 2022-07-11 ENCOUNTER — Encounter: Payer: Self-pay | Admitting: Registered Nurse

## 2022-07-11 ENCOUNTER — Other Ambulatory Visit (HOSPITAL_COMMUNITY): Payer: Self-pay

## 2022-07-11 ENCOUNTER — Other Ambulatory Visit: Payer: Self-pay

## 2022-07-11 ENCOUNTER — Encounter: Payer: 59 | Attending: Registered Nurse | Admitting: Registered Nurse

## 2022-07-11 VITALS — BP 129/76 | HR 74 | Ht <= 58 in | Wt 215.0 lb

## 2022-07-11 DIAGNOSIS — M170 Bilateral primary osteoarthritis of knee: Secondary | ICD-10-CM | POA: Insufficient documentation

## 2022-07-11 DIAGNOSIS — M7061 Trochanteric bursitis, right hip: Secondary | ICD-10-CM | POA: Insufficient documentation

## 2022-07-11 DIAGNOSIS — M7062 Trochanteric bursitis, left hip: Secondary | ICD-10-CM | POA: Diagnosis present

## 2022-07-11 DIAGNOSIS — Z79891 Long term (current) use of opiate analgesic: Secondary | ICD-10-CM | POA: Insufficient documentation

## 2022-07-11 DIAGNOSIS — G894 Chronic pain syndrome: Secondary | ICD-10-CM | POA: Diagnosis not present

## 2022-07-11 DIAGNOSIS — Z5181 Encounter for therapeutic drug level monitoring: Secondary | ICD-10-CM | POA: Diagnosis not present

## 2022-07-11 MED ORDER — BELBUCA 450 MCG BU FILM
1.0000 | ORAL_FILM | Freq: Two times a day (BID) | BUCCAL | 1 refills | Status: DC
  Filled 2022-07-11: qty 60, fill #0
  Filled 2022-07-15 – 2022-07-27 (×3): qty 60, 30d supply, fill #0
  Filled 2022-08-18 – 2022-08-31 (×2): qty 60, 30d supply, fill #1

## 2022-07-11 NOTE — Progress Notes (Signed)
Subjective:    Patient ID: Anita Booker, female    DOB: Feb 07, 1961, 62 y.o.   MRN: [id number]  HPI: Anita Booker is a 62 y.o. female who returns for follow up appointment for chronic pain and medication refill. She states her pain is located in her bilateral hips and bilateral knee pain. She rates her pain 6. Her current exercise regime is walking with her cane to the bathroom otherwise she is in her scooter.    Last Oral Swab was Performed on 04/11/2022, it was consistent.    Pain Inventory Average Pain 6 Pain Right Now 6 My pain is sharp, burning, stabbing, and aching  In the last 24 hours, has pain interfered with the following? General activity 7 Relation with others 6 Enjoyment of life 7 What TIME of day is your pain at its worst? evening and night Sleep (in general) Fair  Pain is worse with: walking and standing Pain improves with: rest, heat/ice, pacing activities, and medication Relief from Meds: 5  Family History  Problem Relation Age of Onset   Cancer Mother    Thyroid disease Mother    Stroke Mother    Hypertension Mother    Diabetes Mother    Uterine cancer Mother 20   Obesity Mother    Obesity Father    Hypertension Father    Lung cancer Father 26   Cancer Father        lung   Breast cancer Sister 77       identical twin   Healthy Sister    Healthy Brother    Heart attack Brother 48   Cancer Paternal Grandmother        breast   Lung cancer Paternal Uncle        all four uncles had lung cancer and were smokers   Breast cancer Cousin        paternal cousin dx in her 23s   Liver cancer Cousin        paternal cousin with hepatitis and liver cancer   Breast cancer Other    Social History   Socioeconomic History   Marital status: Married    Spouse name: Wonda Horner   Number of children: 5   Years of education: Not on file   Highest education level: Not on file  Occupational History   Occupation: RN  Tobacco Use    Smoking status: Never   Smokeless tobacco: Never  Vaping Use   Vaping Use: Never used  Substance and Sexual Activity   Alcohol use: No   Drug use: No   Sexual activity: Not on file  Other Topics Concern   Not on file  Social History Narrative   Right handed   Social Determinants of Health   Financial Resource Strain: Not on file  Food Insecurity: Not on file  Transportation Needs: Not on file  Physical Activity: Not on file  Stress: Not on file  Social Connections: Not on file   Past Surgical History:  Procedure Laterality Date   BREAST EXCISIONAL BIOPSY Right    infection to right breast 5 years ago.   BREAST SURGERY  07/01/2011.   I & D of right breast abscess   CHOLECYSTECTOMY  1995   COLONOSCOPY WITH PROPOFOL N/A 07/05/2016   Procedure: COLONOSCOPY WITH PROPOFOL;  Surgeon: Willis Modena, MD;  Location: [facility];  Service: Endoscopy;  Laterality: N/A;   LEFT HEART CATH AND CORONARY ANGIOGRAPHY N/A 03/26/2017   Procedure: LEFT  HEART CATH AND CORONARY ANGIOGRAPHY;  Surgeon: Yvonne Kendall, MD;  Location: [facility];  Service: Cardiovascular;  Laterality: N/A;   Past Surgical History:  Procedure Laterality Date   BREAST EXCISIONAL BIOPSY Right    infection to right breast 5 years ago.   BREAST SURGERY  07/01/2011.   I & D of right breast abscess   CHOLECYSTECTOMY  1995   COLONOSCOPY WITH PROPOFOL N/A 07/05/2016   Procedure: COLONOSCOPY WITH PROPOFOL;  Surgeon: Willis Modena, MD;  Location: [facility];  Service: Endoscopy;  Laterality: N/A;   LEFT HEART CATH AND CORONARY ANGIOGRAPHY N/A 03/26/2017   Procedure: LEFT HEART CATH AND CORONARY ANGIOGRAPHY;  Surgeon: Yvonne Kendall, MD;  Location: [facility];  Service: Cardiovascular;  Laterality: N/A;   Past Medical History:  Diagnosis Date   Abnormal nuclear cardiac imaging test 03/23/2017   Arthritis    Bilateral primary osteoarthritis of knee 06/05/2016   Cellulitis and abscess of trunk 04/30/2012    Chest pain 02/13/2017   Chronic pain of left knee 10/31/2011   Significant medial compartment DJD bilaterally. Both knees had steroid injection done on October 31, 2011   Chronic pain syndrome 09/28/2016   Complication of anesthesia 1995   woke up on table right after gallbladder surgery   Constipation    Corneal ulcer 08/06/2012   Edema, lower extremity    Essential hypertension 02/13/2017   Family history of adverse reaction to anesthesia    sister coded twice with anesthesai not sure of details sister still living   Family history of breast cancer    Family history of uterine cancer    Gallbladder problem    Genetic testing 09/07/2016   Negative genetic testing on the Common Hereditary cancer panel.  The Hereditary Gene Panel offered by [facility] includes sequencing and/or deletion duplication testing of the following 46 genes: APC, ATM, AXIN2, BARD1, BMPR1A, BRCA1, BRCA2, BRIP1, CDH1, CDKN2A (p14ARF), CDKN2A (p16INK4a), CHEK2, CTNNA1, DICER1, EPCAM (Deletion/duplication testing only), GREM1 (promoter region deletion/duplication te   Headache    migraines   Joint pain    Knee pain    Leg pain    Migraine    Mixed dyslipidemia 02/13/2017   Morbid obesity 02/13/2017   Osteoarthritis    Sleep apnea    SOBOE (shortness of breath on exertion)    There were no vitals taken for this visit.  Opioid Risk Score:   Fall Risk Score:  `1  Depression screen [facility] 2/9     10/04/2021   11:00 AM 07/04/2021   11:27 AM 04/12/2021   12:01 PM 04/05/2021    8:43 AM 08/21/2018   11:49 AM 05/21/2018   11:35 AM 04/23/2018    2:56 PM  Depression screen PHQ 2/9  Decreased Interest 0 0 0 3 0 0 0  Down, Depressed, Hopeless 0 0 0 1 0 0 0  PHQ - 2 Score 0 0 0 4 0 0 0  Altered sleeping    3     Tired, decreased energy    3     Change in appetite    1     Feeling bad or failure about yourself     1     Trouble concentrating    0     Moving slowly or fidgety/restless    0     Suicidal thoughts    0      PHQ-9 Score    12     Difficult doing work/chores  Very difficult         Review of Systems  Musculoskeletal:  Positive for gait problem.       B/L KNEE PAIN   All other systems reviewed and are negative.     Objective:   Physical Exam Vitals and nursing note reviewed.  Constitutional:      Appearance: Normal appearance.  Cardiovascular:     Rate and Rhythm: Normal rate and regular rhythm.     Pulses: Normal pulses.     Heart sounds: Normal heart sounds.  Pulmonary:     Effort: Pulmonary effort is normal.     Breath sounds: Normal breath sounds.  Musculoskeletal:     Cervical back: Normal range of motion and neck supple.     Comments: Normal Muscle Bulk and Muscle Testing Reveals:  Upper Extremities: Full ROM and Muscle Strength 5/5 Bilateral Greater Trochanteric Tenderness Lower Extremities: Decreased ROM and Muscle Strength 5/5 Bilateral Lower Extremities Flexion Produces Pain into her Bilateral Patella's Arrived in her scooter    Skin:    General: Skin is warm and dry.  Neurological:     Mental Status: She is alert and oriented to person, place, and time.  Psychiatric:        Mood and Affect: Mood normal.        Behavior: Behavior normal.         Assessment & Plan:  1. 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 Continue HEP as Tolerated. 07/11/2022 Refilled: Bel[place]uca 450 MCG Film inside cheek every 12 hours #60.  We will continue the opioid monitoring program, this consists of regular clinic visits, examinations, urine drug screen, pill counts as well as use of [place] Controlled Substance Reporting system. A 12 month History has been reviewed on the [place] Controlled Substance Reporting System on 07/11/2022. 2. Bilateral Hip osteoarthritis: Continue HEP as Tolerated. Continue to Monitor. 07/11/2022  3. Bilateral Greater Trochanteric Tenderness: Continue to Alternate Ice and Heat Therapy. Continue to Monitor. 07/11/2022 4.  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 Neurology Following. Continue to Monitor. 07/11/2022   F/U in 3 months

## 2022-07-15 ENCOUNTER — Other Ambulatory Visit (HOSPITAL_COMMUNITY): Payer: Self-pay

## 2022-07-17 ENCOUNTER — Other Ambulatory Visit (HOSPITAL_COMMUNITY): Payer: Self-pay

## 2022-07-18 ENCOUNTER — Telehealth: Payer: Self-pay | Admitting: *Deleted

## 2022-07-18 ENCOUNTER — Other Ambulatory Visit (HOSPITAL_COMMUNITY): Payer: Self-pay

## 2022-07-18 ENCOUNTER — Encounter: Payer: Self-pay | Admitting: Pharmacist

## 2022-07-18 ENCOUNTER — Other Ambulatory Visit: Payer: Self-pay

## 2022-07-18 MED ORDER — WEGOVY 0.5 MG/0.5ML ~~LOC~~ SOAJ
0.5000 mg | SUBCUTANEOUS | 0 refills | Status: DC
  Filled 2022-07-18 – 2022-07-20 (×3): qty 2, 28d supply, fill #0

## 2022-07-18 MED ORDER — PHENTERMINE HCL 37.5 MG PO TABS
ORAL_TABLET | ORAL | 0 refills | Status: DC
  Filled 2022-07-18: qty 45, 30d supply, fill #0

## 2022-07-18 NOTE — Telephone Encounter (Signed)
PA initiated on covermymeds  (Key: [id number])

## 2022-07-19 ENCOUNTER — Other Ambulatory Visit: Payer: Self-pay

## 2022-07-20 ENCOUNTER — Other Ambulatory Visit (HOSPITAL_COMMUNITY): Payer: Self-pay

## 2022-07-20 MED ORDER — WEGOVY 1 MG/0.5ML ~~LOC~~ SOAJ
1.0000 mg | SUBCUTANEOUS | 0 refills | Status: DC
  Filled 2022-07-20 – 2022-08-07 (×2): qty 2, 28d supply, fill #0

## 2022-07-20 MED ORDER — SPIRONOLACTONE 100 MG PO TABS
100.0000 mg | ORAL_TABLET | Freq: Every day | ORAL | 3 refills | Status: DC
  Filled 2022-07-20: qty 90, 90d supply, fill #0
  Filled 2022-09-19 – 2022-10-24 (×2): qty 90, 90d supply, fill #1
  Filled 2023-02-02: qty 90, 90d supply, fill #2
  Filled 2023-04-16: qty 90, 90d supply, fill #3

## 2022-07-21 ENCOUNTER — Other Ambulatory Visit (HOSPITAL_COMMUNITY): Payer: Self-pay

## 2022-07-21 NOTE — Telephone Encounter (Signed)
Anita Booker (Key: [id number]) PA Case ID #: [id number] Rx #: [id number] Need Help? Call us at [phone] Outcome Denied on April 30 Plan requirements for the approval of a non-formulary medication are: Diagnosis of a medically-accepted indication, AND; The patient has tried and failed at least one formulary alternative, OR; The prescriber can submit a supporting s Drug Belbuca films ePA cloud logo Form [facility] [employer] Electronic Prior Authorization Form 2017 NCP[employer]

## 2022-07-24 ENCOUNTER — Other Ambulatory Visit (HOSPITAL_COMMUNITY): Payer: Self-pay

## 2022-07-25 ENCOUNTER — Other Ambulatory Visit: Payer: Self-pay

## 2022-07-25 ENCOUNTER — Other Ambulatory Visit (HOSPITAL_COMMUNITY): Payer: Self-pay

## 2022-07-25 MED ORDER — ZOLPIDEM TARTRATE 10 MG PO TABS
10.0000 mg | ORAL_TABLET | Freq: Every evening | ORAL | 0 refills | Status: DC
  Filled 2022-07-25 – 2022-07-27 (×2): qty 30, 30d supply, fill #0

## 2022-07-25 NOTE — Telephone Encounter (Signed)
Appeal faxed today for [place] film

## 2022-07-27 ENCOUNTER — Other Ambulatory Visit (HOSPITAL_COMMUNITY): Payer: Self-pay

## 2022-07-27 ENCOUNTER — Other Ambulatory Visit: Payer: Self-pay

## 2022-07-27 NOTE — Telephone Encounter (Signed)
Approved 07/26/22-03/20/23

## 2022-07-27 NOTE — Telephone Encounter (Signed)
Appeal submitted

## 2022-07-28 ENCOUNTER — Other Ambulatory Visit: Payer: Self-pay

## 2022-07-28 ENCOUNTER — Other Ambulatory Visit (HOSPITAL_COMMUNITY): Payer: Self-pay

## 2022-08-02 NOTE — Telephone Encounter (Signed)
approved

## 2022-08-07 ENCOUNTER — Other Ambulatory Visit (HOSPITAL_COMMUNITY): Payer: Self-pay

## 2022-08-11 ENCOUNTER — Other Ambulatory Visit (HOSPITAL_COMMUNITY): Payer: Self-pay

## 2022-08-11 ENCOUNTER — Other Ambulatory Visit: Payer: Self-pay

## 2022-08-15 ENCOUNTER — Encounter: Payer: Self-pay | Admitting: Pharmacist

## 2022-08-15 ENCOUNTER — Other Ambulatory Visit (HOSPITAL_COMMUNITY): Payer: Self-pay

## 2022-08-15 ENCOUNTER — Other Ambulatory Visit: Payer: Self-pay

## 2022-08-15 MED ORDER — PHENTERMINE HCL 37.5 MG PO TABS
ORAL_TABLET | ORAL | 0 refills | Status: DC
  Filled 2022-08-15: qty 45, 30d supply, fill #0

## 2022-08-15 MED ORDER — ZOLPIDEM TARTRATE 10 MG PO TABS
10.0000 mg | ORAL_TABLET | Freq: Every evening | ORAL | 0 refills | Status: DC | PRN
  Filled 2022-08-15: qty 30, 30d supply, fill #0

## 2022-08-16 ENCOUNTER — Other Ambulatory Visit: Payer: Self-pay

## 2022-08-17 ENCOUNTER — Other Ambulatory Visit (HOSPITAL_COMMUNITY): Payer: Self-pay

## 2022-08-18 ENCOUNTER — Other Ambulatory Visit: Payer: Self-pay

## 2022-08-18 ENCOUNTER — Other Ambulatory Visit (HOSPITAL_COMMUNITY): Payer: Self-pay

## 2022-08-24 ENCOUNTER — Other Ambulatory Visit (HOSPITAL_COMMUNITY): Payer: Self-pay

## 2022-08-26 ENCOUNTER — Other Ambulatory Visit (HOSPITAL_COMMUNITY): Payer: Self-pay

## 2022-08-26 MED ORDER — VALSARTAN 320 MG PO TABS
320.0000 mg | ORAL_TABLET | Freq: Every day | ORAL | 0 refills | Status: DC
  Filled 2022-08-26: qty 90, 90d supply, fill #0

## 2022-08-26 MED ORDER — CELECOXIB 200 MG PO CAPS
200.0000 mg | ORAL_CAPSULE | Freq: Every day | ORAL | 1 refills | Status: DC
  Filled 2022-08-26: qty 90, 90d supply, fill #0
  Filled 2022-12-13: qty 90, 90d supply, fill #1

## 2022-08-28 ENCOUNTER — Other Ambulatory Visit: Payer: Self-pay

## 2022-08-31 ENCOUNTER — Other Ambulatory Visit (HOSPITAL_COMMUNITY): Payer: Self-pay

## 2022-09-01 ENCOUNTER — Other Ambulatory Visit (HOSPITAL_COMMUNITY): Payer: Self-pay

## 2022-09-01 ENCOUNTER — Other Ambulatory Visit: Payer: Self-pay

## 2022-09-01 DIAGNOSIS — N3946 Mixed incontinence: Secondary | ICD-10-CM | POA: Diagnosis not present

## 2022-09-01 DIAGNOSIS — R351 Nocturia: Secondary | ICD-10-CM | POA: Diagnosis not present

## 2022-09-01 MED ORDER — MIRABEGRON ER 50 MG PO TB24
50.0000 mg | ORAL_TABLET | Freq: Every day | ORAL | 3 refills | Status: DC
  Filled 2022-09-01 (×2): qty 90, 90d supply, fill #0
  Filled 2022-09-14: qty 30, 30d supply, fill #0
  Filled 2022-09-14 – 2023-04-16 (×4): qty 90, 90d supply, fill #0

## 2022-09-02 ENCOUNTER — Other Ambulatory Visit (HOSPITAL_COMMUNITY): Payer: Self-pay

## 2022-09-04 ENCOUNTER — Other Ambulatory Visit (HOSPITAL_COMMUNITY): Payer: Self-pay

## 2022-09-07 ENCOUNTER — Other Ambulatory Visit: Payer: Self-pay

## 2022-09-07 ENCOUNTER — Other Ambulatory Visit (HOSPITAL_COMMUNITY): Payer: Self-pay

## 2022-09-07 MED ORDER — TOPIRAMATE 25 MG PO TABS
25.0000 mg | ORAL_TABLET | Freq: Every day | ORAL | 0 refills | Status: DC
  Filled 2022-09-07: qty 30, 30d supply, fill #0

## 2022-09-08 ENCOUNTER — Other Ambulatory Visit (HOSPITAL_COMMUNITY): Payer: Self-pay

## 2022-09-08 MED ORDER — PHENTERMINE HCL 37.5 MG PO TABS
ORAL_TABLET | ORAL | 0 refills | Status: DC
  Filled 2022-09-19: qty 45, 30d supply, fill #0

## 2022-09-14 ENCOUNTER — Other Ambulatory Visit (HOSPITAL_COMMUNITY): Payer: Self-pay

## 2022-09-19 ENCOUNTER — Other Ambulatory Visit (HOSPITAL_COMMUNITY): Payer: Self-pay

## 2022-09-19 ENCOUNTER — Other Ambulatory Visit: Payer: Self-pay

## 2022-09-20 ENCOUNTER — Other Ambulatory Visit: Payer: Self-pay

## 2022-09-20 ENCOUNTER — Other Ambulatory Visit (HOSPITAL_COMMUNITY): Payer: Self-pay

## 2022-09-20 MED ORDER — ALBUTEROL SULFATE HFA 108 (90 BASE) MCG/ACT IN AERS
1.0000 | INHALATION_SPRAY | RESPIRATORY_TRACT | 1 refills | Status: DC | PRN
  Filled 2022-09-20: qty 6.7, 33d supply, fill #0

## 2022-09-20 MED ORDER — FUROSEMIDE 20 MG PO TABS
20.0000 mg | ORAL_TABLET | Freq: Every day | ORAL | 1 refills | Status: DC | PRN
  Filled 2022-09-20: qty 90, 90d supply, fill #0

## 2022-09-20 MED ORDER — VALSARTAN 320 MG PO TABS
320.0000 mg | ORAL_TABLET | Freq: Every day | ORAL | 3 refills | Status: DC
  Filled 2022-09-20 – 2023-01-15 (×2): qty 90, 90d supply, fill #0

## 2022-09-20 MED ORDER — NORTRIPTYLINE HCL 50 MG PO CAPS
100.0000 mg | ORAL_CAPSULE | Freq: Every day | ORAL | 3 refills | Status: DC
  Filled 2022-09-20 – 2023-03-07 (×6): qty 180, 90d supply, fill #0
  Filled ????-??-??: fill #0

## 2022-09-20 MED ORDER — CELECOXIB 200 MG PO CAPS
200.0000 mg | ORAL_CAPSULE | Freq: Every day | ORAL | 3 refills | Status: DC
  Filled 2022-09-20 – 2023-03-07 (×2): qty 90, 90d supply, fill #0

## 2022-09-20 MED ORDER — ZOLPIDEM TARTRATE 10 MG PO TABS
10.0000 mg | ORAL_TABLET | Freq: Every evening | ORAL | 3 refills | Status: DC | PRN
  Filled 2022-09-20: qty 30, 30d supply, fill #0
  Filled 2022-10-24: qty 30, 30d supply, fill #1
  Filled 2022-12-11 – 2022-12-14 (×3): qty 30, 30d supply, fill #2
  Filled 2023-01-22: qty 30, 30d supply, fill #3

## 2022-09-20 MED ORDER — ATORVASTATIN CALCIUM 40 MG PO TABS
40.0000 mg | ORAL_TABLET | Freq: Every day | ORAL | 3 refills | Status: DC
  Filled 2022-09-20: qty 90, 90d supply, fill #0

## 2022-09-20 MED ORDER — ZOLPIDEM TARTRATE 10 MG PO TABS
10.0000 mg | ORAL_TABLET | Freq: Every evening | ORAL | 0 refills | Status: DC | PRN
  Filled 2022-11-16: qty 30, 30d supply, fill #0

## 2022-09-20 MED ORDER — HYDROXYZINE HCL 25 MG PO TABS
25.0000 mg | ORAL_TABLET | Freq: Three times a day (TID) | ORAL | 3 refills | Status: DC | PRN
  Filled 2022-09-20: qty 90, 30d supply, fill #0
  Filled 2023-03-02: qty 90, 30d supply, fill #1

## 2022-09-22 ENCOUNTER — Other Ambulatory Visit: Payer: Self-pay

## 2022-09-27 ENCOUNTER — Other Ambulatory Visit (HOSPITAL_COMMUNITY): Payer: Self-pay

## 2022-10-06 ENCOUNTER — Other Ambulatory Visit: Payer: Self-pay | Admitting: Registered Nurse

## 2022-10-06 ENCOUNTER — Other Ambulatory Visit (HOSPITAL_COMMUNITY): Payer: Self-pay

## 2022-10-06 MED ORDER — BELBUCA 450 MCG BU FILM
1.0000 | ORAL_FILM | Freq: Two times a day (BID) | BUCCAL | 1 refills | Status: DC
  Filled 2022-10-06: qty 60, 30d supply, fill #0

## 2022-10-07 ENCOUNTER — Other Ambulatory Visit (HOSPITAL_COMMUNITY): Payer: Self-pay

## 2022-10-09 ENCOUNTER — Other Ambulatory Visit: Payer: Self-pay

## 2022-10-09 ENCOUNTER — Other Ambulatory Visit (HOSPITAL_COMMUNITY): Payer: Self-pay

## 2022-10-10 ENCOUNTER — Other Ambulatory Visit (HOSPITAL_COMMUNITY): Payer: Self-pay

## 2022-10-10 MED ORDER — PHENTERMINE HCL 37.5 MG PO TABS
ORAL_TABLET | ORAL | 0 refills | Status: DC
  Filled 2022-10-10: qty 45, 45d supply, fill #0
  Filled 2022-10-21 – 2022-11-27 (×3): qty 45, 30d supply, fill #0

## 2022-10-11 ENCOUNTER — Other Ambulatory Visit: Payer: Self-pay

## 2022-10-12 ENCOUNTER — Other Ambulatory Visit: Payer: Self-pay

## 2022-10-12 ENCOUNTER — Encounter: Payer: HMO | Attending: Registered Nurse | Admitting: Registered Nurse

## 2022-10-12 ENCOUNTER — Encounter: Payer: Self-pay | Admitting: Registered Nurse

## 2022-10-12 ENCOUNTER — Other Ambulatory Visit (HOSPITAL_COMMUNITY): Payer: Self-pay

## 2022-10-12 VITALS — BP 111/75 | HR 70

## 2022-10-12 DIAGNOSIS — G894 Chronic pain syndrome: Secondary | ICD-10-CM | POA: Insufficient documentation

## 2022-10-12 DIAGNOSIS — M7061 Trochanteric bursitis, right hip: Secondary | ICD-10-CM | POA: Insufficient documentation

## 2022-10-12 DIAGNOSIS — M170 Bilateral primary osteoarthritis of knee: Secondary | ICD-10-CM | POA: Insufficient documentation

## 2022-10-12 DIAGNOSIS — M7062 Trochanteric bursitis, left hip: Secondary | ICD-10-CM | POA: Insufficient documentation

## 2022-10-12 DIAGNOSIS — Z5181 Encounter for therapeutic drug level monitoring: Secondary | ICD-10-CM | POA: Diagnosis present

## 2022-10-12 DIAGNOSIS — Z79891 Long term (current) use of opiate analgesic: Secondary | ICD-10-CM | POA: Diagnosis present

## 2022-10-12 MED ORDER — BELBUCA 450 MCG BU FILM
1.0000 | ORAL_FILM | Freq: Two times a day (BID) | BUCCAL | 1 refills | Status: DC
  Filled 2022-10-12 – 2022-11-16 (×2): qty 60, 30d supply, fill #0

## 2022-10-12 NOTE — Progress Notes (Signed)
Subjective:    Patient ID: Anita Booker, female    DOB: [birth year], 62 y.o.   MRN: [id number]  HPI: Anita Booker is a 62 y.o. female who returns for follow up appointment for chronic pain and medication refill. She states  her pain is located in her bilateral hips and bilateral knee pain. She rates her pain 5. Her current exercise regime is walking short distances in her home, using elliptical for 10 minutes twice a day and performing stretching exercises.  Oral Swab was ordered today.     Pain Inventory Average Pain 7 Pain Right Now 5 My pain is sharp, burning, stabbing, and aching  In the last 24 hours, has pain interfered with the following? General activity 7 Relation with others 6 Enjoyment of life 6 What TIME of day is your pain at its worst? evening and night Sleep (in general) Fair  Pain is worse with: walking, standing, and some activites Pain improves with: rest, heat/ice, pacing activities, and medication Relief from Meds: 6  Family History  Problem Relation Age of Onset   Cancer Mother    Thyroid disease Mother    Stroke Mother    Hypertension Mother    Diabetes Mother    Uterine cancer Mother 51   Obesity Mother    Obesity Father    Hypertension Father    Lung cancer Father 37   Cancer Father        lung   Breast cancer Sister 7       identical twin   Healthy Sister    Healthy Brother    Heart attack Brother 48   Cancer Paternal Grandmother        breast   Lung cancer Paternal Uncle        all four uncles had lung cancer and were smokers   Breast cancer Cousin        paternal cousin dx in her 37s   Liver cancer Cousin        paternal cousin with hepatitis and liver cancer   Breast cancer Other    Social History   Socioeconomic History   Marital status: Married    Spouse name: Wonda Horner   Number of children: 5   Years of education: Not on file   Highest education level: Not on file  Occupational History    Occupation: RN  Tobacco Use   Smoking status: Never   Smokeless tobacco: Never  Vaping Use   Vaping status: Never Used  Substance and Sexual Activity   Alcohol use: No   Drug use: No   Sexual activity: Not on file  Other Topics Concern   Not on file  Social History Narrative   Right handed   Social Determinants of Health   Financial Resource Strain: Not on file  Food Insecurity: Not on file  Transportation Needs: Not on file  Physical Activity: Not on file  Stress: Not on file  Social Connections: Unknown (06/27/2022)   Received from [facility], [facility]   Social Network    Social Network: Not on file   Past Surgical History:  Procedure Laterality Date   BREAST EXCISIONAL BIOPSY Right    infection to right breast 5 years ago.   BREAST SURGERY  07/01/2011.   I & D of right breast abscess   CHOLECYSTECTOMY  1995   COLONOSCOPY WITH PROPOFOL N/A 07/05/2016   Procedure: COLONOSCOPY WITH PROPOFOL;  Surgeon: Willis Modena, MD;  Location: [facility];  Service: Endoscopy;  Laterality: N/A;   LEFT HEART CATH AND CORONARY ANGIOGRAPHY N/A 03/26/2017   Procedure: LEFT HEART CATH AND CORONARY ANGIOGRAPHY;  Surgeon: Yvonne Kendall, MD;  Location: [facility];  Service: Cardiovascular;  Laterality: N/A;   Past Surgical History:  Procedure Laterality Date   BREAST EXCISIONAL BIOPSY Right    infection to right breast 5 years ago.   BREAST SURGERY  07/01/2011.   I & D of right breast abscess   CHOLECYSTECTOMY  1995   COLONOSCOPY WITH PROPOFOL N/A 07/05/2016   Procedure: COLONOSCOPY WITH PROPOFOL;  Surgeon: Willis Modena, MD;  Location: [facility];  Service: Endoscopy;  Laterality: N/A;   LEFT HEART CATH AND CORONARY ANGIOGRAPHY N/A 03/26/2017   Procedure: LEFT HEART CATH AND CORONARY ANGIOGRAPHY;  Surgeon: Yvonne Kendall, MD;  Location: [facility];  Service: Cardiovascular;  Laterality: N/A;   Past Medical History:  Diagnosis Date   Abnormal nuclear cardiac  imaging test 03/23/2017   Arthritis    Bilateral primary osteoarthritis of knee 06/05/2016   Cellulitis and abscess of trunk 04/30/2012   Chest pain 02/13/2017   Chronic pain of left knee 10/31/2011   Significant medial compartment DJD bilaterally. Both knees had steroid injection done on October 31, 2011   Chronic pain syndrome 09/28/2016   Complication of anesthesia 1995   woke up on table right after gallbladder surgery   Constipation    Corneal ulcer 08/06/2012   Edema, lower extremity    Essential hypertension 02/13/2017   Family history of adverse reaction to anesthesia    sister coded twice with anesthesai not sure of details sister still living   Family history of breast cancer    Family history of uterine cancer    Gallbladder problem    Genetic testing 09/07/2016   Negative genetic testing on the Common Hereditary cancer panel.  The Hereditary Gene Panel offered by [facility] includes sequencing and/or deletion duplication testing of the following 46 genes: APC, ATM, AXIN2, BARD1, BMPR1A, BRCA1, BRCA2, BRIP1, CDH1, CDKN2A (p14ARF), CDKN2A (p16INK4a), CHEK2, CTNNA1, DICER1, EPCAM (Deletion/duplication testing only), GREM1 (promoter region deletion/duplication te   Headache    migraines   Joint pain    Knee pain    Leg pain    Migraine    Mixed dyslipidemia 02/13/2017   Morbid obesity (HCC) 02/13/2017   Osteoarthritis    Sleep apnea    SOBOE (shortness of breath on exertion)    BP 111/75   Pulse 70   SpO2 98%   Opioid Risk Score:   Fall Risk Score:  `1  Depression screen [facility] 2/9     07/11/2022   10:56 AM 10/04/2021   11:00 AM 07/04/2021   11:27 AM 04/12/2021   12:01 PM 04/05/2021    8:43 AM 08/21/2018   11:49 AM 05/21/2018   11:35 AM  Depression screen PHQ 2/9  Decreased Interest 0 0 0 0 3 0 0  Down, Depressed, Hopeless 0 0 0 0 1 0 0  PHQ - 2 Score 0 0 0 0 4 0 0  Altered sleeping     3    Tired, decreased energy     3    Change in appetite     1    Feeling bad or  failure about yourself      1    Trouble concentrating     0    Moving slowly or fidgety/restless     0    Suicidal  thoughts     0    PHQ-9 Score     12    Difficult doing work/chores     Very difficult       Review of Systems  Musculoskeletal:        Bilateral knee pain Bilateral knee pain  All other systems reviewed and are negative.      Objective:   Physical Exam Vitals and nursing note reviewed.  Constitutional:      Appearance: Normal appearance.  Cardiovascular:     Rate and Rhythm: Normal rate and regular rhythm.     Pulses: Normal pulses.     Heart sounds: Normal heart sounds.  Pulmonary:     Effort: Pulmonary effort is normal.     Breath sounds: Normal breath sounds.  Musculoskeletal:     Cervical back: Normal range of motion and neck supple.     Comments: Normal Muscle Bulk and Muscle Testing Reveals:  Upper Extremities: Full: ROM and Muscle Strength 5/5 Bilateral Hips: Tenderness Noted  Lower Extremities: Decreased ROM and Muscle Strength 5/5 Arrived into her Scooter     Skin:    General: Skin is warm and dry.  Neurological:     Mental Status: She is alert and oriented to person, place, and time.  Psychiatric:        Mood and Affect: Mood normal.        Behavior: Behavior normal.         Assessment & Plan:  1. 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 Continue HEP as Tolerated. 10/12/2022 Refilled: Belbuca 450 MCG Film inside cheek every 12 hours #60.  We will continue the opioid monitoring program, this consists of regular clinic visits, examinations, urine drug screen, pill counts as well as use of [place] Controlled Substance Reporting system. A 12 month History has been reviewed on the [place] Controlled Substance Reporting System on 10/12/2022. 2. Bilateral Hip osteoarthritis: Continue HEP as Tolerated. Continue to Monitor. 10/12/2022  3. Bilateral Greater Trochanteric Tenderness: Continue to Alternate Ice and  Heat Therapy. Continue to Monitor. 10/12/2022 4. 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 Neurology Following. Continue to Monitor. 10/12/2022   F/U in 3 months

## 2022-10-17 ENCOUNTER — Other Ambulatory Visit: Payer: Self-pay

## 2022-10-17 ENCOUNTER — Other Ambulatory Visit (HOSPITAL_COMMUNITY): Payer: Self-pay

## 2022-10-18 ENCOUNTER — Other Ambulatory Visit: Payer: Self-pay

## 2022-10-20 ENCOUNTER — Other Ambulatory Visit: Payer: Self-pay

## 2022-10-21 ENCOUNTER — Other Ambulatory Visit (HOSPITAL_COMMUNITY): Payer: Self-pay

## 2022-10-23 ENCOUNTER — Other Ambulatory Visit: Payer: Self-pay

## 2022-10-24 ENCOUNTER — Other Ambulatory Visit (HOSPITAL_COMMUNITY): Payer: Self-pay

## 2022-10-25 ENCOUNTER — Other Ambulatory Visit: Payer: Self-pay

## 2022-10-27 ENCOUNTER — Other Ambulatory Visit (HOSPITAL_COMMUNITY): Payer: Self-pay

## 2022-11-02 ENCOUNTER — Other Ambulatory Visit (HOSPITAL_COMMUNITY): Payer: Self-pay

## 2022-11-02 ENCOUNTER — Other Ambulatory Visit: Payer: Self-pay

## 2022-11-02 MED ORDER — TROSPIUM CHLORIDE ER 60 MG PO CP24
60.0000 mg | ORAL_CAPSULE | Freq: Every day | ORAL | 3 refills | Status: DC
  Filled 2022-11-02: qty 30, 30d supply, fill #0
  Filled 2022-11-28 – 2022-11-30 (×3): qty 30, 30d supply, fill #1
  Filled 2022-12-25: qty 30, 30d supply, fill #2
  Filled 2023-01-22: qty 30, 30d supply, fill #3

## 2022-11-06 ENCOUNTER — Other Ambulatory Visit (HOSPITAL_COMMUNITY): Payer: Self-pay

## 2022-11-14 ENCOUNTER — Other Ambulatory Visit (HOSPITAL_COMMUNITY): Payer: Self-pay

## 2022-11-16 ENCOUNTER — Other Ambulatory Visit (HOSPITAL_COMMUNITY): Payer: Self-pay

## 2022-11-17 ENCOUNTER — Other Ambulatory Visit (HOSPITAL_COMMUNITY): Payer: Self-pay

## 2022-11-18 ENCOUNTER — Other Ambulatory Visit (HOSPITAL_COMMUNITY): Payer: Self-pay

## 2022-11-22 ENCOUNTER — Other Ambulatory Visit (HOSPITAL_COMMUNITY): Payer: Self-pay

## 2022-11-27 ENCOUNTER — Other Ambulatory Visit (HOSPITAL_COMMUNITY): Payer: Self-pay

## 2022-11-28 ENCOUNTER — Other Ambulatory Visit (HOSPITAL_COMMUNITY): Payer: Self-pay

## 2022-11-28 DIAGNOSIS — F418 Other specified anxiety disorders: Secondary | ICD-10-CM | POA: Diagnosis not present

## 2022-11-28 DIAGNOSIS — R632 Polyphagia: Secondary | ICD-10-CM | POA: Diagnosis not present

## 2022-11-28 DIAGNOSIS — Z6841 Body Mass Index (BMI) 40.0 and over, adult: Secondary | ICD-10-CM | POA: Diagnosis not present

## 2022-11-28 DIAGNOSIS — E785 Hyperlipidemia, unspecified: Secondary | ICD-10-CM | POA: Diagnosis not present

## 2022-11-28 DIAGNOSIS — I2510 Atherosclerotic heart disease of native coronary artery without angina pectoris: Secondary | ICD-10-CM | POA: Diagnosis not present

## 2022-11-29 ENCOUNTER — Other Ambulatory Visit (HOSPITAL_COMMUNITY): Payer: Self-pay

## 2022-11-29 MED ORDER — PHENTERMINE HCL 37.5 MG PO TABS
ORAL_TABLET | ORAL | 0 refills | Status: DC
  Filled 2022-11-29 – 2022-12-25 (×2): qty 45, 30d supply, fill #0

## 2022-11-29 MED ORDER — WEGOVY 0.25 MG/0.5ML ~~LOC~~ SOAJ
0.2500 mg | SUBCUTANEOUS | 0 refills | Status: DC
  Filled 2022-11-29: qty 2, 28d supply, fill #0

## 2022-11-30 ENCOUNTER — Other Ambulatory Visit: Payer: Self-pay

## 2022-11-30 ENCOUNTER — Encounter: Payer: Self-pay | Admitting: Pharmacist

## 2022-11-30 ENCOUNTER — Other Ambulatory Visit (HOSPITAL_COMMUNITY): Payer: Self-pay

## 2022-12-01 ENCOUNTER — Other Ambulatory Visit (HOSPITAL_COMMUNITY): Payer: Self-pay

## 2022-12-11 ENCOUNTER — Other Ambulatory Visit: Payer: Self-pay

## 2022-12-11 ENCOUNTER — Other Ambulatory Visit (HOSPITAL_COMMUNITY): Payer: Self-pay

## 2022-12-13 ENCOUNTER — Other Ambulatory Visit (HOSPITAL_COMMUNITY): Payer: Self-pay

## 2022-12-14 ENCOUNTER — Other Ambulatory Visit (HOSPITAL_COMMUNITY): Payer: Self-pay

## 2022-12-14 ENCOUNTER — Other Ambulatory Visit: Payer: Self-pay

## 2022-12-25 ENCOUNTER — Other Ambulatory Visit (HOSPITAL_COMMUNITY): Payer: Self-pay

## 2022-12-26 ENCOUNTER — Other Ambulatory Visit: Payer: Self-pay

## 2023-01-09 ENCOUNTER — Encounter: Payer: HMO | Attending: Registered Nurse | Admitting: Registered Nurse

## 2023-01-09 ENCOUNTER — Encounter: Payer: Self-pay | Admitting: Registered Nurse

## 2023-01-09 ENCOUNTER — Other Ambulatory Visit (HOSPITAL_COMMUNITY): Payer: Self-pay

## 2023-01-09 ENCOUNTER — Encounter (HOSPITAL_COMMUNITY): Payer: Self-pay

## 2023-01-09 VITALS — HR 73 | Ht <= 58 in | Wt 252.0 lb

## 2023-01-09 DIAGNOSIS — M170 Bilateral primary osteoarthritis of knee: Secondary | ICD-10-CM

## 2023-01-09 DIAGNOSIS — R632 Polyphagia: Secondary | ICD-10-CM | POA: Diagnosis not present

## 2023-01-09 DIAGNOSIS — M7062 Trochanteric bursitis, left hip: Secondary | ICD-10-CM | POA: Insufficient documentation

## 2023-01-09 DIAGNOSIS — G894 Chronic pain syndrome: Secondary | ICD-10-CM

## 2023-01-09 DIAGNOSIS — M7061 Trochanteric bursitis, right hip: Secondary | ICD-10-CM | POA: Diagnosis not present

## 2023-01-09 DIAGNOSIS — R600 Localized edema: Secondary | ICD-10-CM | POA: Diagnosis not present

## 2023-01-09 DIAGNOSIS — Z5181 Encounter for therapeutic drug level monitoring: Secondary | ICD-10-CM | POA: Diagnosis not present

## 2023-01-09 DIAGNOSIS — Z6841 Body Mass Index (BMI) 40.0 and over, adult: Secondary | ICD-10-CM | POA: Diagnosis not present

## 2023-01-09 DIAGNOSIS — I2510 Atherosclerotic heart disease of native coronary artery without angina pectoris: Secondary | ICD-10-CM | POA: Diagnosis not present

## 2023-01-09 DIAGNOSIS — Z79891 Long term (current) use of opiate analgesic: Secondary | ICD-10-CM | POA: Diagnosis not present

## 2023-01-09 DIAGNOSIS — I10 Essential (primary) hypertension: Secondary | ICD-10-CM | POA: Diagnosis not present

## 2023-01-09 DIAGNOSIS — E785 Hyperlipidemia, unspecified: Secondary | ICD-10-CM | POA: Diagnosis not present

## 2023-01-09 DIAGNOSIS — E66813 Obesity, class 3: Secondary | ICD-10-CM | POA: Diagnosis not present

## 2023-01-09 DIAGNOSIS — G4733 Obstructive sleep apnea (adult) (pediatric): Secondary | ICD-10-CM | POA: Diagnosis not present

## 2023-01-09 DIAGNOSIS — R7303 Prediabetes: Secondary | ICD-10-CM | POA: Diagnosis not present

## 2023-01-09 DIAGNOSIS — G4700 Insomnia, unspecified: Secondary | ICD-10-CM | POA: Diagnosis not present

## 2023-01-09 MED ORDER — PHENTERMINE HCL 37.5 MG PO TABS
ORAL_TABLET | ORAL | 0 refills | Status: DC
  Filled 2023-01-22: qty 45, 30d supply, fill #0

## 2023-01-09 MED ORDER — OXYCODONE HCL 10 MG PO TABS
10.0000 mg | ORAL_TABLET | Freq: Four times a day (QID) | ORAL | 0 refills | Status: DC | PRN
  Filled 2023-01-09: qty 120, 30d supply, fill #0

## 2023-01-09 NOTE — Progress Notes (Unsigned)
Subjective:    Patient ID: Anita Booker, female    DOB: 28-Nov-1960, 62 y.o.   MRN: [id number]  HPI: Anita Booker is a 62 y.o. female who returns for follow up appointment for chronic pain and medication refill. states *** pain is located in  ***. rates pain ***. current exercise regime is walking and performing stretching exercises.   [name] Booker equivalent is *** MME.   Oral Swab ordered today .    Pain Inventory Average Pain 9 Pain Right Now 7 My pain is sharp, burning, dull, stabbing, tingling, and aching  In the last 24 hours, has pain interfered with the following? General activity 10 Relation with others 10 Enjoyment of life 10 What TIME of day is your pain at its worst? evening and night Sleep (in general) Poor  Pain is worse with: walking, bending, sitting, standing, and some activites Pain improves with: rest, heat/ice, and medication Relief from Meds: 7  Family History  Problem Relation Age of Onset   Cancer Mother    Thyroid disease Mother    Stroke Mother    Hypertension Mother    Diabetes Mother    Uterine cancer Mother 32   Obesity Mother    Obesity Father    Hypertension Father    Lung cancer Father 40   Cancer Father        lung   Breast cancer Sister 47       identical twin   Healthy Sister    Healthy Brother    Heart attack Brother 48   Cancer Paternal Grandmother        breast   Lung cancer Paternal Uncle        all four uncles had lung cancer and were smokers   Breast cancer Cousin        paternal cousin dx in her 94s   Liver cancer Cousin        paternal cousin with hepatitis and liver cancer   Breast cancer Other    Social History   Socioeconomic History   Marital status: Married    Spouse name: Wonda Horner   Number of children: 5   Years of education: Not on file   Highest education level: Not on file  Occupational History   Occupation: RN  Tobacco Use   Smoking status: Never    Smokeless tobacco: Never  Vaping Use   Vaping status: Never Used  Substance and Sexual Activity   Alcohol use: No   Drug use: No   Sexual activity: Not on file  Other Topics Concern   Not on file  Social History Narrative   Right handed   Social Determinants of Health   Financial Resource Strain: Patient Declined (10/22/2022)   Received from [facility]   Overall Financial Resource Strain (CARDIA)    Difficulty of Paying Living Expenses: Patient declined  Food Insecurity: Patient Declined (10/22/2022)   Received from [facility]   Hunger Vital Sign    Worried About Running Out of Food in the Last Year: Patient declined    Ran Out of Food in the Last Year: Patient declined  Transportation Needs: No Transportation Needs (10/22/2022)   Received from [employer] - Transportation    Lack of Transportation (Medical): No    Lack of Transportation (Non-Medical): No  Physical Activity: Unknown (10/22/2022)   Received from [facility]   Exercise Vital Sign    Days of Exercise per Week:  Patient declined    Minutes of Exercise per Session: 10 min  Stress: Patient Declined (10/22/2022)   Received from [facility] - Occupational Stress Questionnaire    Feeling of Stress : Patient declined  Social Connections: Moderately Integrated (10/22/2022)   Received from [facility]   Social Network    How would you rate your social network (family, work, friends)?: Adequate participation with social networks   Past Surgical History:  Procedure Laterality Date   BREAST EXCISIONAL BIOPSY Right    infection to right breast 5 years ago.   BREAST SURGERY  07/01/2011.   I & D of right breast abscess   CHOLECYSTECTOMY  1995   COLONOSCOPY WITH PROPOFOL N/A 07/05/2016   Procedure: COLONOSCOPY WITH PROPOFOL;  Surgeon: Willis Modena, MD;  Location: [facility];  Service: Endoscopy;  Laterality: N/A;   LEFT HEART CATH AND CORONARY ANGIOGRAPHY N/A  03/26/2017   Procedure: LEFT HEART CATH AND CORONARY ANGIOGRAPHY;  Surgeon: Yvonne Kendall, MD;  [facility];  Service: Cardiovascular;  Laterality: N/A;   Past Surgical History:  Procedure Laterality Date   BREAST EXCISIONAL BIOPSY Right    infection to right breast 5 years ago.   BREAST SURGERY  07/01/2011.   I & D of right breast abscess   CHOLECYSTECTOMY  1995   COLONOSCOPY WITH PROPOFOL N/A 07/05/2016   Procedure: COLONOSCOPY WITH PROPOFOL;  Surgeon: Willis Modena, MD;  Location: [facility];  Service: Endoscopy;  Laterality: N/A;   LEFT HEART CATH AND CORONARY ANGIOGRAPHY N/A 03/26/2017   Procedure: LEFT HEART CATH AND CORONARY ANGIOGRAPHY;  Surgeon: Yvonne Kendall, MD;  [facility]: [facility];  Service: Cardiovascular;  Laterality: N/A;   Past Medical History:  Diagnosis Date   Abnormal nuclear cardiac imaging test 03/23/2017   Arthritis    Bilateral primary osteoarthritis of knee 06/05/2016   Cellulitis and abscess of trunk 04/30/2012   Chest pain 02/13/2017   Chronic pain of left knee 10/31/2011   Significant medial compartment DJD bilaterally. Both knees had steroid injection done on October 31, 2011   Chronic pain syndrome 09/28/2016   Complication of anesthesia 1995   woke up on table right after gallbladder surgery   Constipation    Corneal ulcer 08/06/2012   Edema, lower extremity    Essential hypertension 02/13/2017   Family history of adverse reaction to anesthesia    sister coded twice with anesthesai not sure of details sister still living   Family history of breast cancer    Family history of uterine cancer    Gallbladder problem    Genetic testing 09/07/2016   Negative genetic testing on the Common Hereditary cancer panel.  The Hereditary Gene Panel offered by [facility] includes sequencing and/or deletion duplication testing of the following 46 genes: APC, ATM, AXIN2, BARD1, BMPR1A, BRCA1, BRCA2, BRIP1, CDH1, CDKN2A (p14ARF), CDKN2A  (p16INK4a), CHEK2, CTNNA1, DICER1, EPCAM (Deletion/duplication testing only), GREM1 (promoter region deletion/duplication te   Headache    migraines   Joint pain    Knee pain    Leg pain    Migraine    Mixed dyslipidemia 02/13/2017   Morbid obesity (HCC) 02/13/2017   Osteoarthritis    Sleep apnea    SOBOE (shortness of breath on exertion)    Pulse 73   Ht 4\' 10"  (1.473 m)   Wt 252 lb (114.3 kg)   SpO2 94%   BMI 52.67 kg/m   Opioid Risk Score:   Fall  Risk Score:  `1  Depression screen [facility] 2/9     07/11/2022   10:56 AM 10/04/2021   11:00 AM 07/04/2021   11:27 AM 04/12/2021   12:01 PM 04/05/2021    8:43 AM 08/21/2018   11:49 AM 05/21/2018   11:35 AM  Depression screen [facility] 2/9  Decreased Interest 0 0 0 0 3 0 0  Down, Depressed, Hopeless 0 0 0 0 1 0 0  PHQ - 2 Score 0 0 0 0 4 0 0  Altered sleeping     3    Tired, decreased energy     3    Change in appetite     1    Feeling bad or failure about yourself      1    Trouble concentrating     0    Moving slowly or fidgety/restless     0    Suicidal thoughts     0    PHQ-9 Score     12    Difficult doing work/chores     Very difficult        Review of Systems  Musculoskeletal:        B/L knee and hip pain  All other systems reviewed and are negative.     Objective:   Physical Exam        Assessment & Plan:  1. 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 Continue HEP as Tolerated. 10/12/2022 Refilled: Belbuca 450 MCG Film inside cheek every 12 hours #60.  We will continue the opioid monitoring program, this consists of regular clinic visits, examinations, urine drug screen, pill counts as well as use of [place] Controlled Substance Reporting system. A 12 month History has been reviewed on the [place] Controlled Substance Reporting System on 10/12/2022. 2. Bilateral Hip osteoarthritis: Continue HEP as Tolerated. Continue to Monitor. 10/12/2022  3. Bilateral Greater Trochanteric  Tenderness: Continue to Alternate Ice and Heat Therapy. Continue to Monitor. 10/12/2022 4. 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 Neurology Following. Continue to Monitor. 10/12/2022   F/U in 3 months

## 2023-01-10 ENCOUNTER — Encounter: Payer: Self-pay | Admitting: Registered Nurse

## 2023-01-14 LAB — DRUG TOX MONITOR 1 W/CONF, ORAL FLD
Amphetamines: NEGATIVE ng/mL (ref <10)
Barbiturates: NEGATIVE ng/mL (ref <10)
Benzodiazepines: NEGATIVE ng/mL (ref <0.50)
Buprenorphine: >25 ng/mL — ABNORMAL HIGH (ref <0.10)
Buprenorphine: POSITIVE ng/mL — AB (ref <0.10)
Cocaine: NEGATIVE ng/mL (ref <5.0)
Fentanyl: NEGATIVE ng/mL (ref <0.10)
Heroin Metabolite: NEGATIVE ng/mL (ref <1.0)
MARIJUANA: NEGATIVE ng/mL (ref <2.5)
MDMA: NEGATIVE ng/mL (ref <10)
Meprobamate: NEGATIVE ng/mL (ref <2.5)
Methadone: NEGATIVE ng/mL (ref <5.0)
Naloxone: NEGATIVE ng/mL (ref <0.25)
Nicotine Metabolite: NEGATIVE ng/mL (ref <5.0)
Norbuprenorphine: 2.2 ng/mL — ABNORMAL HIGH (ref <0.50)
Opiates: NEGATIVE ng/mL (ref <2.5)
Phencyclidine: NEGATIVE ng/mL (ref <10)
Tapentadol: NEGATIVE ng/mL (ref <5.0)
Tramadol: NEGATIVE ng/mL (ref <5.0)
Zolpidem: NEGATIVE ng/mL (ref <5.0)

## 2023-01-14 LAB — DRUG TOX ALC METAB W/CON, ORAL FLD: Alcohol Metabolite: NEGATIVE ng/mL (ref <25)

## 2023-01-15 ENCOUNTER — Other Ambulatory Visit (HOSPITAL_COMMUNITY): Payer: Self-pay

## 2023-01-15 ENCOUNTER — Other Ambulatory Visit: Payer: Self-pay

## 2023-01-22 ENCOUNTER — Other Ambulatory Visit (HOSPITAL_COMMUNITY): Payer: Self-pay

## 2023-01-23 ENCOUNTER — Other Ambulatory Visit (HOSPITAL_COMMUNITY): Payer: Self-pay

## 2023-01-25 ENCOUNTER — Other Ambulatory Visit (HOSPITAL_COMMUNITY): Payer: Self-pay

## 2023-01-25 MED ORDER — TROSPIUM CHLORIDE ER 60 MG PO CP24
60.0000 mg | ORAL_CAPSULE | Freq: Every day | ORAL | 3 refills | Status: DC
  Filled 2023-02-20: qty 30, 30d supply, fill #0
  Filled 2023-03-26: qty 30, 30d supply, fill #1

## 2023-01-26 ENCOUNTER — Other Ambulatory Visit (HOSPITAL_COMMUNITY): Payer: Self-pay

## 2023-01-29 ENCOUNTER — Other Ambulatory Visit: Payer: Self-pay | Admitting: *Deleted

## 2023-01-29 DIAGNOSIS — I739 Peripheral vascular disease, unspecified: Secondary | ICD-10-CM

## 2023-02-02 ENCOUNTER — Ambulatory Visit (HOSPITAL_COMMUNITY)
Admission: RE | Admit: 2023-02-02 | Discharge: 2023-02-02 | Disposition: A | Discharge status: Home / Self Care | Payer: HMO | Source: Ambulatory Visit | Attending: Vascular Surgery | Admitting: Vascular Surgery

## 2023-02-02 ENCOUNTER — Other Ambulatory Visit (HOSPITAL_COMMUNITY): Payer: Self-pay

## 2023-02-02 DIAGNOSIS — I739 Peripheral vascular disease, unspecified: Secondary | ICD-10-CM

## 2023-02-06 ENCOUNTER — Other Ambulatory Visit (HOSPITAL_COMMUNITY): Payer: Self-pay

## 2023-02-06 ENCOUNTER — Encounter: Payer: HMO | Attending: Registered Nurse | Admitting: Registered Nurse

## 2023-02-06 VITALS — BP 137/85 | HR 74

## 2023-02-06 DIAGNOSIS — Z79891 Long term (current) use of opiate analgesic: Secondary | ICD-10-CM | POA: Insufficient documentation

## 2023-02-06 DIAGNOSIS — M7061 Trochanteric bursitis, right hip: Secondary | ICD-10-CM | POA: Insufficient documentation

## 2023-02-06 DIAGNOSIS — M170 Bilateral primary osteoarthritis of knee: Secondary | ICD-10-CM | POA: Insufficient documentation

## 2023-02-06 DIAGNOSIS — Z5181 Encounter for therapeutic drug level monitoring: Secondary | ICD-10-CM | POA: Insufficient documentation

## 2023-02-06 DIAGNOSIS — G894 Chronic pain syndrome: Secondary | ICD-10-CM | POA: Diagnosis not present

## 2023-02-06 DIAGNOSIS — M7062 Trochanteric bursitis, left hip: Secondary | ICD-10-CM | POA: Diagnosis not present

## 2023-02-06 MED ORDER — OXYCODONE HCL 10 MG PO TABS
10.0000 mg | ORAL_TABLET | Freq: Four times a day (QID) | ORAL | 0 refills | Status: DC | PRN
  Filled 2023-02-06 – 2023-02-20 (×2): qty 120, 30d supply, fill #0

## 2023-02-06 NOTE — Progress Notes (Unsigned)
Subjective:    Patient ID: Anita Booker, female    DOB: 11/09/1960, 62 y.o.   MRN: [id number]  HPI: Anita Booker is a 62 y.o. female who returns for follow up appointment for chronic pain and medication refill. She states her pain is located in her bilateral hops and bilateral knees. She rates her pain 7. Her current exercise regime is walking and performing stretching exercises.  Anita Booker Morphine equivalent is 60.00 MME.   Last Oral Swab was Performed on 01/09/2023, it was consistent.   Anita Booker husband has Alzheimer's, she is his care giver, we will allow her to come in two months, she verbalizes understanding.     Pain Inventory Average Pain 7 Pain Right Now 7 My pain is sharp, dull, stabbing, tingling, and aching  In the last 24 hours, has pain interfered with the following? General activity 5 Relation with others 5 Enjoyment of life 5 What TIME of day is your pain at its worst? evening and night Sleep (in general) Fair  Pain is worse with:  ambulate Pain improves with: medication Relief from Meds: 7  Family History  Problem Relation Age of Onset   Cancer Mother    Thyroid disease Mother    Stroke Mother    Hypertension Mother    Diabetes Mother    Uterine cancer Mother 43   Obesity Mother    Obesity Father    Hypertension Father    Lung cancer Father 30   Cancer Father        lung   Breast cancer Sister 48       identical twin   Healthy Sister    Healthy Brother    Heart attack Brother 48   Cancer Paternal Grandmother        breast   Lung cancer Paternal Uncle        all four uncles had lung cancer and were smokers   Breast cancer Cousin        paternal cousin dx in her 18s   Liver cancer Cousin        paternal cousin with hepatitis and liver cancer   Breast cancer Other    Social History   Socioeconomic History   Marital status: Married    Spouse name: Wonda Horner   Number of children: 5   Years of  education: Not on file   Highest education level: Not on file  Occupational History   Occupation: RN  Tobacco Use   Smoking status: Never   Smokeless tobacco: Never  Vaping Use   Vaping status: Never Used  Substance and Sexual Activity   Alcohol use: No   Drug use: No   Sexual activity: Not on file  Other Topics Concern   Not on file  Social History Narrative   Right handed   Social Determinants of Health   Financial Resource Strain: Patient Declined (10/22/2022)   Received from [facility]   Overall Financial Resource Strain (CARDIA)    Difficulty of Paying Living Expenses: Patient declined  Food Insecurity: Patient Declined (10/22/2022)   [facility] from [facility]   Hunger Vital Sign    Worried About Running Out of Food in the Last Year: Patient declined    Ran Out of Food in the Last Year: Patient declined  Transportation Needs: No Transportation Needs (10/22/2022)   Received from [facility] - Transportation    Lack of Transportation (Medical): No    Lack  of Transportation (Non-Medical): No  Physical Activity: Unknown (10/22/2022)   Received from [facility]   Exercise Vital Sign    Days of Exercise per Week: Patient declined    Minutes of Exercise per Session: 10 min  Stress: Patient Declined (10/22/2022)   Received from [facility] of Occupational Health - Occupational Stress Questionnaire    Feeling of Stress : Patient declined  Social Connections: Moderately Integrated (10/22/2022)   Received from [facility]   Social Network    How would you rate your social network (family, work, friends)?: Adequate participation with social networks   Past Surgical History:  Procedure Laterality Date   BREAST EXCISIONAL BIOPSY Right    infection to right breast 5 years ago.   BREAST SURGERY  07/01/2011.   I & D of right breast abscess   CHOLECYSTECTOMY  1995   COLONOSCOPY WITH PROPOFOL N/A 07/05/2016   Procedure: COLONOSCOPY WITH PROPOFOL;   Surgeon: Willis Modena, MD;  [facility]: [facility];  Service: Endoscopy;  Laterality: N/A;   LEFT HEART CATH AND CORONARY ANGIOGRAPHY N/A 03/26/2017   Procedure: LEFT HEART CATH AND CORONARY ANGIOGRAPHY;  Surgeon: Yvonne Kendall, MD;  [facility];  Service: Cardiovascular;  Laterality: N/A;   Past Surgical History:  Procedure Laterality Date   BREAST EXCISIONAL BIOPSY Right    infection to right breast 5 years ago.   BREAST SURGERY  07/01/2011.   I & D of right breast abscess   CHOLECYSTECTOMY  1995   COLONOSCOPY WITH PROPOFOL N/A 07/05/2016   Procedure: COLONOSCOPY WITH PROPOFOL;  Surgeon: Willis Modena, MD;  [facility]: [facility];  Service: Endoscopy;  Laterality: N/A;   LEFT HEART CATH AND CORONARY ANGIOGRAPHY N/A 03/26/2017   Procedure: LEFT HEART CATH AND CORONARY ANGIOGRAPHY;  Surgeon: Yvonne Kendall, MD;  [facility];  Service: Cardiovascular;  Laterality: N/A;   Past Medical History:  Diagnosis Date   Abnormal nuclear cardiac imaging test 03/23/2017   Arthritis    Bilateral primary osteoarthritis of knee 06/05/2016   Cellulitis and abscess of trunk 04/30/2012   Chest pain 02/13/2017   Chronic pain of left knee 10/31/2011   Significant medial compartment DJD bilaterally. Both knees had steroid injection done on October 31, 2011   Chronic pain syndrome 09/28/2016   Complication of anesthesia 1995   woke up on table right after gallbladder surgery   Constipation    Corneal ulcer 08/06/2012   Edema, lower extremity    Essential hypertension 02/13/2017   Family history of adverse reaction to anesthesia    sister coded twice with anesthesai not sure of details sister still living   Family history of breast cancer    Family history of uterine cancer    Gallbladder problem    Genetic testing 09/07/2016   Negative genetic testing on the Common Hereditary cancer panel.  The Hereditary Gene Panel offered by [facility] includes sequencing and/or  deletion duplication testing of the following 46 genes: APC, ATM, AXIN2, BARD1, BMPR1A, BRCA1, BRCA2, BRIP1, CDH1, CDKN2A (p14ARF), CDKN2A (p16INK4a), CHEK2, CTNNA1, DICER1, EPCAM (Deletion/duplication testing only), GREM1 (promoter region deletion/duplication te   Headache    migraines   Joint pain    Knee pain    Leg pain    Migraine    Mixed dyslipidemia 02/13/2017   Morbid obesity (HCC) 02/13/2017   Osteoarthritis    Sleep apnea    SOBOE (shortness of breath on exertion)    BP 137/85   Pulse  74   SpO2 93%   Opioid Risk Score:   Fall Risk Score:  `1  Depression screen [facility] 2/9     07/11/2022   10:56 AM 10/04/2021   11:00 AM 07/04/2021   11:27 AM 04/12/2021   12:01 PM 04/05/2021    8:43 AM 08/21/2018   11:49 AM 05/21/2018   11:35 AM  Depression screen PHQ 2/9  Decreased Interest 0 0 0 0 3 0 0  Down, Depressed, Hopeless 0 0 0 0 1 0 0  PHQ - 2 Score 0 0 0 0 4 0 0  Altered sleeping     3    Tired, decreased energy     3    Change in appetite     1    Feeling bad or failure about yourself      1    Trouble concentrating     0    Moving slowly or fidgety/restless     0    Suicidal thoughts     0    PHQ-9 Score     12    Difficult doing work/chores     Very difficult        Review of Systems  Genitourinary:  Positive for pelvic pain.  Musculoskeletal:        Bilateral knee pain  All other systems reviewed and are negative.     Objective:   Physical Exam Vitals and nursing note reviewed.  Constitutional:      Appearance: Normal appearance.  Cardiovascular:     Rate and Rhythm: Normal rate and regular rhythm.     Pulses: Normal pulses.     Heart sounds: Normal heart sounds.  Pulmonary:     Effort: Pulmonary effort is normal.     Breath sounds: Normal breath sounds.  Musculoskeletal:     Comments: Normal Muscle Bulk and Muscle Testing Reveals:  Upper Extremities: Full ROM and Muscle Strength 5/5  Bilateral Greater Trochanter Tenderness Lower Extremities: Decreased  ROM and Muscle Strength 4/5 Bilateral Lower Extremities Flexion Produces Pain into her Bilateral Patella's Arrived in scooter     Skin:    General: Skin is warm and dry.  Neurological:     Mental Status: She is alert and oriented to person, place, and time.  Psychiatric:        Mood and Affect: Mood normal.        Behavior: Behavior normal.         Assessment & Plan:  1. 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 Continue HEP as Tolerated. 02/06/2023 RX: Oxycodone 10 mg one tablet 4 times a day as needed for pain #120. Belbuca 450 MCG Film inside cheek every 12 hours #60. Discontinued due to Financial Hardship.  This was discussed with Dr [name] and he agrees with plan.  We will continue the opioid monitoring program, this consists of regular clinic visits, examinations, urine drug screen, pill counts as well as use of [place] Controlled Substance Reporting system. A 12 month History has been reviewed on the [place] Controlled Substance Reporting System on 02/06/2023. 2. Bilateral Hip osteoarthritis: Continue HEP as Tolerated. Continue to Monitor. 02/06/2023  3. Bilateral Greater Trochanteric Tenderness: Continue to Alternate Ice and Heat Therapy. Continue to Monitor. 02/06/2023 4. 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 Neurology Following. Continue to Monitor. 02/06/2023   F/U in 2 months

## 2023-02-07 NOTE — Progress Notes (Unsigned)
Office Note     CC:  PAD Requesting Provider:  Helane Rima, DO  HPI: Anita Booker is a 62 y.o. (April 08, 1960) female presenting at the request of .Marinda Elk, MD ***  The pt is *** on a statin for cholesterol management.  The pt is *** on a daily aspirin.   Other AC:  *** The pt is *** on medication for hypertension.   The pt is *** diabetic.  Tobacco hx:  ***  Past Medical History:  Diagnosis Date   Abnormal nuclear cardiac imaging test 03/23/2017   Arthritis    Bilateral primary osteoarthritis of knee 06/05/2016   Cellulitis and abscess of trunk 04/30/2012   Chest pain 02/13/2017   Chronic pain of left knee 10/31/2011   Significant medial compartment DJD bilaterally. Both knees had steroid injection done on October 31, 2011   Chronic pain syndrome 09/28/2016   Complication of anesthesia 1995   woke up on table right after gallbladder surgery   Constipation    Corneal ulcer 08/06/2012   Edema, lower extremity    Essential hypertension 02/13/2017   Family history of adverse reaction to anesthesia    sister coded twice with anesthesai not sure of details sister still living   Family history of breast cancer    Family history of uterine cancer    Gallbladder problem    Genetic testing 09/07/2016   Negative genetic testing on the Common Hereditary cancer panel.  The Hereditary Gene Panel offered by [facility] includes sequencing and/or deletion duplication testing of the following 46 genes: APC, ATM, AXIN2, BARD1, BMPR1A, BRCA1, BRCA2, BRIP1, CDH1, CDKN2A (p14ARF), CDKN2A (p16INK4a), CHEK2, CTNNA1, DICER1, EPCAM (Deletion/duplication testing only), GREM1 (promoter region deletion/duplication te   Headache    migraines   Joint pain    Knee pain    Leg pain    Migraine    Mixed dyslipidemia 02/13/2017   Morbid obesity (HCC) 02/13/2017   Osteoarthritis    Sleep apnea    SOBOE (shortness of breath on exertion)     Past Surgical History:  Procedure Laterality  Date   BREAST EXCISIONAL BIOPSY Right    infection to right breast 5 years ago.   BREAST SURGERY  07/01/2011.   I & D of right breast abscess   CHOLECYSTECTOMY  1995   COLONOSCOPY WITH PROPOFOL N/A 07/05/2016   Procedure: COLONOSCOPY WITH PROPOFOL;  Surgeon: Willis Modena, MD;  Location: [facility];  Service: Endoscopy;  Laterality: N/A;   LEFT HEART CATH AND CORONARY ANGIOGRAPHY N/A 03/26/2017   Procedure: LEFT HEART CATH AND CORONARY ANGIOGRAPHY;  Surgeon: Yvonne Kendall, MD;  [facility];  Service: Cardiovascular;  Laterality: N/A;    Social History   Socioeconomic History   Marital status: Married    Spouse name: Wonda Horner   Number of children: 5   Years of education: Not on file   Highest education level: Not on file  Occupational History   Occupation: RN  Tobacco Use   Smoking status: Never   Smokeless tobacco: Never  Vaping Use   Vaping status: Never Used  Substance and Sexual Activity   Alcohol use: No   Drug use: No   Sexual activity: Not on file  Other Topics Concern   Not on file  Social History Narrative   Right handed   Social Determinants of Health   Financial Resource Strain: Patient Declined (10/22/2022)   Received from [facility]   Overall Financial Resource Strain (CARDIA)  Difficulty of Paying Living Expenses: Patient declined  Food Insecurity: Patient Declined (10/22/2022)   Received from [facility]   Hunger Vital Sign    Worried About Running Out of Food in the Last Year: Patient declined    Ran Out of Food in the Last Year: Patient declined  Transportation Needs: No Transportation Needs (10/22/2022)   Received from [facility] - Transportation    Lack of Transportation (Medical): No    Lack of Transportation (Non-Medical): No  Physical Activity: Unknown (10/22/2022)   Received from [facility]   Exercise Vital Sign    Days of Exercise per Week: Patient declined    Minutes of Exercise per Session: 10  min  Stress: Patient Declined (10/22/2022)   Received from [facility] - Occupational Stress Questionnaire    Feeling of Stress : Patient declined  Social Connections: Moderately Integrated (10/22/2022)   Received from [facility]   Social Network    How would you rate your social network (family, work, friends)?: Adequate participation with social networks  Intimate Partner Violence: Not At Risk (10/22/2022)   Received from [facility]   HITS    Over the last 12 months how often did your partner physically hurt you?: Never    Over the last 12 months how often did your partner insult you or talk down to you?: Never    Over the last 12 months how often did your partner threaten you with physical harm?: Never    Over the last 12 months how often did your partner scream or curse at you?: Never   *** Family History  Problem Relation Age of Onset   Cancer Mother    Thyroid disease Mother    Stroke Mother    Hypertension Mother    Diabetes Mother    Uterine cancer Mother 85   Obesity Mother    Obesity Father    Hypertension Father    Lung cancer Father 28   Cancer Father        lung   Breast cancer Sister 78       identical twin   Healthy Sister    Healthy Brother    Heart attack Brother 48   Cancer Paternal Grandmother        breast   Lung cancer Paternal Uncle        all four uncles had lung cancer and were smokers   Breast cancer Cousin        paternal cousin dx in her 40s   Liver cancer Cousin        paternal cousin with hepatitis and liver cancer   Breast cancer Other     Current Outpatient Medications  Medication Sig Dispense Refill   albuterol (VENTOLIN HFA) 108 (90 Base) MCG/ACT inhaler Inhale 1 puff into the lungs every 4 (four) hours as needed. 6.7 g 1   aspirin EC 81 MG tablet Take 81 mg by mouth daily.      atorvastatin (LIPITOR) 40 MG tablet Take 40 mg by mouth daily.     atorvastatin (LIPITOR) 40 MG tablet Take 1  tablet (40 mg total) by mouth daily. 90 tablet 1   atorvastatin (LIPITOR) 40 MG tablet Take 1 tablet (40 mg total) by mouth daily. 90 tablet 3   Calcium Carbonate (CALCIUM 600 PO) Take 1 tablet by mouth daily.     celecoxib (CELEBREX) 200 MG capsule Take 200 mg by mouth daily as  needed for pain.     celecoxib (CELEBREX) 200 MG capsule Take 1 capsule (200 mg total) by mouth daily. 90 capsule 1   celecoxib (CELEBREX) 200 MG capsule Take 1 capsule (200 mg total) by mouth daily with food 90 capsule 3   cholecalciferol (VITAMIN D) 1000 units tablet Take 1,000 Units by mouth daily.     clobetasol (TEMOVATE) 0.05 % external solution Apply to affected area daily at bedtimes for 2-3 weeks, then 1 week off. Repeat as needed. 50 mL 2   furosemide (LASIX) 20 MG tablet Take 20 mg by mouth daily.     furosemide (LASIX) 20 MG tablet Take 1 tablet (20 mg total) by mouth daily as needed. 90 tablet 1   hydrOXYzine (ATARAX) 25 MG tablet Take 1 tablet (25 mg total) by mouth every 8 (eight) hours as needed. 90 tablet 3   mirabegron ER (MYRBETRIQ) 50 MG TB24 tablet Take 50 mg by mouth daily.     mirabegron ER (MYRBETRIQ) 50 MG TB24 tablet Take 1 tablet (50 mg) by mouth daily. 90 tablet 3   Multiple Vitamins-Minerals (MULTIVITAMIN WITH MINERALS) tablet Take 1 tablet by mouth daily.     nitroGLYCERIN (NITROSTAT) 0.4 MG SL tablet Place 0.4 mg under the tongue every 5 (five) minutes as needed for chest pain.     nortriptyline (PAMELOR) 50 MG capsule Take 100 mg by mouth at bedtime. Reported on 06/04/2015     nortriptyline (PAMELOR) 50 MG capsule Take 1 capsule (50 mg total) by mouth at bedtime. 180 capsule 1   nortriptyline (PAMELOR) 50 MG capsule Take 2 capsules (100 mg total) by mouth at bedtime. 180 capsule 3   Oxycodone HCl 10 MG TABS Take 1 tablet (10 mg total) by mouth 4 (four) times daily as needed. 120 tablet 0   phentermine (ADIPEX-P) 37.5 MG tablet Take 1 tablet (37.5 mg total) by mouth daily before breakfast. 90  tablet 0   phentermine (ADIPEX-P) 37.5 MG tablet Take 1 tablet (37.5 mg total) by mouth in the morning AND 0.5 tablets (18.75 mg total) daily at noon. 45 tablet 0   Semaglutide-Weight Management (WEGOVY) 0.5 MG/0.5ML SOAJ Inject 0.5 mg into the skin once a week. 2 mL 0   Semaglutide-Weight Management (WEGOVY) 1 MG/0.5ML SOAJ Inject 1 mg into the skin once a week. 6 mL 0   spironolactone (ALDACTONE) 100 MG tablet Take 1 tablet (100 mg total) by mouth daily. 90 tablet 3   topiramate (TOPAMAX) 25 MG tablet Take 1 tablet (25 mg total) by mouth daily. 30 tablet 0   Trospium Chloride 60 MG CP24 Take 1 capsule (60 mg total) by mouth daily. 90 capsule 3   Trospium Chloride 60 MG CP24 Take 1 capsule (60 mg total) by mouth daily. 30 capsule 3   valsartan (DIOVAN) 320 MG tablet Take 320 mg by mouth daily.  0   valsartan (DIOVAN) 320 MG tablet Take 1 tablet (320 mg total) by mouth daily. 90 tablet 3   zolpidem (AMBIEN) 10 MG tablet Take 1 tablet (10 mg total) by mouth at bedtime as needed. 30 tablet 0   zolpidem (AMBIEN) 10 MG tablet Take 1 tablet (10 mg total) by mouth at bedtime as needed. 30 tablet 3   zolpidem (AMBIEN) 10 MG tablet Take 1 tablet (10 mg total) by mouth at bedtime as needed. 30 tablet 0   zolpidem (AMBIEN) 5 MG tablet Take 5 mg by mouth at bedtime as needed for sleep.  No current facility-administered medications for this visit.    Allergies  Allergen Reactions   Butrans [Buprenorphine] Rash    Patch causes severe skin rash   Amoxicillin-Pot Clavulanate Diarrhea and Other (See Comments)   Codeine Nausea Only     REVIEW OF SYSTEMS:  *** [X]  denotes positive finding, [ ]  denotes negative finding Cardiac  Comments:  Chest pain or chest pressure:    Shortness of breath upon exertion:    Short of breath when lying flat:    Irregular heart rhythm:        Vascular    Pain in calf, thigh, or hip brought on by ambulation:    Pain in feet at night that wakes you up from your  sleep:     Blood clot in your veins:    Leg swelling:         Pulmonary    Oxygen at home:    Productive cough:     Wheezing:         Neurologic    Sudden weakness in arms or legs:     Sudden numbness in arms or legs:     Sudden onset of difficulty speaking or slurred speech:    Temporary loss of vision in one eye:     Problems with dizziness:         Gastrointestinal    Blood in stool:     Vomited blood:         Genitourinary    Burning when urinating:     Blood in urine:        Psychiatric    Major depression:         Hematologic    Bleeding problems:    Problems with blood clotting too easily:        Skin    Rashes or ulcers:        Constitutional    Fever or chills:      PHYSICAL EXAMINATION:  There were no vitals filed for this visit.  General:  WDWN in NAD; vital signs documented above Gait: Not observed HENT: WNL, normocephalic Pulmonary: normal non-labored breathing , without wheezing Cardiac: {Desc; regular/irreg:14544} HR Abdomen: soft, NT, no masses Skin: {With/Without:20273} rashes Vascular Exam/Pulses:  Right Left  Radial {Exam; arterial pulse strength 0-4:30167} {Exam; arterial pulse strength 0-4:30167}  Ulnar {Exam; arterial pulse strength 0-4:30167} {Exam; arterial pulse strength 0-4:30167}  Femoral {Exam; arterial pulse strength 0-4:30167} {Exam; arterial pulse strength 0-4:30167}  Popliteal {Exam; arterial pulse strength 0-4:30167} {Exam; arterial pulse strength 0-4:30167}  DP {Exam; arterial pulse strength 0-4:30167} {Exam; arterial pulse strength 0-4:30167}  PT {Exam; arterial pulse strength 0-4:30167} {Exam; arterial pulse strength 0-4:30167}   Extremities: {With/Without:20273} ischemic changes, {With/Without:20273} Gangrene , {With/Without:20273} cellulitis; {With/Without:20273} open wounds;  Musculoskeletal: no muscle wasting or atrophy  Neurologic: A&O X 3;  No focal weakness or paresthesias are detected Psychiatric:  The pt has  {Desc; normal/abnormal:11317::"Normal"} affect.   Non-Invasive Vascular Imaging:   ABI Findings:  +---------+------------------+-----+---------+--------+  Right   Rt Pressure (mmHg)IndexWaveform Comment   +---------+------------------+-----+---------+--------+  Brachial 115                                       +---------+------------------+-----+---------+--------+  PTA     144               1.22 triphasic          +---------+------------------+-----+---------+--------+  DP      136               1.15 triphasic          +---------+------------------+-----+---------+--------+  Great Toe108               0.92                    +---------+------------------+-----+---------+--------+   +---------+------------------+-----+---------+-------+  Left    Lt Pressure (mmHg)IndexWaveform Comment  +---------+------------------+-----+---------+-------+  Brachial 118                                      +---------+------------------+-----+---------+-------+  PTA     123               1.04 triphasic         +---------+------------------+-----+---------+-------+  DP      133               1.13 triphasic         +---------+------------------+-----+---------+-------+  Randie Heinz Toe83                0.70                   +---------+------------------+-----+---------+-------+     ASSESSMENT/PLAN: Sharunda Ransaw is a 62 y.o. female presenting with ***   ***   Victorino Sparrow, MD [facility] [phone]

## 2023-02-08 ENCOUNTER — Encounter: Payer: Self-pay | Admitting: Vascular Surgery

## 2023-02-08 ENCOUNTER — Encounter: Payer: Self-pay | Admitting: Registered Nurse

## 2023-02-08 ENCOUNTER — Ambulatory Visit: Payer: HMO | Admitting: Vascular Surgery

## 2023-02-08 VITALS — BP 107/73 | HR 74 | Temp 97.9°F | Resp 20 | Ht <= 58 in | Wt 252.0 lb

## 2023-02-08 DIAGNOSIS — R6889 Other general symptoms and signs: Secondary | ICD-10-CM | POA: Diagnosis not present

## 2023-02-09 ENCOUNTER — Other Ambulatory Visit (HOSPITAL_COMMUNITY): Payer: Self-pay

## 2023-02-14 ENCOUNTER — Other Ambulatory Visit: Payer: Self-pay

## 2023-02-14 ENCOUNTER — Other Ambulatory Visit (HOSPITAL_COMMUNITY): Payer: Self-pay

## 2023-02-19 ENCOUNTER — Other Ambulatory Visit (HOSPITAL_COMMUNITY): Payer: Self-pay

## 2023-02-19 MED ORDER — ZOLPIDEM TARTRATE 10 MG PO TABS
10.0000 mg | ORAL_TABLET | Freq: Every evening | ORAL | 3 refills | Status: DC | PRN
  Filled 2023-02-19: qty 30, 30d supply, fill #0
  Filled 2023-03-26: qty 30, 30d supply, fill #1
  Filled 2023-04-16: qty 30, 30d supply, fill #2

## 2023-02-20 ENCOUNTER — Other Ambulatory Visit (HOSPITAL_COMMUNITY): Payer: Self-pay

## 2023-02-21 ENCOUNTER — Other Ambulatory Visit (HOSPITAL_COMMUNITY): Payer: Self-pay

## 2023-02-21 ENCOUNTER — Other Ambulatory Visit (HOSPITAL_BASED_OUTPATIENT_CLINIC_OR_DEPARTMENT_OTHER): Payer: Self-pay

## 2023-02-21 ENCOUNTER — Other Ambulatory Visit: Payer: Self-pay

## 2023-02-21 DIAGNOSIS — Z6841 Body Mass Index (BMI) 40.0 and over, adult: Secondary | ICD-10-CM | POA: Diagnosis not present

## 2023-02-21 DIAGNOSIS — R7303 Prediabetes: Secondary | ICD-10-CM | POA: Diagnosis not present

## 2023-02-21 DIAGNOSIS — E66813 Obesity, class 3: Secondary | ICD-10-CM | POA: Diagnosis not present

## 2023-02-21 DIAGNOSIS — R632 Polyphagia: Secondary | ICD-10-CM | POA: Diagnosis not present

## 2023-02-21 DIAGNOSIS — I10 Essential (primary) hypertension: Secondary | ICD-10-CM | POA: Diagnosis not present

## 2023-02-21 MED ORDER — PHENTERMINE HCL 37.5 MG PO TABS
ORAL_TABLET | ORAL | 0 refills | Status: DC
  Filled 2023-02-21 (×2): qty 45, 30d supply, fill #0

## 2023-02-28 IMAGING — MG MM DIGITAL SCREENING BILAT W/ TOMO AND CAD
8 of 14 series · 8 of 40 positions shown · non-contrast
Comparison: Previous exam(s).

CLINICAL DATA: Screening.

EXAM:
DIGITAL SCREENING BILATERAL MAMMOGRAM WITH TOMOSYNTHESIS AND CAD
TECHNIQUE: Bilateral screening digital craniocaudal and mediolateral oblique
mammograms were obtained. Bilateral screening digital breast
tomosynthesis was performed. The images were evaluated with
computer-aided detection.

[L CC synth-2D (1 of 2)]
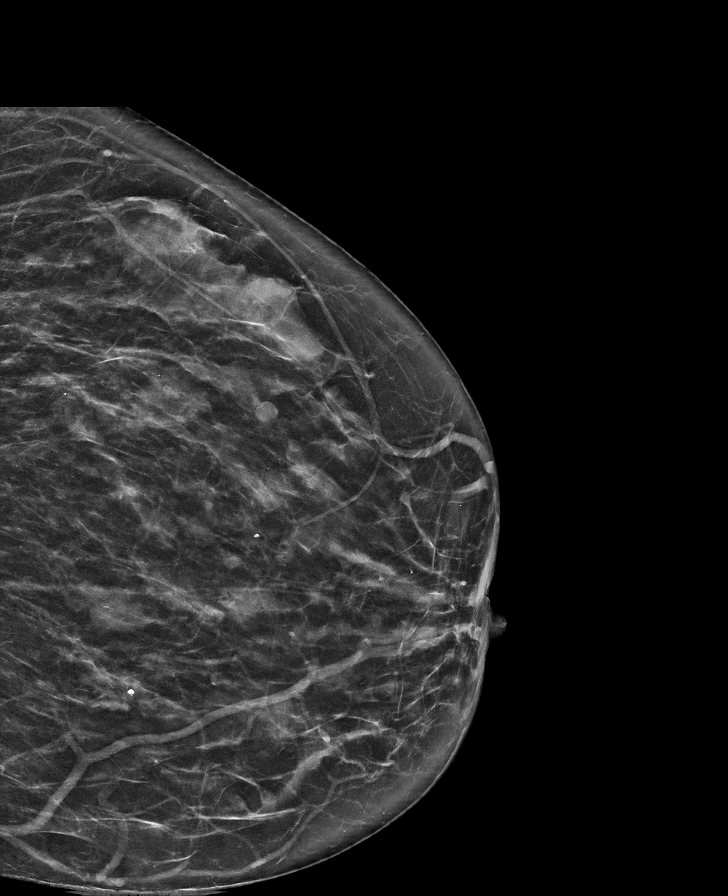

[R MLO synth-2D (1 of 2)]
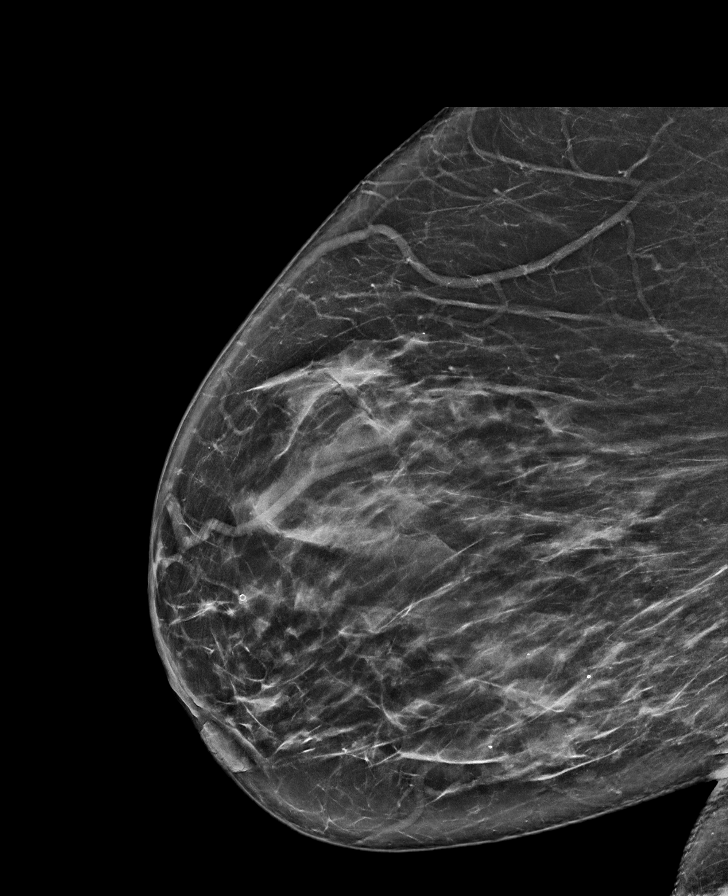

[R CC synth-2D (1 of 2)]
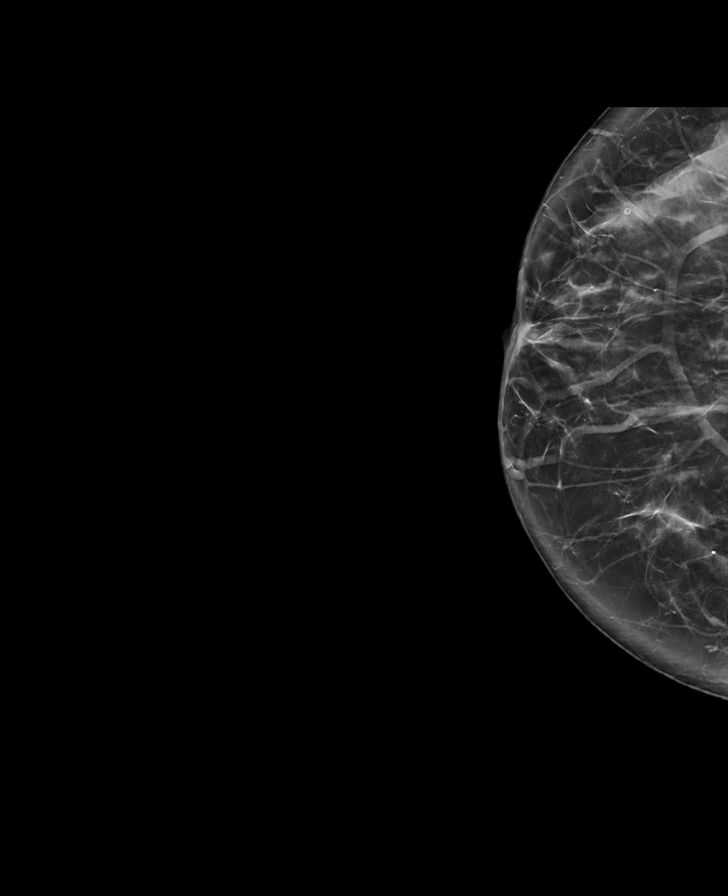

[L CC synth-2D (2 of 2)]
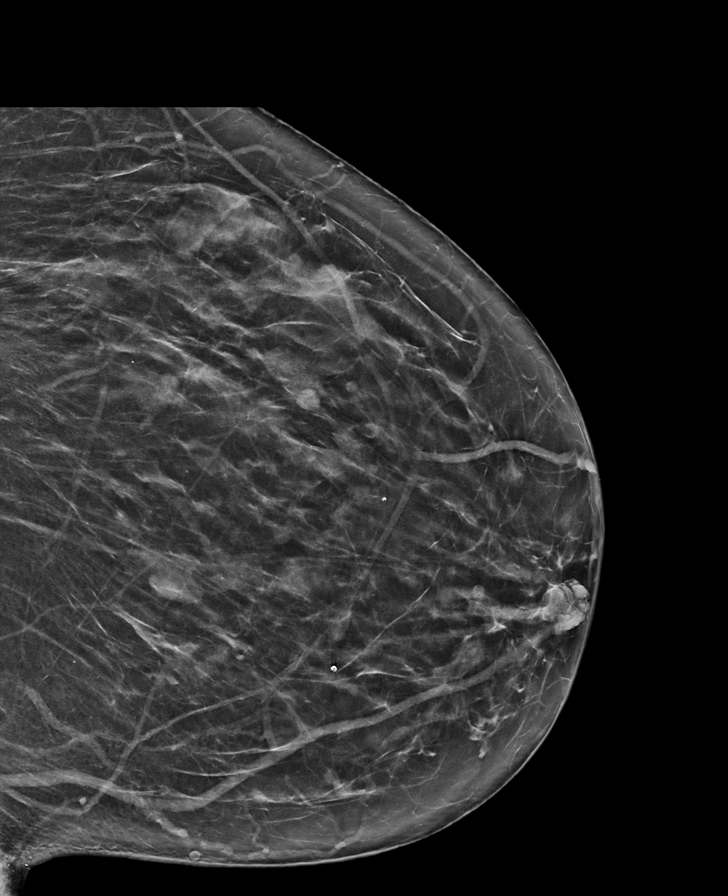

[R MLO synth-2D (2 of 2)]
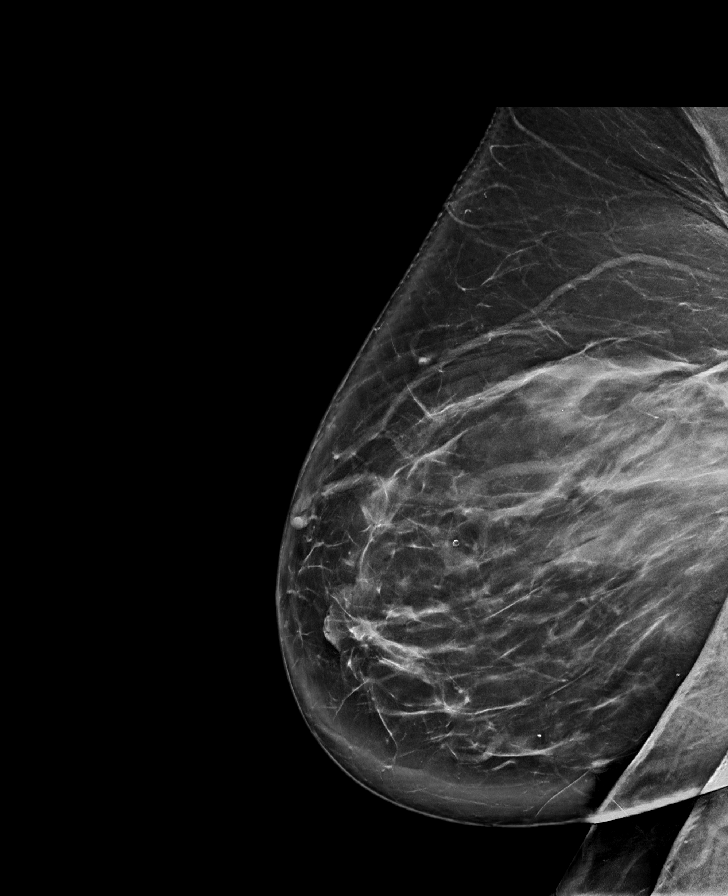

[R CC synth-2D (2 of 2)]
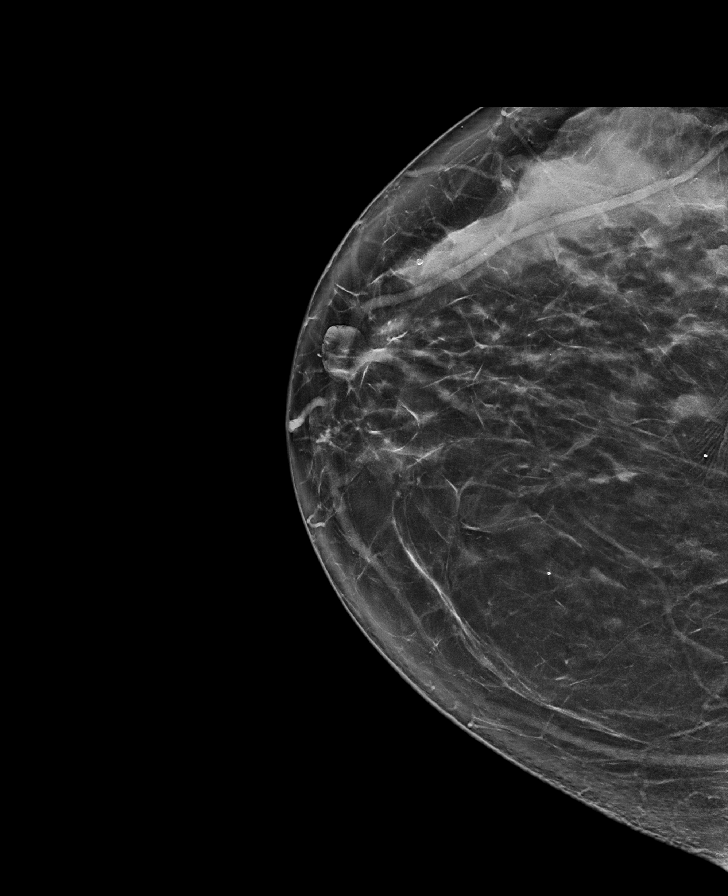

[L MLO synth-2D]
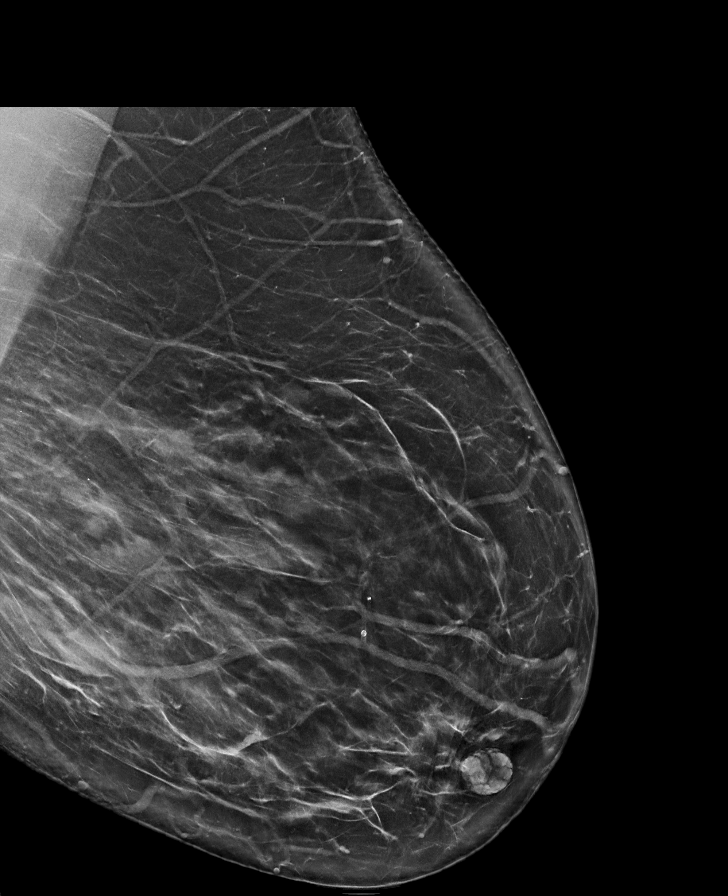

[R MLO tomo · tomo slice 51/102.0]
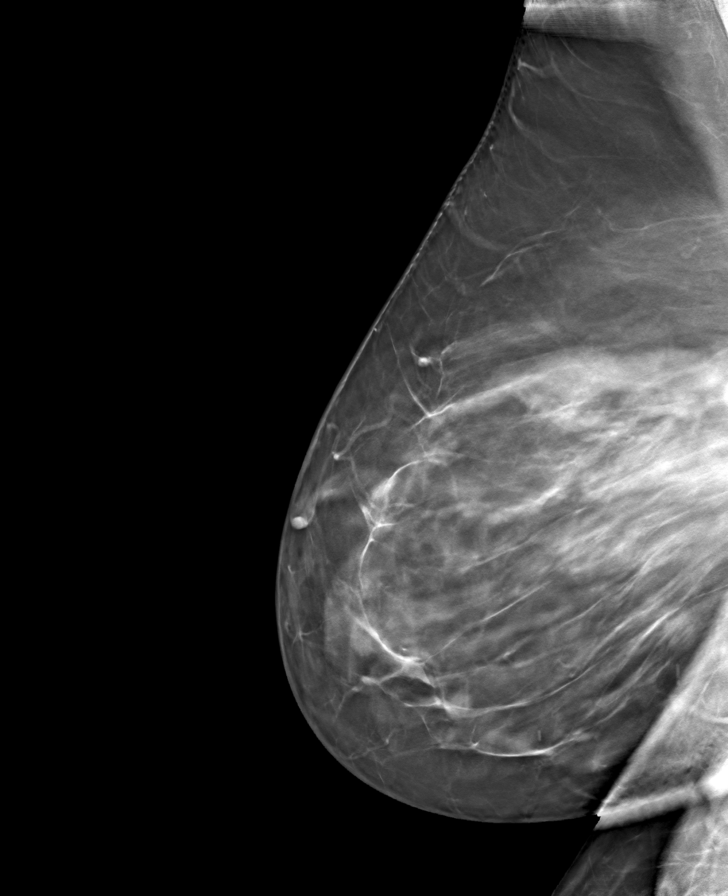

[8 of 40 positions shown; findings below may reference images not displayed]

ACR Breast Density Category c: The breast tissue is heterogeneously
dense, which may obscure small masses.
FINDINGS: There are no findings suspicious for malignancy.
IMPRESSION: No mammographic evidence of malignancy. A result letter of this
screening mammogram will be mailed directly to the patient.

RECOMMENDATION:
Screening mammogram in one year. (Code:Q3-W-BC3)

BI-RADS CATEGORY  1: Negative.

## 2023-03-02 ENCOUNTER — Other Ambulatory Visit (HOSPITAL_COMMUNITY): Payer: Self-pay

## 2023-03-07 ENCOUNTER — Other Ambulatory Visit (HOSPITAL_COMMUNITY): Payer: Self-pay

## 2023-03-07 ENCOUNTER — Other Ambulatory Visit: Payer: Self-pay

## 2023-03-07 ENCOUNTER — Telehealth: Payer: Self-pay | Admitting: Registered Nurse

## 2023-03-07 MED ORDER — OXYCODONE HCL 10 MG PO TABS
10.0000 mg | ORAL_TABLET | Freq: Four times a day (QID) | ORAL | 0 refills | Status: DC | PRN
  Filled 2023-03-07 – 2023-03-26 (×2): qty 120, 30d supply, fill #0

## 2023-03-07 NOTE — Telephone Encounter (Signed)
PMP was Reviewed.  Oxycodone e-scribed to pharmacy. [name] is aware via My- Chart message.

## 2023-03-27 ENCOUNTER — Other Ambulatory Visit (HOSPITAL_COMMUNITY): Payer: Self-pay

## 2023-03-27 ENCOUNTER — Other Ambulatory Visit: Payer: Self-pay

## 2023-04-03 ENCOUNTER — Other Ambulatory Visit (HOSPITAL_BASED_OUTPATIENT_CLINIC_OR_DEPARTMENT_OTHER): Payer: Self-pay

## 2023-04-04 ENCOUNTER — Other Ambulatory Visit (HOSPITAL_COMMUNITY): Payer: Self-pay

## 2023-04-04 MED ORDER — ZEPBOUND 5 MG/0.5ML ~~LOC~~ SOAJ
5.0000 mg | SUBCUTANEOUS | 0 refills | Status: DC
  Filled 2023-04-04 – 2023-04-23 (×2): qty 2, 28d supply, fill #0

## 2023-04-05 ENCOUNTER — Other Ambulatory Visit (HOSPITAL_COMMUNITY): Payer: Self-pay

## 2023-04-10 ENCOUNTER — Encounter: Payer: HMO | Admitting: Registered Nurse

## 2023-04-16 ENCOUNTER — Other Ambulatory Visit: Payer: Self-pay

## 2023-04-16 ENCOUNTER — Other Ambulatory Visit (HOSPITAL_COMMUNITY): Payer: Self-pay

## 2023-04-16 NOTE — Progress Notes (Unsigned)
Subjective:    Patient ID: Anita Booker, female    DOB: September 21, 1960, 63 y.o.   MRN: [id number]  HPI: Anita Booker is a 63 y.o. female who returns for follow up appointment for chronic pain and medication refill. states *** pain is located in  ***. rates pain ***. current exercise regime is walking and performing stretching exercises.  Anita Booker Morphine equivalent is *** MME.   Oral Swab was Performed today.      Pain Inventory Average Pain 7 Pain Right Now 8 My pain is intermittent, constant, sharp, burning, dull, stabbing, tingling, and aching  In the last 24 hours, has pain interfered with the following? General activity 6 Relation with others 5 Enjoyment of life 5 What TIME of day is your pain at its worst? evening and night Sleep (in general) Poor  Pain is worse with: walking and standing Pain improves with: rest, heat/ice, therapy/exercise, and medication Relief from Meds: 5  Family History  Problem Relation Age of Onset   Cancer Mother    Thyroid disease Mother    Stroke Mother    Hypertension Mother    Diabetes Mother    Uterine cancer Mother 21   Obesity Mother    Obesity Father    Hypertension Father    Lung cancer Father 5   Cancer Father        lung   Breast cancer Sister 68       identical twin   Healthy Sister    Healthy Brother    Heart attack Brother 48   Cancer Paternal Grandmother        breast   Lung cancer Paternal Uncle        all four uncles had lung cancer and were smokers   Breast cancer Cousin        paternal cousin dx in her 8s   Liver cancer Cousin        paternal cousin with hepatitis and liver cancer   Breast cancer Other    Social History   Socioeconomic History   Marital status: Married    Spouse name: Wonda Horner   Number of children: 5   Years of education: Not on file   Highest education level: Not on file  Occupational History   Occupation: RN  Tobacco Use   Smoking status: Never    Smokeless tobacco: Never  Vaping Use   Vaping status: Never Used  Substance and Sexual Activity   Alcohol use: No   Drug use: No   Sexual activity: Not on file  Other Topics Concern   Not on file  Social History Narrative   Right handed   Social Drivers of Health   Financial Resource Strain: Patient Declined (10/22/2022)   Received from [facility]   Overall Financial Resource Strain (CARDIA)    Difficulty of Paying Living Expenses: Patient declined  Food Insecurity: Patient Declined (10/22/2022)   Received [facility]   Hunger Vital Sign    Worried About Running Out of Food in the Last Year: Patient declined    Ran Out of Food in the Last Year: Patient declined  Transportation Needs: No Transportation Needs (10/22/2022)   Received from [facility] - Transportation    Lack of Transportation (Medical): No    Lack of Transportation (Non-Medical): No  Physical Activity: Unknown (10/22/2022)   Received from [facility]   Exercise Vital Sign    Days of Exercise per Week:  Patient declined    Minutes of Exercise per Session: 10 min  Stress: Patient Declined (10/22/2022)   Received from [facility] - Occupational Stress Questionnaire    Feeling of Stress : Patient declined  Social Connections: Moderately Integrated (10/22/2022)   Received from [facility]   Social Network    How would you rate your social network (family, work, friends)?: Adequate participation with social networks   Past Surgical History:  Procedure Laterality Date   BREAST EXCISIONAL BIOPSY Right    infection to right breast 5 years ago.   BREAST SURGERY  07/01/2011.   I & D of right breast abscess   CHOLECYSTECTOMY  1995   COLONOSCOPY WITH PROPOFOL N/A 07/05/2016   Procedure: COLONOSCOPY WITH PROPOFOL;  Surgeon: Willis Modena, MD;  Location: [facility];  Service: Endoscopy;  Laterality: N/A;   LEFT HEART CATH AND CORONARY ANGIOGRAPHY N/A  03/26/2017   Procedure: LEFT HEART CATH AND CORONARY ANGIOGRAPHY;  Surgeon: Yvonne Kendall, MD;  Location[facility];  Service: Cardiovascular;  Laterality: N/A;   Past Surgical History:  Procedure Laterality Date   BREAST EXCISIONAL BIOPSY Right    infection to right breast 5 years ago.   BREAST SURGERY  07/01/2011.   I & D of right breast abscess   CHOLECYSTECTOMY  1995   COLONOSCOPY WITH PROPOFOL N/A 07/05/2016   Procedure: COLONOSCOPY WITH PROPOFOL;  Surgeon: Willis Modena, MD;  Location: [facility];  Service: Endoscopy;  Laterality: N/A;   LEFT HEART CATH AND CORONARY ANGIOGRAPHY N/A 03/26/2017   Procedure: LEFT HEART CATH AND CORONARY ANGIOGRAPHY;  Surgeon: Yvonne Kendall, MD;  [facility]: [facility];  Service: Cardiovascular;  Laterality: N/A;   Past Medical History:  Diagnosis Date   Abnormal nuclear cardiac imaging test 03/23/2017   Arthritis    Bilateral primary osteoarthritis of knee 06/05/2016   Cellulitis and abscess of trunk 04/30/2012   Chest pain 02/13/2017   Chronic pain of left knee 10/31/2011   Significant medial compartment DJD bilaterally. Both knees had steroid injection done on October 31, 2011   Chronic pain syndrome 09/28/2016   Complication of anesthesia 1995   woke up on table right after gallbladder surgery   Constipation    Corneal ulcer 08/06/2012   Edema, lower extremity    Essential hypertension 02/13/2017   Family history of adverse reaction to anesthesia    sister coded twice with anesthesai not sure of details sister still living   Family history of breast cancer    Family history of uterine cancer    Gallbladder problem    Genetic testing 09/07/2016   Negative genetic testing on the Common Hereditary cancer panel.  The Hereditary Gene Panel offered by [facility] includes sequencing and/or deletion duplication testing of the following 46 genes: APC, ATM, AXIN2, BARD1, BMPR1A, BRCA1, BRCA2, BRIP1, CDH1, CDKN2A (p14ARF), CDKN2A  (p16INK4a), CHEK2, CTNNA1, DICER1, EPCAM (Deletion/duplication testing only), GREM1 (promoter region deletion/duplication te   Headache    migraines   Joint pain    Knee pain    Leg pain    Migraine    Mixed dyslipidemia 02/13/2017   Morbid obesity (HCC) 02/13/2017   Osteoarthritis    Sleep apnea    SOBOE (shortness of breath on exertion)    There were no vitals taken for this visit.  Opioid Risk Score:   Fall Risk Score:  `1  Depression screen [facility] 2/9     07/11/2022   10:56 AM  10/04/2021   11:00 AM 07/04/2021   11:27 AM 04/12/2021   12:01 PM 04/05/2021    8:43 AM 08/21/2018   11:49 AM 05/21/2018   11:35 AM  Depression screen PHQ 2/9  Decreased Interest 0 0 0 0 3 0 0  Down, Depressed, Hopeless 0 0 0 0 1 0 0  PHQ - 2 Score 0 0 0 0 4 0 0  Altered sleeping     3    Tired, decreased energy     3    Change in appetite     1    Feeling bad or failure about yourself      1    Trouble concentrating     0    Moving slowly or fidgety/restless     0    Suicidal thoughts     0    PHQ-9 Score     12    Difficult doing work/chores     Very difficult      Review of Systems  Musculoskeletal:  Positive for back pain and gait problem.       Pain in both knees  & both hips  All other systems reviewed and are negative.      Objective:   Physical Exam        Assessment & Plan:  1. 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 Continue HEP as Tolerated. 02/06/2023 RX: Oxycodone 10 mg one tablet 4 times a day as needed for pain #120. Belbuca 450 MCG Film inside cheek every 12 hours #60. Discontinued due to Financial Hardship.  This was discussed with Dr [name] and he agrees with plan.  We will continue the opioid monitoring program, this consists of regular clinic visits, examinations, urine drug screen, pill counts as well as use of [place] Controlled Substance Reporting system. A 12 month History has been reviewed on the [place] Controlled Substance  Reporting System on 02/06/2023. 2. Bilateral Hip osteoarthritis: Continue HEP as Tolerated. Continue to Monitor. 02/06/2023  3. Bilateral Greater Trochanteric Tenderness: Continue to Alternate Ice and Heat Therapy. Continue to Monitor. 02/06/2023 4. 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 Neurology Following. Continue to Monitor. 02/06/2023   F/U in 2 months

## 2023-04-17 ENCOUNTER — Encounter: Payer: HMO | Attending: Registered Nurse | Admitting: Registered Nurse

## 2023-04-17 ENCOUNTER — Encounter: Payer: Self-pay | Admitting: Registered Nurse

## 2023-04-17 ENCOUNTER — Other Ambulatory Visit: Payer: Self-pay

## 2023-04-17 ENCOUNTER — Other Ambulatory Visit (HOSPITAL_COMMUNITY): Payer: Self-pay

## 2023-04-17 VITALS — BP 101/69 | HR 77 | Ht 61.0 in

## 2023-04-17 DIAGNOSIS — M170 Bilateral primary osteoarthritis of knee: Secondary | ICD-10-CM | POA: Insufficient documentation

## 2023-04-17 DIAGNOSIS — Z79891 Long term (current) use of opiate analgesic: Secondary | ICD-10-CM | POA: Diagnosis not present

## 2023-04-17 DIAGNOSIS — M7061 Trochanteric bursitis, right hip: Secondary | ICD-10-CM | POA: Insufficient documentation

## 2023-04-17 DIAGNOSIS — Z5181 Encounter for therapeutic drug level monitoring: Secondary | ICD-10-CM | POA: Insufficient documentation

## 2023-04-17 DIAGNOSIS — G894 Chronic pain syndrome: Secondary | ICD-10-CM | POA: Diagnosis not present

## 2023-04-17 DIAGNOSIS — M7062 Trochanteric bursitis, left hip: Secondary | ICD-10-CM | POA: Insufficient documentation

## 2023-04-17 MED ORDER — PHENTERMINE HCL 37.5 MG PO TABS
ORAL_TABLET | ORAL | 0 refills | Status: DC
  Filled 2023-04-17: qty 45, 60d supply, fill #0

## 2023-04-17 MED ORDER — OXYCODONE HCL 10 MG PO TABS
10.0000 mg | ORAL_TABLET | Freq: Four times a day (QID) | ORAL | 0 refills | Status: DC | PRN
  Filled 2023-04-17 – 2023-05-04 (×3): qty 120, 30d supply, fill #0

## 2023-04-22 LAB — DRUG TOX MONITOR 1 W/CONF, ORAL FLD
Amphetamines: NEGATIVE ng/mL (ref <10)
Barbiturates: NEGATIVE ng/mL (ref <10)
Benzodiazepines: NEGATIVE ng/mL (ref <0.50)
Buprenorphine: NEGATIVE ng/mL (ref <0.10)
Cocaine: NEGATIVE ng/mL (ref <5.0)
Codeine: NEGATIVE ng/mL (ref <2.5)
Dihydrocodeine: 13.7 ng/mL — ABNORMAL HIGH (ref <2.5)
Fentanyl: NEGATIVE ng/mL (ref <0.10)
Heroin Metabolite: NEGATIVE ng/mL (ref <1.0)
Hydrocodone: 66.5 ng/mL — ABNORMAL HIGH (ref <2.5)
Hydromorphone: NEGATIVE ng/mL (ref <2.5)
MARIJUANA: NEGATIVE ng/mL (ref <2.5)
MDMA: NEGATIVE ng/mL (ref <10)
Meprobamate: NEGATIVE ng/mL (ref <2.5)
Methadone: NEGATIVE ng/mL (ref <5.0)
Morphine: NEGATIVE ng/mL (ref <2.5)
Nicotine Metabolite: NEGATIVE ng/mL (ref <5.0)
Norhydrocodone: 7 ng/mL — ABNORMAL HIGH (ref <2.5)
Noroxycodone: NEGATIVE ng/mL (ref <2.5)
Opiates: POSITIVE ng/mL — AB (ref <2.5)
Oxycodone: NEGATIVE ng/mL (ref <2.5)
Oxymorphone: NEGATIVE ng/mL (ref <2.5)
Phencyclidine: NEGATIVE ng/mL (ref <10)
Tapentadol: NEGATIVE ng/mL (ref <5.0)
Tramadol: NEGATIVE ng/mL (ref <5.0)
Zolpidem: 7.1 ng/mL — ABNORMAL HIGH (ref <5.0)
Zolpidem: POSITIVE ng/mL — AB (ref <5.0)

## 2023-04-22 LAB — DRUG TOX ALC METAB W/CON, ORAL FLD: Alcohol Metabolite: NEGATIVE ng/mL (ref <25)

## 2023-04-23 ENCOUNTER — Other Ambulatory Visit (HOSPITAL_COMMUNITY): Payer: Self-pay

## 2023-04-24 ENCOUNTER — Other Ambulatory Visit: Payer: Self-pay

## 2023-04-24 ENCOUNTER — Other Ambulatory Visit (HOSPITAL_COMMUNITY): Payer: Self-pay

## 2023-04-24 MED ORDER — ZOLPIDEM TARTRATE 10 MG PO TABS
10.0000 mg | ORAL_TABLET | Freq: Every evening | ORAL | 3 refills | Status: DC | PRN
  Filled 2023-04-24: qty 30, 30d supply, fill #0
  Filled 2023-05-28: qty 30, 30d supply, fill #1
  Filled 2023-07-02: qty 30, 30d supply, fill #2
  Filled 2023-08-06: qty 30, 30d supply, fill #3

## 2023-04-27 ENCOUNTER — Other Ambulatory Visit: Payer: Self-pay

## 2023-04-28 ENCOUNTER — Other Ambulatory Visit (HOSPITAL_COMMUNITY): Payer: Self-pay

## 2023-04-30 ENCOUNTER — Other Ambulatory Visit (HOSPITAL_COMMUNITY): Payer: Self-pay

## 2023-04-30 ENCOUNTER — Other Ambulatory Visit: Payer: Self-pay

## 2023-04-30 MED ORDER — TROSPIUM CHLORIDE ER 60 MG PO CP24
60.0000 mg | ORAL_CAPSULE | Freq: Every day | ORAL | 3 refills | Status: DC
  Filled 2023-04-30 – 2023-05-04 (×2): qty 30, 30d supply, fill #0
  Filled 2023-05-28: qty 30, 30d supply, fill #1
  Filled 2023-07-02: qty 30, 30d supply, fill #2
  Filled 2023-08-01: qty 30, 30d supply, fill #3

## 2023-05-03 ENCOUNTER — Other Ambulatory Visit: Payer: Self-pay

## 2023-05-04 ENCOUNTER — Other Ambulatory Visit (HOSPITAL_COMMUNITY): Payer: Self-pay

## 2023-05-04 ENCOUNTER — Other Ambulatory Visit: Payer: Self-pay

## 2023-05-28 ENCOUNTER — Other Ambulatory Visit: Payer: Self-pay

## 2023-05-28 ENCOUNTER — Other Ambulatory Visit (HOSPITAL_COMMUNITY): Payer: Self-pay

## 2023-05-29 ENCOUNTER — Other Ambulatory Visit: Payer: Self-pay

## 2023-05-29 ENCOUNTER — Encounter: Payer: Self-pay | Admitting: Pharmacist

## 2023-05-30 ENCOUNTER — Other Ambulatory Visit: Payer: Self-pay

## 2023-05-30 ENCOUNTER — Telehealth: Payer: Self-pay | Admitting: Registered Nurse

## 2023-05-30 ENCOUNTER — Other Ambulatory Visit (HOSPITAL_COMMUNITY): Payer: Self-pay

## 2023-05-30 MED ORDER — OXYCODONE HCL 10 MG PO TABS
10.0000 mg | ORAL_TABLET | Freq: Four times a day (QID) | ORAL | 0 refills | Status: DC | PRN
  Filled 2023-05-30: qty 120, 30d supply, fill #0

## 2023-05-30 NOTE — Telephone Encounter (Signed)
 PMP was Reviewed.  Oxycodone e-scribed to pharmacy. [name] is aware via My- Chart message.

## 2023-05-31 ENCOUNTER — Other Ambulatory Visit (HOSPITAL_COMMUNITY): Payer: Self-pay

## 2023-05-31 ENCOUNTER — Other Ambulatory Visit: Payer: Self-pay

## 2023-06-05 DIAGNOSIS — L400 Psoriasis vulgaris: Secondary | ICD-10-CM | POA: Diagnosis not present

## 2023-06-05 DIAGNOSIS — L649 Androgenic alopecia, unspecified: Secondary | ICD-10-CM | POA: Diagnosis not present

## 2023-06-05 DIAGNOSIS — L739 Follicular disorder, unspecified: Secondary | ICD-10-CM | POA: Diagnosis not present

## 2023-06-06 ENCOUNTER — Other Ambulatory Visit: Payer: Self-pay

## 2023-06-11 NOTE — Progress Notes (Unsigned)
 Subjective:    Patient ID: Anita Booker, female    DOB: 1960/03/26, 63 y.o.   MRN: [id number]  HPI: Anita Booker is a 63 y.o. female who returns for follow up appointment for chronic pain and medication refill.She  states her pain is located in her bilateral hips and bilateral knee pain. She  rates her pain 6. Her current exercise regime is walking and performing stretching exercises.  [name] Morphine equivalent is 60.00 MME.   Last Oral Swab was Performed on 04/17/2023, + hydrocodone. [name] reportplaced to [name] s she was given Hydrocodone Elixir , PMP was Reviewed. Hydrocodone not listed, [facility], spoke with pharmacist. Hydrocodone elxir not ordered. Call placed to Anita Booker, awaiting a return call.   [name] return this provider call, she looked at the Hydrocodone elxir bottle, she states it has her husband name on it. She admits she and her husband was sick and she took the hydrocodone elxir and not her Oxycodone at the time. Narcotic Contract was reviewed, she verbalizes understanding. She will receive a warning letter and if this occurs again she will be discharged. She verbalizes understanding.      Pain Inventory Average Pain 6 Pain Right Now 6 My pain is dull, aching, and throbbing  In the last 24 hours, has pain interfered with the following? General activity 5 Relation with others 0 Enjoyment of life 5 What TIME of day is your pain at its worst? night Sleep (in general) Poor  Pain is worse with: walking and standing Pain improves with: rest, heat/ice, and medication Relief from Meds: 5  Family History  Problem Relation Age of Onset   Cancer Mother    Thyroid disease Mother    Stroke Mother    Hypertension Mother    Diabetes Mother    Uterine cancer Mother 62   Obesity Mother    Obesity Father    Hypertension Father    Lung cancer Father 35   Cancer Father        lung   Breast cancer Sister  75       identical twin   Healthy Sister    Healthy Brother    Heart attack Brother 48   Cancer Paternal Grandmother        breast   Lung cancer Paternal Uncle        all four uncles had lung cancer and were smokers   Breast cancer Cousin        paternal cousin dx in her 68s   Liver cancer Cousin        paternal cousin with hepatitis and liver cancer   Breast cancer Other    Social History   Socioeconomic History   Marital status: Married    Spouse name: Anita Booker   Number of children: 5   Years of education: Not on file   Highest education level: Not on file  Occupational History   Occupation: RN  Tobacco Use   Smoking status: Never   Smokeless tobacco: Never  Vaping Use   Vaping status: Never Used  Substance and Sexual Activity   Alcohol use: No   Drug use: No   Sexual activity: Not on file  Other Topics Concern   Not on file  Social History Narrative   Right handed   Social Drivers of Health   Financial Resource Strain: Patient Declined (10/22/2022)   Received from [facility]   Overall Financial  Resource Strain (CARDIA)    Difficulty of Paying Living Expenses: Patient declined  Food Insecurity: Patient Declined (10/22/2022)   Received from [facility]   Hunger Vital Sign    Worried About Running Out of Food in the Last Year: Patient declined    Ran Out of Food in the Last Year: Patient declined  Transportation Needs: No Transportation Needs (10/22/2022)   Received from [facility] - Transportation    Lack of Transportation (Medical): No    Lack of Transportation (Non-Medical): No  Physical Activity: Unknown (10/22/2022)   Received from [facility]   Exercise Vital Sign    Days of Exercise per Week: Patient declined    Minutes of Exercise per Session: 10 min  Stress: Patient Declined (10/22/2022)   Received from [facility] - Occupational Stress Questionnaire    Feeling of Stress : Patient  declined  Social Connections: Moderately Integrated (10/22/2022)   Received from [facility]   Social Network    How would you rate your social network (family, work, friends)?: Adequate participation with social networks   Past Surgical History:  Procedure Laterality Date   BREAST EXCISIONAL BIOPSY Right    infection to right breast 5 years ago.   BREAST SURGERY  07/01/2011.   I & D of right breast abscess   CHOLECYSTECTOMY  1995   COLONOSCOPY WITH PROPOFOL N/A 07/05/2016   Procedure: COLONOSCOPY WITH PROPOFOL;  Surgeon: Willis Modena, MD;  Location: [facility];  Service: Endoscopy;  Laterality: N/A;   LEFT HEART CATH AND CORONARY ANGIOGRAPHY N/A 03/26/2017   Procedure: LEFT HEART CATH AND CORONARY ANGIOGRAPHY;  Surgeon: Yvonne Kendall, MD;  [facility];  Service: Cardiovascular;  Laterality: N/A;   Past Surgical History:  Procedure Laterality Date   BREAST EXCISIONAL BIOPSY Right    infection to right breast 5 years ago.   BREAST SURGERY  07/01/2011.   I & D of right breast abscess   CHOLECYSTECTOMY  1995   COLONOSCOPY WITH PROPOFOL N/A 07/05/2016   Procedure: COLONOSCOPY WITH PROPOFOL;  Surgeon: Willis Modena, MD;  Location: [facility];  Service: Endoscopy;  Laterality: N/A;   LEFT HEART CATH AND CORONARY ANGIOGRAPHY N/A 03/26/2017   Procedure: LEFT HEART CATH AND CORONARY ANGIOGRAPHY;  Surgeon: Yvonne Kendall, MD;  [facility];  Service: Cardiovascular;  Laterality: N/A;   Past Medical History:  Diagnosis Date   Abnormal nuclear cardiac imaging test 03/23/2017   Arthritis    Bilateral primary osteoarthritis of knee 06/05/2016   Cellulitis and abscess of trunk 04/30/2012   Chest pain 02/13/2017   Chronic pain of left knee 10/31/2011   Significant medial compartment DJD bilaterally. Both knees had steroid injection done on October 31, 2011   Chronic pain syndrome 09/28/2016   Complication of anesthesia 1995   woke up on table right after  gallbladder surgery   Constipation    Corneal ulcer 08/06/2012   Edema, lower extremity    Essential hypertension 02/13/2017   Family history of adverse reaction to anesthesia    sister coded twice with anesthesai not sure of details sister still living   Family history of breast cancer    Family history of uterine cancer    Gallbladder problem    Genetic testing 09/07/2016   Negative genetic testing on the Common Hereditary cancer panel.  The Hereditary Gene Panel offered by [facility] includes sequencing and/or deletion duplication testing of the following 46  genes: APC, ATM, AXIN2, BARD1, BMPR1A, BRCA1, BRCA2, BRIP1, CDH1, CDKN2A (p14ARF), CDKN2A (p16INK4a), CHEK2, CTNNA1, DICER1, EPCAM (Deletion/duplication testing only), GREM1 (promoter region deletion/duplication te   Headache    migraines   Joint pain    Knee pain    Leg pain    Migraine    Mixed dyslipidemia 02/13/2017   Morbid obesity (HCC) 02/13/2017   Osteoarthritis    Sleep apnea    SOBOE (shortness of breath on exertion)    There were no vitals taken for this visit.  Opioid Risk Score:   Fall Risk Score:  `1  Depression screen [facility] 2/9     04/17/2023   11:38 AM 07/11/2022   10:56 AM 10/04/2021   11:00 AM 07/04/2021   11:27 AM 04/12/2021   12:01 PM 04/05/2021    8:43 AM 08/21/2018   11:49 AM  Depression screen PHQ 2/9  Decreased Interest 0 0 0 0 0 3 0  Down, Depressed, Hopeless 0 0 0 0 0 1 0  PHQ - 2 Score 0 0 0 0 0 4 0  Altered sleeping      3   Tired, decreased energy      3   Change in appetite      1   Feeling bad or failure about yourself       1   Trouble concentrating      0   Moving slowly or fidgety/restless      0   Suicidal thoughts      0   PHQ-9 Score      12   Difficult doing work/chores      Very difficult     Review of Systems  Musculoskeletal:        Pain on both knees  Neurological:  Positive for headaches.       Objective:   Physical Exam Vitals and nursing note reviewed.   Constitutional:      Appearance: Normal appearance.  Neurological:     Mental Status: She is alert.           Assessment & Plan:  1. 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 Continue HEP as Tolerated. 06/13/2023 RX: Oxycodone 10 mg one tablet 4 times a day as needed for pain #120. Belbuca 450 MCG Film inside cheek every 12 hours #60. Discontinued due to Financial Hardship.  This was discussed with Dr [name] and he agrees with plan.  We will continue the opioid monitoring program, this consists of regular clinic visits, examinations, urine drug screen, pill counts as well as use of [place] Controlled Substance Reporting system. A 12 month History has been reviewed on the [place] Controlled Substance Reporting System on 06/13/2023. 2. Bilateral Hip osteoarthritis: Continue HEP as Tolerated. Continue to Monitor. 06/13/2023  3. Bilateral Greater Trochanteric Tenderness: Continue to Alternate Ice and Heat Therapy. Continue to Monitor. 06/13/2023 4. 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 Neurology Following. Continue to Monitor. 06/13/2023   F/U in 2 months: Financial Hardship

## 2023-06-13 ENCOUNTER — Encounter: Payer: HMO | Attending: Registered Nurse | Admitting: Registered Nurse

## 2023-06-13 ENCOUNTER — Encounter: Payer: Self-pay | Admitting: Registered Nurse

## 2023-06-13 ENCOUNTER — Telehealth: Payer: Self-pay | Admitting: Registered Nurse

## 2023-06-13 VITALS — BP 110/73 | HR 68 | Ht 61.0 in | Wt 252.0 lb

## 2023-06-13 DIAGNOSIS — M7062 Trochanteric bursitis, left hip: Secondary | ICD-10-CM

## 2023-06-13 DIAGNOSIS — M170 Bilateral primary osteoarthritis of knee: Secondary | ICD-10-CM | POA: Diagnosis not present

## 2023-06-13 DIAGNOSIS — M7061 Trochanteric bursitis, right hip: Secondary | ICD-10-CM

## 2023-06-13 DIAGNOSIS — Z79891 Long term (current) use of opiate analgesic: Secondary | ICD-10-CM | POA: Diagnosis not present

## 2023-06-13 DIAGNOSIS — Z5181 Encounter for therapeutic drug level monitoring: Secondary | ICD-10-CM

## 2023-06-13 DIAGNOSIS — G894 Chronic pain syndrome: Secondary | ICD-10-CM

## 2023-06-13 NOTE — Telephone Encounter (Signed)
 Last Oral Swab + Hydrocodone elxir,  [name] states she was given Hydrocodone elxir prescription, PMP was Reviewed. No Hydrocodone listed, [facility], no Hydrocodone elxir was dispensed.  A call was placed to [facility], awaiting a return call  Will await [name] return call and have Sybil send her a Engineer, manufacturing systems.

## 2023-06-14 ENCOUNTER — Other Ambulatory Visit (HOSPITAL_COMMUNITY): Payer: Self-pay

## 2023-06-15 ENCOUNTER — Other Ambulatory Visit (HOSPITAL_COMMUNITY): Payer: Self-pay

## 2023-06-19 ENCOUNTER — Other Ambulatory Visit (HOSPITAL_COMMUNITY): Payer: Self-pay

## 2023-06-19 ENCOUNTER — Other Ambulatory Visit: Payer: Self-pay

## 2023-06-19 MED ORDER — NORTRIPTYLINE HCL 50 MG PO CAPS
50.0000 mg | ORAL_CAPSULE | Freq: Every day | ORAL | 1 refills | Status: DC
  Filled 2023-06-19: qty 100, 100d supply, fill #0
  Filled 2023-07-18: qty 60, 60d supply, fill #1
  Filled 2023-09-03: qty 60, 60d supply, fill #2

## 2023-06-26 ENCOUNTER — Other Ambulatory Visit: Payer: Self-pay | Admitting: Obstetrics and Gynecology

## 2023-06-26 DIAGNOSIS — Z6841 Body Mass Index (BMI) 40.0 and over, adult: Secondary | ICD-10-CM | POA: Diagnosis not present

## 2023-06-26 DIAGNOSIS — I10 Essential (primary) hypertension: Secondary | ICD-10-CM | POA: Diagnosis not present

## 2023-06-26 DIAGNOSIS — Z636 Dependent relative needing care at home: Secondary | ICD-10-CM | POA: Diagnosis not present

## 2023-06-26 DIAGNOSIS — E66813 Obesity, class 3: Secondary | ICD-10-CM | POA: Diagnosis not present

## 2023-06-26 DIAGNOSIS — Z1231 Encounter for screening mammogram for malignant neoplasm of breast: Secondary | ICD-10-CM

## 2023-06-26 DIAGNOSIS — G4733 Obstructive sleep apnea (adult) (pediatric): Secondary | ICD-10-CM | POA: Diagnosis not present

## 2023-06-26 DIAGNOSIS — R632 Polyphagia: Secondary | ICD-10-CM | POA: Diagnosis not present

## 2023-06-26 NOTE — Telephone Encounter (Signed)
 Formal warning letter sent through [employer].

## 2023-06-29 ENCOUNTER — Other Ambulatory Visit (HOSPITAL_COMMUNITY): Payer: Self-pay

## 2023-06-29 ENCOUNTER — Telehealth: Payer: Self-pay | Admitting: Registered Nurse

## 2023-06-29 ENCOUNTER — Other Ambulatory Visit: Payer: Self-pay | Admitting: Registered Nurse

## 2023-06-29 MED ORDER — OXYCODONE HCL 10 MG PO TABS
10.0000 mg | ORAL_TABLET | Freq: Four times a day (QID) | ORAL | 0 refills | Status: DC | PRN
  Filled 2023-06-29 – 2023-07-04 (×2): qty 120, 30d supply, fill #0

## 2023-06-29 NOTE — Telephone Encounter (Signed)
 PMP was Reviewed.  Oxycodone e-scribed to pharmacy. [name] is aware via My- Chart message.

## 2023-07-02 ENCOUNTER — Other Ambulatory Visit: Payer: Self-pay

## 2023-07-02 ENCOUNTER — Other Ambulatory Visit (HOSPITAL_COMMUNITY): Payer: Self-pay

## 2023-07-03 ENCOUNTER — Other Ambulatory Visit: Payer: Self-pay

## 2023-07-03 ENCOUNTER — Other Ambulatory Visit (HOSPITAL_BASED_OUTPATIENT_CLINIC_OR_DEPARTMENT_OTHER): Payer: Self-pay

## 2023-07-03 ENCOUNTER — Other Ambulatory Visit (HOSPITAL_COMMUNITY): Payer: Self-pay

## 2023-07-03 DIAGNOSIS — R053 Chronic cough: Secondary | ICD-10-CM | POA: Diagnosis not present

## 2023-07-03 MED ORDER — BENZONATATE 100 MG PO CAPS
100.0000 mg | ORAL_CAPSULE | Freq: Three times a day (TID) | ORAL | 0 refills | Status: DC | PRN
  Filled 2023-07-03: qty 20, 7d supply, fill #0

## 2023-07-03 MED ORDER — QVAR REDIHALER 80 MCG/ACT IN AERB
INHALATION_SPRAY | RESPIRATORY_TRACT | 11 refills | Status: DC
  Filled 2023-07-03: qty 10.6, 30d supply, fill #0

## 2023-07-03 MED ORDER — OMEPRAZOLE 40 MG PO CPDR
40.0000 mg | DELAYED_RELEASE_CAPSULE | Freq: Every day | ORAL | 0 refills | Status: DC
  Filled 2023-07-03: qty 30, 30d supply, fill #0

## 2023-07-03 MED ORDER — SPIRONOLACTONE 100 MG PO TABS
100.0000 mg | ORAL_TABLET | Freq: Every day | ORAL | 3 refills | Status: AC
  Filled 2023-07-03: qty 90, 90d supply, fill #0
  Filled 2023-11-06: qty 90, 90d supply, fill #1
  Filled 2024-02-08: qty 90, 90d supply, fill #2

## 2023-07-03 MED ORDER — VALSARTAN 320 MG PO TABS
320.0000 mg | ORAL_TABLET | Freq: Every day | ORAL | 3 refills | Status: DC
  Filled 2023-07-03: qty 90, 90d supply, fill #0

## 2023-07-03 MED ORDER — CELECOXIB 200 MG PO CAPS
200.0000 mg | ORAL_CAPSULE | Freq: Every day | ORAL | 3 refills | Status: AC
  Filled 2023-07-03: qty 90, 90d supply, fill #0
  Filled 2023-08-06 – 2023-12-18 (×2): qty 90, 90d supply, fill #1
  Filled 2024-03-19: qty 90, 90d supply, fill #2

## 2023-07-03 MED ORDER — CLOBETASOL PROPIONATE 0.05 % EX SOLN
CUTANEOUS | 5 refills | Status: AC
  Filled 2023-07-03 – 2023-07-07 (×2): qty 50, 30d supply, fill #0
  Filled 2024-02-25: qty 50, 30d supply, fill #1

## 2023-07-03 MED ORDER — TRETINOIN 0.1 % EX CREA
1.0000 | TOPICAL_CREAM | Freq: Every day | CUTANEOUS | 5 refills | Status: AC
  Filled 2023-07-03: qty 45, 30d supply, fill #0
  Filled 2023-07-12: qty 20, 30d supply, fill #0
  Filled 2023-09-25 – 2024-03-19 (×5): qty 45, 30d supply, fill #0
  Filled 2024-03-28 – 2024-04-16 (×3): qty 45, 30d supply, fill #1

## 2023-07-03 MED ORDER — ALBUTEROL SULFATE HFA 108 (90 BASE) MCG/ACT IN AERS
2.0000 | INHALATION_SPRAY | Freq: Four times a day (QID) | RESPIRATORY_TRACT | 11 refills | Status: AC | PRN
  Filled 2023-07-03: qty 6.7, 30d supply, fill #0
  Filled 2024-03-02: qty 6.7, 30d supply, fill #1

## 2023-07-04 ENCOUNTER — Other Ambulatory Visit (HOSPITAL_COMMUNITY): Payer: Self-pay

## 2023-07-04 ENCOUNTER — Other Ambulatory Visit: Payer: Self-pay

## 2023-07-06 ENCOUNTER — Other Ambulatory Visit (HOSPITAL_COMMUNITY): Payer: Self-pay

## 2023-07-07 ENCOUNTER — Other Ambulatory Visit (HOSPITAL_COMMUNITY): Payer: Self-pay

## 2023-07-07 ENCOUNTER — Other Ambulatory Visit (HOSPITAL_BASED_OUTPATIENT_CLINIC_OR_DEPARTMENT_OTHER): Payer: Self-pay

## 2023-07-09 ENCOUNTER — Other Ambulatory Visit (HOSPITAL_COMMUNITY): Payer: Self-pay

## 2023-07-09 ENCOUNTER — Other Ambulatory Visit: Payer: Self-pay

## 2023-07-11 ENCOUNTER — Other Ambulatory Visit (HOSPITAL_COMMUNITY): Payer: Self-pay

## 2023-07-12 ENCOUNTER — Other Ambulatory Visit: Payer: Self-pay

## 2023-07-13 ENCOUNTER — Other Ambulatory Visit (HOSPITAL_COMMUNITY): Payer: Self-pay

## 2023-07-13 ENCOUNTER — Encounter (HOSPITAL_COMMUNITY): Payer: Self-pay

## 2023-07-16 ENCOUNTER — Other Ambulatory Visit: Payer: Self-pay

## 2023-07-18 ENCOUNTER — Other Ambulatory Visit: Payer: Self-pay

## 2023-07-18 ENCOUNTER — Other Ambulatory Visit (HOSPITAL_COMMUNITY): Payer: Self-pay

## 2023-07-18 ENCOUNTER — Ambulatory Visit
Admission: RE | Admit: 2023-07-18 | Discharge: 2023-07-18 | Disposition: A | Discharge status: Home / Self Care | Source: Ambulatory Visit | Attending: Obstetrics and Gynecology | Admitting: Obstetrics and Gynecology

## 2023-07-18 DIAGNOSIS — Z1231 Encounter for screening mammogram for malignant neoplasm of breast: Secondary | ICD-10-CM

## 2023-07-18 MED ORDER — NORTRIPTYLINE HCL 50 MG PO CAPS
50.0000 mg | ORAL_CAPSULE | Freq: Every evening | ORAL | 1 refills | Status: DC
Start: 2023-07-18 — End: 2024-01-31
  Filled 2023-07-18 – 2024-01-02 (×4): qty 180, 180d supply, fill #0

## 2023-07-19 ENCOUNTER — Other Ambulatory Visit (HOSPITAL_COMMUNITY): Payer: Self-pay

## 2023-07-25 NOTE — Progress Notes (Signed)
 Subjective:    Patient ID: Anita Booker, female    DOB: 1960-10-15, 63 y.o.   MRN: [id number]  HPI: Anita Booker is a 63 y.o. female who returns for follow up appointment for chronic pain and medication refill. She states her pain is located in her bilateral hips and bilateral knee pain. She rates her pain 6. Her current exercise regime is walking and performing stretching exercises.  [name]- McSweeney Morphine  equivalent is 60.00 MME.   Oral Swab was Performed today.     Pain Inventory Average Pain 4 Pain Right Now 6 My pain is constant, sharp, burning, stabbing, and aching  In the last 24 hours, has pain interfered with the following? General activity 7 Relation with others 6 Enjoyment of life 6 What TIME of day is your pain at its worst? varies Sleep (in general) NA  Pain is worse with: walking, bending, and standing Pain improves with: rest, heat/ice, and medication Relief from Meds: 4  Family History  Problem Relation Age of Onset   Cancer Mother    Thyroid  disease Mother    Stroke Mother    Hypertension Mother    Diabetes Mother    Uterine cancer Mother 23   Obesity Mother    Obesity Father    Hypertension Father    Lung cancer Father 28   Cancer Father        lung   Breast cancer Sister 19       identical twin   Healthy Sister    Healthy Brother    Heart attack Brother 48   Cancer Paternal Grandmother        breast   Lung cancer Paternal Uncle        all four uncles had lung cancer and were smokers   Breast cancer Cousin        paternal cousin dx in her 66s   Liver cancer Cousin        paternal cousin with hepatitis and liver cancer   Breast cancer Other    Social History   Socioeconomic History   Marital status: Married    Spouse name: Anita Booker   Number of children: 5   Years of education: Not on file   Highest education level: Not on file  Occupational History   Occupation: RN  Tobacco Use   Smoking  status: Never   Smokeless tobacco: Never  Vaping Use   Vaping status: Never Used  Substance and Sexual Activity   Alcohol use: No   Drug use: No   Sexual activity: Not on file  Other Topics Concern   Not on file  Social History Narrative   Right handed   Social Drivers of Health   Financial Resource Strain: Patient Declined (10/22/2022)   Received from [facility]   Overall Financial Resource Strain (CARDIA)    Difficulty of Paying Living Expenses: Patient declined  Food Insecurity: Low Risk  (07/03/2023)   Received from [facility]   Hunger Vital Sign    Worried About Running Out of Food in the Last Year: Never true    Ran Out of Food in the Last Year: Never true  Transportation Needs: No Transportation Needs (07/03/2023)   Received from [facility]    In the past 12 months, has lack of reliable transportation kept you from medical appointments, meetings, work or from getting things needed for daily living? : No  Physical Activity: Unknown (10/22/2022)  Received from [facility]   Exercise Vital Sign    Days of Exercise per Week: Patient declined    Minutes of Exercise per Session: 10 min  Stress: Patient Declined (10/22/2022)   Received from [facility] - Occupational Stress Questionnaire    Feeling of Stress : Patient declined  Social Connections: Moderately Integrated (10/22/2022)   Received from [facility]   Social Network    How would you rate your social network (family, work, friends)?: Adequate participation with social networks   Past Surgical History:  Procedure Laterality Date   BREAST EXCISIONAL BIOPSY Right    infection to right breast 5 years ago.   BREAST SURGERY  07/01/2011.   I & D of right breast abscess   CHOLECYSTECTOMY  1995   COLONOSCOPY WITH PROPOFOL  N/A 07/05/2016   Procedure: COLONOSCOPY WITH PROPOFOL ;  Surgeon: Evangeline Hilts, MD;  Location: [facility];  Service: Endoscopy;   Laterality: N/A;   LEFT HEART CATH AND CORONARY ANGIOGRAPHY N/A 03/26/2017   Procedure: LEFT HEART CATH AND CORONARY ANGIOGRAPHY;  Surgeon: Sammy Crisp, MD;  [facility];  Service: Cardiovascular;  Laterality: N/A;   Past Surgical History:  Procedure Laterality Date   BREAST EXCISIONAL BIOPSY Right    infection to right breast 5 years ago.   BREAST SURGERY  07/01/2011.   I & D of right breast abscess   CHOLECYSTECTOMY  1995   COLONOSCOPY WITH PROPOFOL  N/A 07/05/2016   Procedure: COLONOSCOPY WITH PROPOFOL ;  Surgeon: Evangeline Hilts, MD;  [facility];  Service: Endoscopy;  Laterality: N/A;   LEFT HEART CATH AND CORONARY ANGIOGRAPHY N/A 03/26/2017   Procedure: LEFT HEART CATH AND CORONARY ANGIOGRAPHY;  Surgeon: Sammy Crisp, MD;  Location: [facility];  Service: Cardiovascular;  Laterality: N/A;   Past Medical History:  Diagnosis Date   Abnormal nuclear cardiac imaging test 03/23/2017   Arthritis    Bilateral primary osteoarthritis of knee 06/05/2016   Cellulitis and abscess of trunk 04/30/2012   Chest pain 02/13/2017   Chronic pain of left knee 10/31/2011   Significant medial compartment DJD bilaterally. Both knees had steroid injection done on October 31, 2011   Chronic pain syndrome 09/28/2016   Complication of anesthesia 1995   woke up on table right after gallbladder surgery   Constipation    Corneal ulcer 08/06/2012   Edema, lower extremity    Essential hypertension 02/13/2017   Family history of adverse reaction to anesthesia    sister coded twice with anesthesai not sure of details sister still living   Family history of breast cancer    Family history of uterine cancer    Gallbladder problem    Genetic testing 09/07/2016   Negative genetic testing on the Common Hereditary cancer panel.  The Hereditary Gene Panel offered by [facility] includes sequencing and/or deletion duplication testing of the following 46 genes: APC, ATM, AXIN2, BARD1,  BMPR1A, BRCA1, BRCA2, BRIP1, CDH1, CDKN2A (p14ARF), CDKN2A (p16INK4a), CHEK2, CTNNA1, DICER1, EPCAM (Deletion/duplication testing only), GREM1 (promoter region deletion/duplication te   Headache    migraines   Joint pain    Knee pain    Leg pain    Migraine    Mixed dyslipidemia 02/13/2017   Morbid obesity (HCC) 02/13/2017   Osteoarthritis    Sleep apnea    SOBOE (shortness of breath on exertion)    BP 104/72   Pulse 76   Ht 5\' 1"  (1.549 m)   Wt  252 lb (114.3 kg) Comment: last recorded  LMP  (LMP Unknown)   SpO2 96%   BMI 47.61 kg/m   Opioid Risk Score:   Fall Risk Score:  `1  Depression screen [facility] 2/9     07/26/2023   11:20 AM 04/17/2023   11:38 AM 07/11/2022   10:56 AM 10/04/2021   11:00 AM 07/04/2021   11:27 AM 04/12/2021   12:01 PM 04/05/2021    8:43 AM  Depression screen PHQ 2/9  Decreased Interest 0 0 0 0 0 0 3  Down, Depressed, Hopeless 0 0 0 0 0 0 1  PHQ - 2 Score 0 0 0 0 0 0 4  Altered sleeping       3  Tired, decreased energy       3  Change in appetite       1  Feeling bad or failure about yourself        1  Trouble concentrating       0  Moving slowly or fidgety/restless       0  Suicidal thoughts       0  PHQ-9 Score       12  Difficult doing work/chores       Very difficult     Review of Systems  Musculoskeletal:        Bilateral hips and legs  All other systems reviewed and are negative.      Objective:   Physical Exam Vitals and nursing note reviewed.  Constitutional:      Appearance: Normal appearance.  Cardiovascular:     Rate and Rhythm: Normal rate and regular rhythm.     Pulses: Normal pulses.     Heart sounds: Normal heart sounds.  Pulmonary:     Effort: Pulmonary effort is normal.     Breath sounds: Normal breath sounds.  Musculoskeletal:     Comments: Normal Muscle Bulk and Muscle Testing Reveals:  Upper Extremities: Full ROM and Muscle Strength 5/5 Lower Extremities: Decreased ROM and Muscle Strength 5/5 Bilateral Lower  Extremities Flexion Produces Pain into her Bilateral Patella's Arrived in Scooter      Skin:    General: Skin is warm and dry.  Neurological:     Mental Status: She is alert and oriented to person, place, and time.  Psychiatric:        Mood and Affect: Mood normal.        Behavior: Behavior normal.         Assessment & Plan:  1. 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 Continue HEP as Tolerated. 07/26/2023 RX: Oxycodone  10 mg one tablet 4 times a day as needed for pain #120. Belbuca  450 MCG Film inside cheek every 12 hours #60. Discontinued due to Financial Hardship.  This was discussed with Dr Sharl Davies and he agrees with plan.  We will continue the opioid monitoring program, this consists of regular clinic visits, examinations, urine drug screen, pill counts as well as use of [place]  Controlled Substance Reporting system. A 12 month History has been reviewed on the [place]  Controlled Substance Reporting System on 07/26/2023. 2. Bilateral Hip osteoarthritis: Continue HEP as Tolerated. Continue to Monitor. 07/26/2023  3. Bilateral Greater Trochanteric Tenderness: Continue to Alternate Ice and Heat Therapy. Continue to Monitor. 07/26/2023 4. 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 Neurology Following. Continue to Monitor. 07/26/2023   F/U in 2 months: Financial Hardship

## 2023-07-26 ENCOUNTER — Encounter: Attending: Registered Nurse | Admitting: Registered Nurse

## 2023-07-26 ENCOUNTER — Encounter: Payer: Self-pay | Admitting: Registered Nurse

## 2023-07-26 ENCOUNTER — Other Ambulatory Visit (HOSPITAL_COMMUNITY): Payer: Self-pay

## 2023-07-26 VITALS — BP 104/72 | HR 76 | Ht 61.0 in | Wt 252.0 lb

## 2023-07-26 DIAGNOSIS — Z5181 Encounter for therapeutic drug level monitoring: Secondary | ICD-10-CM | POA: Diagnosis not present

## 2023-07-26 DIAGNOSIS — M7061 Trochanteric bursitis, right hip: Secondary | ICD-10-CM | POA: Insufficient documentation

## 2023-07-26 DIAGNOSIS — G894 Chronic pain syndrome: Secondary | ICD-10-CM | POA: Insufficient documentation

## 2023-07-26 DIAGNOSIS — M7062 Trochanteric bursitis, left hip: Secondary | ICD-10-CM | POA: Insufficient documentation

## 2023-07-26 DIAGNOSIS — M170 Bilateral primary osteoarthritis of knee: Secondary | ICD-10-CM | POA: Insufficient documentation

## 2023-07-26 DIAGNOSIS — Z79891 Long term (current) use of opiate analgesic: Secondary | ICD-10-CM | POA: Insufficient documentation

## 2023-07-26 MED ORDER — OXYCODONE HCL 10 MG PO TABS
10.0000 mg | ORAL_TABLET | Freq: Four times a day (QID) | ORAL | 0 refills | Status: DC | PRN
  Filled 2023-07-26 – 2023-08-06 (×2): qty 120, 30d supply, fill #0

## 2023-07-26 MED ORDER — OXYCODONE HCL 10 MG PO TABS: 10.0000 mg | ORAL_TABLET | Freq: Four times a day (QID) | ORAL | 0 refills | Status: DC | PRN

## 2023-07-30 ENCOUNTER — Other Ambulatory Visit (HOSPITAL_COMMUNITY): Payer: Self-pay

## 2023-07-30 LAB — DRUG TOX MONITOR 1 W/CONF, ORAL FLD
Amphetamines: NEGATIVE ng/mL (ref <10)
Barbiturates: NEGATIVE ng/mL (ref <10)
Benzodiazepines: NEGATIVE ng/mL (ref <0.50)
Buprenorphine: NEGATIVE ng/mL (ref <0.10)
Cocaine: NEGATIVE ng/mL (ref <5.0)
Codeine: NEGATIVE ng/mL (ref <2.5)
Dihydrocodeine: NEGATIVE ng/mL (ref <2.5)
Fentanyl: NEGATIVE ng/mL (ref <0.10)
Heroin Metabolite: NEGATIVE ng/mL (ref <1.0)
Hydrocodone: NEGATIVE ng/mL (ref <2.5)
Hydromorphone: NEGATIVE ng/mL (ref <2.5)
MARIJUANA: NEGATIVE ng/mL (ref <2.5)
MDMA: NEGATIVE ng/mL (ref <10)
Meprobamate: NEGATIVE ng/mL (ref <2.5)
Methadone: NEGATIVE ng/mL (ref <5.0)
Morphine: NEGATIVE ng/mL (ref <2.5)
Nicotine Metabolite: NEGATIVE ng/mL (ref <5.0)
Norhydrocodone: NEGATIVE ng/mL (ref <2.5)
Noroxycodone: 4.6 ng/mL — ABNORMAL HIGH (ref <2.5)
Opiates: POSITIVE ng/mL — AB (ref <2.5)
Oxycodone: >250 ng/mL — ABNORMAL HIGH (ref <2.5)
Oxymorphone: NEGATIVE ng/mL (ref <2.5)
Phencyclidine: NEGATIVE ng/mL (ref <10)
Tapentadol: NEGATIVE ng/mL (ref <5.0)
Tramadol: NEGATIVE ng/mL (ref <5.0)
Zolpidem: NEGATIVE ng/mL (ref <5.0)

## 2023-07-30 LAB — DRUG TOX ALC METAB W/CON, ORAL FLD: Alcohol Metabolite: NEGATIVE ng/mL (ref <25)

## 2023-08-01 ENCOUNTER — Other Ambulatory Visit (HOSPITAL_COMMUNITY): Payer: Self-pay

## 2023-08-02 ENCOUNTER — Other Ambulatory Visit (HOSPITAL_COMMUNITY): Payer: Self-pay

## 2023-08-02 ENCOUNTER — Other Ambulatory Visit: Payer: Self-pay

## 2023-08-02 MED ORDER — PHENTERMINE HCL 37.5 MG PO TABS
ORAL_TABLET | ORAL | 0 refills | Status: DC
  Filled 2023-08-02 – 2023-08-06 (×2): qty 45, 30d supply, fill #0

## 2023-08-06 ENCOUNTER — Other Ambulatory Visit (HOSPITAL_COMMUNITY): Payer: Self-pay

## 2023-08-07 ENCOUNTER — Other Ambulatory Visit (HOSPITAL_COMMUNITY): Payer: Self-pay

## 2023-08-07 ENCOUNTER — Other Ambulatory Visit: Payer: Self-pay

## 2023-08-17 DIAGNOSIS — E66813 Obesity, class 3: Secondary | ICD-10-CM | POA: Diagnosis not present

## 2023-08-17 DIAGNOSIS — R7303 Prediabetes: Secondary | ICD-10-CM | POA: Diagnosis not present

## 2023-08-17 DIAGNOSIS — Z636 Dependent relative needing care at home: Secondary | ICD-10-CM | POA: Diagnosis not present

## 2023-08-17 DIAGNOSIS — Z6841 Body Mass Index (BMI) 40.0 and over, adult: Secondary | ICD-10-CM | POA: Diagnosis not present

## 2023-08-17 DIAGNOSIS — R632 Polyphagia: Secondary | ICD-10-CM | POA: Diagnosis not present

## 2023-08-17 DIAGNOSIS — I10 Essential (primary) hypertension: Secondary | ICD-10-CM | POA: Diagnosis not present

## 2023-08-20 ENCOUNTER — Other Ambulatory Visit: Payer: Self-pay | Admitting: Family Medicine

## 2023-08-20 DIAGNOSIS — K760 Fatty (change of) liver, not elsewhere classified: Secondary | ICD-10-CM

## 2023-08-21 ENCOUNTER — Other Ambulatory Visit (HOSPITAL_COMMUNITY): Payer: Self-pay

## 2023-08-22 ENCOUNTER — Ambulatory Visit
Admission: RE | Admit: 2023-08-22 | Discharge: 2023-08-22 | Disposition: A | Discharge status: Home / Self Care | Source: Ambulatory Visit | Attending: Family Medicine | Admitting: Family Medicine

## 2023-08-22 DIAGNOSIS — K760 Fatty (change of) liver, not elsewhere classified: Secondary | ICD-10-CM | POA: Diagnosis not present

## 2023-08-22 DIAGNOSIS — N200 Calculus of kidney: Secondary | ICD-10-CM | POA: Diagnosis not present

## 2023-08-25 ENCOUNTER — Other Ambulatory Visit (HOSPITAL_COMMUNITY): Payer: Self-pay

## 2023-08-28 ENCOUNTER — Ambulatory Visit: Admitting: Podiatry

## 2023-08-28 ENCOUNTER — Encounter: Payer: Self-pay | Admitting: Podiatry

## 2023-08-28 DIAGNOSIS — L603 Nail dystrophy: Secondary | ICD-10-CM

## 2023-08-28 DIAGNOSIS — M6740 Ganglion, unspecified site: Secondary | ICD-10-CM

## 2023-08-28 NOTE — Progress Notes (Signed)
  Subjective:  Patient ID: [facility], female    DOB: 31-Aug-1960,   MRN: [id number]  Chief Complaint  Patient presents with   Nail Problem    Patient states that she has been having toenail pain in bilateral hallux, patient is not taking medication for pain. Patient states the pain is her bilateral hallux nails. It has been about 6 months    63 y.o. female presents for concern as above. Relates also a cyst on the top of her left great toe that has been painful as well. She does relate bumping her toes a lot getting around in her wheelchair.  . Denies any other pedal complaints. Denies n/v/f/c.   Past Medical History:  Diagnosis Date   Abnormal nuclear cardiac imaging test 03/23/2017   Arthritis    Bilateral primary osteoarthritis of knee 06/05/2016   Cellulitis and abscess of trunk 04/30/2012   Chest pain 02/13/2017   Chronic pain of left knee 10/31/2011   Significant medial compartment DJD bilaterally. Both knees had steroid injection done on October 31, 2011   Chronic pain syndrome 09/28/2016   Complication of anesthesia 1995   woke up on table right after gallbladder surgery   Constipation    Corneal ulcer 08/06/2012   Edema, lower extremity    Essential hypertension 02/13/2017   Family history of adverse reaction to anesthesia    sister coded twice with anesthesai not sure of details sister still living   Family history of breast cancer    Family history of uterine cancer    Gallbladder problem    Genetic testing 09/07/2016   Negative genetic testing on the Common Hereditary cancer panel.  The Hereditary Gene Panel offered by [facility] includes sequencing and/or deletion duplication testing of the following 46 genes: APC, ATM, AXIN2, BARD1, BMPR1A, BRCA1, BRCA2, BRIP1, CDH1, CDKN2A (p14ARF), CDKN2A (p16INK4a), CHEK2, CTNNA1, DICER1, EPCAM (Deletion/duplication testing only), GREM1 (promoter region deletion/duplication te   Headache    migraines   Joint pain    Knee  pain    Leg pain    Migraine    Mixed dyslipidemia 02/13/2017   Morbid obesity (HCC) 02/13/2017   Osteoarthritis    Sleep apnea    SOBOE (shortness of breath on exertion)     Objective:  Physical Exam: Vascular: DP/PT pulses 2/4 bilateral. CFT <3 seconds. Normal hair growth on digits. No edema.  Skin. No lacerations or abrasions bilateral feet. Soft tissue mass about 0.3 cm over dorsum of left hallux IPJ with fluctuance and tenderness to palpation. Bilateral hallux nails tender to palpation with some thickening and subungual debris noted.  Musculoskeletal: MMT 5/5 bilateral lower extremities in DF, PF, Inversion and Eversion. Deceased ROM in DF of ankle joint.  Neurological: Sensation intact to light touch.   Assessment:   1. Onychodystrophy   2. Ganglion cyst      Plan:  Patient was evaluated and treated and all questions answered. Discussed  dystrophic toenails etiology and treatment options including procedure for removal vs conservative care.  Debrided hallux nails bilateral to paitent improvement and will see how this helps. Did discuss if continues to be painful would discuss removal of the nails. . Discussed ganglion cysts and treatment options with the patient. Caps provided for toes and will see if this helps with cyst if continues would suggest aspiration.  Patient to follow-up as needed    Jennefer Moats, DPM

## 2023-09-03 ENCOUNTER — Other Ambulatory Visit: Payer: Self-pay

## 2023-09-03 ENCOUNTER — Other Ambulatory Visit (HOSPITAL_COMMUNITY): Payer: Self-pay

## 2023-09-03 MED ORDER — TROSPIUM CHLORIDE ER 60 MG PO CP24
60.0000 mg | ORAL_CAPSULE | Freq: Every day | ORAL | 3 refills | Status: DC
  Filled 2023-09-03: qty 30, 30d supply, fill #0
  Filled 2024-01-02: qty 30, 30d supply, fill #1
  Filled 2024-01-25: qty 30, 30d supply, fill #2
  Filled 2024-02-25 – 2024-03-02 (×2): qty 30, 30d supply, fill #3

## 2023-09-04 ENCOUNTER — Telehealth: Payer: Self-pay | Admitting: Registered Nurse

## 2023-09-04 ENCOUNTER — Other Ambulatory Visit (HOSPITAL_COMMUNITY): Payer: Self-pay

## 2023-09-04 ENCOUNTER — Other Ambulatory Visit: Payer: Self-pay

## 2023-09-04 DIAGNOSIS — M170 Bilateral primary osteoarthritis of knee: Secondary | ICD-10-CM

## 2023-09-04 DIAGNOSIS — G894 Chronic pain syndrome: Secondary | ICD-10-CM

## 2023-09-04 MED ORDER — OXYCODONE HCL 10 MG PO TABS
10.0000 mg | ORAL_TABLET | Freq: Four times a day (QID) | ORAL | 0 refills | Status: DC | PRN
  Filled 2023-09-04: qty 120, 30d supply, fill #0

## 2023-09-04 NOTE — Telephone Encounter (Signed)
 PMP was Reviewed Oxycodone  e-scribed to pharmacy.  [name] is aware of the above via My-Chart

## 2023-09-05 ENCOUNTER — Other Ambulatory Visit (HOSPITAL_COMMUNITY): Payer: Self-pay

## 2023-09-05 ENCOUNTER — Other Ambulatory Visit: Payer: Self-pay

## 2023-09-05 DIAGNOSIS — R351 Nocturia: Secondary | ICD-10-CM | POA: Diagnosis not present

## 2023-09-05 DIAGNOSIS — N200 Calculus of kidney: Secondary | ICD-10-CM | POA: Diagnosis not present

## 2023-09-05 DIAGNOSIS — R8271 Bacteriuria: Secondary | ICD-10-CM | POA: Diagnosis not present

## 2023-09-05 MED ORDER — TROSPIUM CHLORIDE ER 60 MG PO CP24
60.0000 mg | ORAL_CAPSULE | Freq: Every day | ORAL | 3 refills | Status: DC
  Filled 2023-09-25 – 2023-10-07 (×3): qty 90, 90d supply, fill #0

## 2023-09-06 ENCOUNTER — Other Ambulatory Visit (HOSPITAL_COMMUNITY): Payer: Self-pay

## 2023-09-10 ENCOUNTER — Other Ambulatory Visit (HOSPITAL_COMMUNITY): Payer: Self-pay

## 2023-09-10 DIAGNOSIS — N200 Calculus of kidney: Secondary | ICD-10-CM | POA: Diagnosis not present

## 2023-09-13 ENCOUNTER — Other Ambulatory Visit (HOSPITAL_COMMUNITY): Payer: Self-pay

## 2023-09-17 ENCOUNTER — Other Ambulatory Visit (HOSPITAL_COMMUNITY): Payer: Self-pay

## 2023-09-17 ENCOUNTER — Other Ambulatory Visit: Payer: Self-pay

## 2023-09-17 MED ORDER — PHENTERMINE HCL 37.5 MG PO TABS
ORAL_TABLET | ORAL | 0 refills | Status: DC
  Filled 2023-09-17: qty 45, 30d supply, fill #0

## 2023-09-17 MED ORDER — ZOLPIDEM TARTRATE 10 MG PO TABS
10.0000 mg | ORAL_TABLET | Freq: Every day | ORAL | 3 refills | Status: AC
  Filled 2023-09-17: qty 30, 30d supply, fill #0
  Filled 2023-12-18: qty 30, 30d supply, fill #1
  Filled 2024-01-02 – 2024-01-22 (×2): qty 30, 30d supply, fill #2
  Filled 2024-02-25 – 2024-03-02 (×2): qty 30, 30d supply, fill #3

## 2023-09-24 ENCOUNTER — Other Ambulatory Visit: Payer: Self-pay

## 2023-09-24 ENCOUNTER — Other Ambulatory Visit (HOSPITAL_COMMUNITY): Payer: Self-pay

## 2023-09-24 DIAGNOSIS — Z0000 Encounter for general adult medical examination without abnormal findings: Secondary | ICD-10-CM | POA: Diagnosis not present

## 2023-09-24 DIAGNOSIS — G4733 Obstructive sleep apnea (adult) (pediatric): Secondary | ICD-10-CM | POA: Diagnosis not present

## 2023-09-24 DIAGNOSIS — M179 Osteoarthritis of knee, unspecified: Secondary | ICD-10-CM | POA: Diagnosis not present

## 2023-09-24 DIAGNOSIS — E785 Hyperlipidemia, unspecified: Secondary | ICD-10-CM | POA: Diagnosis not present

## 2023-09-24 DIAGNOSIS — Z6841 Body Mass Index (BMI) 40.0 and over, adult: Secondary | ICD-10-CM | POA: Diagnosis not present

## 2023-09-24 DIAGNOSIS — I10 Essential (primary) hypertension: Secondary | ICD-10-CM | POA: Diagnosis not present

## 2023-09-24 DIAGNOSIS — G4700 Insomnia, unspecified: Secondary | ICD-10-CM | POA: Diagnosis not present

## 2023-09-24 MED ORDER — VALSARTAN 320 MG PO TABS
160.0000 mg | ORAL_TABLET | Freq: Every day | ORAL | 3 refills | Status: AC
  Filled 2023-11-28: qty 45, 90d supply, fill #0
  Filled 2024-01-25 – 2024-02-19 (×2): qty 45, 90d supply, fill #1

## 2023-09-24 MED ORDER — NORTRIPTYLINE HCL 50 MG PO CAPS
100.0000 mg | ORAL_CAPSULE | Freq: Every day | ORAL | 3 refills | Status: AC
  Filled 2023-09-25 – 2023-09-26 (×2): qty 180, 90d supply, fill #0
  Filled 2023-09-26 – 2023-10-03 (×2): qty 60, 30d supply, fill #0
  Filled 2023-10-03: qty 30, 15d supply, fill #0
  Filled 2023-10-14 – 2023-11-06 (×2): qty 30, 15d supply, fill #1
  Filled 2023-11-08: qty 60, 30d supply, fill #1
  Filled 2023-11-08 (×2): qty 30, 15d supply, fill #1
  Filled 2023-11-09: qty 60, 30d supply, fill #2
  Filled 2023-12-18: qty 60, 30d supply, fill #3
  Filled 2024-01-25: qty 60, 30d supply, fill #4
  Filled 2024-02-25: qty 60, 30d supply, fill #5
  Filled 2024-03-28: qty 60, 30d supply, fill #6
  Filled 2024-04-11 – 2024-04-21 (×2): qty 60, 30d supply, fill #7

## 2023-09-24 MED ORDER — ZOLPIDEM TARTRATE 10 MG PO TABS
10.0000 mg | ORAL_TABLET | Freq: Every evening | ORAL | 5 refills | Status: DC | PRN
  Filled 2023-10-16: qty 30, 30d supply, fill #0
  Filled 2023-11-14: qty 30, 30d supply, fill #1
  Filled 2023-11-15: qty 30, 30d supply, fill #2

## 2023-09-24 MED ORDER — ATORVASTATIN CALCIUM 40 MG PO TABS
40.0000 mg | ORAL_TABLET | Freq: Every day | ORAL | 3 refills | Status: AC
  Filled 2023-09-24: qty 90, 90d supply, fill #0
  Filled 2024-01-25: qty 90, 90d supply, fill #1
  Filled 2024-02-25 – 2024-04-22 (×2): qty 90, 90d supply, fill #2

## 2023-09-24 MED ORDER — CELECOXIB 200 MG PO CAPS
ORAL_CAPSULE | ORAL | 3 refills | Status: DC
  Filled 2023-09-25: qty 90, 90d supply, fill #0

## 2023-09-24 MED ORDER — FUROSEMIDE 20 MG PO TABS
20.0000 mg | ORAL_TABLET | Freq: Every day | ORAL | 1 refills | Status: DC | PRN
  Filled 2023-09-24: qty 90, 90d supply, fill #0
  Filled 2024-01-25: qty 90, 90d supply, fill #1

## 2023-09-24 MED ORDER — ALBUTEROL SULFATE HFA 108 (90 BASE) MCG/ACT IN AERS
1.0000 | INHALATION_SPRAY | RESPIRATORY_TRACT | 1 refills | Status: DC | PRN
  Filled 2023-09-24: qty 6.7, 33d supply, fill #0

## 2023-09-24 MED ORDER — HYDROXYZINE HCL 25 MG PO TABS
25.0000 mg | ORAL_TABLET | Freq: Three times a day (TID) | ORAL | 3 refills | Status: DC | PRN
  Filled 2023-09-24: qty 90, 30d supply, fill #0

## 2023-09-25 ENCOUNTER — Other Ambulatory Visit (HOSPITAL_COMMUNITY): Payer: Self-pay

## 2023-09-25 ENCOUNTER — Other Ambulatory Visit: Payer: Self-pay

## 2023-09-26 ENCOUNTER — Other Ambulatory Visit (HOSPITAL_COMMUNITY): Payer: Self-pay

## 2023-09-26 ENCOUNTER — Other Ambulatory Visit: Payer: Self-pay

## 2023-09-27 ENCOUNTER — Other Ambulatory Visit: Payer: Self-pay

## 2023-10-02 ENCOUNTER — Encounter: Attending: Registered Nurse | Admitting: Registered Nurse

## 2023-10-02 ENCOUNTER — Other Ambulatory Visit: Payer: Self-pay

## 2023-10-02 ENCOUNTER — Other Ambulatory Visit (HOSPITAL_COMMUNITY): Payer: Self-pay

## 2023-10-02 VITALS — BP 117/72 | HR 79 | Ht 61.0 in | Wt 252.0 lb

## 2023-10-02 DIAGNOSIS — M7061 Trochanteric bursitis, right hip: Secondary | ICD-10-CM | POA: Insufficient documentation

## 2023-10-02 DIAGNOSIS — Z79891 Long term (current) use of opiate analgesic: Secondary | ICD-10-CM | POA: Diagnosis not present

## 2023-10-02 DIAGNOSIS — N200 Calculus of kidney: Secondary | ICD-10-CM | POA: Diagnosis not present

## 2023-10-02 DIAGNOSIS — G894 Chronic pain syndrome: Secondary | ICD-10-CM | POA: Insufficient documentation

## 2023-10-02 DIAGNOSIS — Z5181 Encounter for therapeutic drug level monitoring: Secondary | ICD-10-CM | POA: Insufficient documentation

## 2023-10-02 DIAGNOSIS — M7062 Trochanteric bursitis, left hip: Secondary | ICD-10-CM | POA: Insufficient documentation

## 2023-10-02 DIAGNOSIS — M170 Bilateral primary osteoarthritis of knee: Secondary | ICD-10-CM | POA: Diagnosis not present

## 2023-10-02 MED ORDER — OXYCODONE HCL 10 MG PO TABS
10.0000 mg | ORAL_TABLET | Freq: Four times a day (QID) | ORAL | 0 refills | Status: DC | PRN
  Filled 2023-10-02 – 2023-10-03 (×2): qty 120, 30d supply, fill #0

## 2023-10-02 NOTE — Progress Notes (Unsigned)
 Subjective:    Patient ID: Anita Booker, female    DOB: 1960-05-14, 63 y.o.   MRN: [id number]  HPI: Anita Booker is a 63 y.o. female who returns for follow up appointment for chronic pain and medication refill. states *** pain is located in  ***. rates pain ***. current exercise regime is walking and performing stretching exercises.   Pain Inventory Average Pain 6 Pain Right Now 7 My pain is aching  In the last 24 hours, has pain interfered with the following? General activity 6 Relation with others 5 Enjoyment of life 5 What TIME of day is your pain at its worst? night Sleep (in general) Poor  Pain is worse with: walking and standing Pain improves with: rest, heat/ice, therapy/exercise, and medication Relief from Meds: 4  Family History  Problem Relation Age of Onset   Cancer Mother    Thyroid  disease Mother    Stroke Mother    Hypertension Mother    Diabetes Mother    Uterine cancer Mother 29   Obesity Mother    Obesity Father    Hypertension Father    Lung cancer Father 50   Cancer Father        lung   Breast cancer Sister 79       identical twin   Healthy Sister    Healthy Brother    Heart attack Brother 48   Cancer Paternal Grandmother        breast   Lung cancer Paternal Uncle        all four uncles had lung cancer and were smokers   Breast cancer Cousin        paternal cousin dx in her 24s   Liver cancer Cousin        paternal cousin with hepatitis and liver cancer   Breast cancer Other    Social History   Socioeconomic History   Marital status: Married    Spouse name: Anita Booker   Number of children: 5   Years of education: Not on file   Highest education level: Not on file  Occupational History   Occupation: RN  Tobacco Use   Smoking status: Never   Smokeless tobacco: Never  Vaping Use   Vaping status: Never Used  Substance and Sexual Activity   Alcohol use: No   Drug use: No   Sexual activity: Not on file   Other Topics Concern   Not on file  Social History Narrative   Right handed   Social Drivers of Health   Financial Resource Strain: Patient Declined (10/22/2022)   Received from [facility]   Overall Financial Resource Strain (CARDIA)    Difficulty of Paying Living Expenses: Patient declined  Food Insecurity: Low Risk  (07/03/2023)   Received from [facility]   Hunger Vital Sign    Within the past 12 months, you worried that your food would run out before you got money to buy more: Never true    Within the past 12 months, the food you bought just didn't last and you didn't have money to get more. : Never true  Transportation Needs: No Transportation Needs (07/03/2023)   Received from [facility]    In the past 12 months, has lack of reliable transportation kept you from medical appointments, meetings, work or from getting things needed for daily living? : No  Physical Activity: Unknown (10/22/2022)   Received from [facility]   Exercise Vital Sign  On average, how many days per week do you engage in moderate to strenuous exercise (like a brisk walk)?: Patient declined    On average, how many minutes do you engage in exercise at this level?: 10 min  Stress: Patient Declined (10/22/2022)   Received from [facility] of Occupational Health - Occupational Stress Questionnaire    Feeling of Stress : Patient declined  Social Connections: Moderately Integrated (10/22/2022)   Received from [facility]   Social Network    How would you rate your social network (family, work, friends)?: Adequate participation with social networks   Past Surgical History:  Procedure Laterality Date   BREAST EXCISIONAL BIOPSY Right    infection to right breast 5 years ago.   BREAST SURGERY  07/01/2011.   I & D of right breast abscess   CHOLECYSTECTOMY  1995   COLONOSCOPY WITH PROPOFOL  N/A 07/05/2016   Procedure: COLONOSCOPY WITH PROPOFOL ;  Surgeon: Elsie Cree,  MD;  Location: [facility];  Service: Endoscopy;  Laterality: N/A;   LEFT HEART CATH AND CORONARY ANGIOGRAPHY N/A 03/26/2017   Procedure: LEFT HEART CATH AND CORONARY ANGIOGRAPHY;  Surgeon: Mady Bruckner, MD;  [facility];  Service: Cardiovascular;  Laterality: N/A;   Past Surgical History:  Procedure Laterality Date   BREAST EXCISIONAL BIOPSY Right    infection to right breast 5 years ago.   BREAST SURGERY  07/01/2011.   I & D of right breast abscess   CHOLECYSTECTOMY  1995   COLONOSCOPY WITH PROPOFOL  N/A 07/05/2016   Procedure: COLONOSCOPY WITH PROPOFOL ;  Surgeon: Elsie Cree, MD;  [facility];  Service: Endoscopy;  Laterality: N/A;   LEFT HEART CATH AND CORONARY ANGIOGRAPHY N/A 03/26/2017   Procedure: LEFT HEART CATH AND CORONARY ANGIOGRAPHY;  Surgeon: Mady Bruckner, MD;  [facility];  Service: Cardiovascular;  Laterality: N/A;   Past Medical History:  Diagnosis Date   Abnormal nuclear cardiac imaging test 03/23/2017   Arthritis    Bilateral primary osteoarthritis of knee 06/05/2016   Cellulitis and abscess of trunk 04/30/2012   Chest pain 02/13/2017   Chronic pain of left knee 10/31/2011   Significant medial compartment DJD bilaterally. Both knees had steroid injection done on October 31, 2011   Chronic pain syndrome 09/28/2016   Complication of anesthesia 1995   woke up on table right after gallbladder surgery   Constipation    Corneal ulcer 08/06/2012   Edema, lower extremity    Essential hypertension 02/13/2017   Family history of adverse reaction to anesthesia    sister coded twice with anesthesai not sure of details sister still living   Family history of breast cancer    Family history of uterine cancer    Gallbladder problem    Genetic testing 09/07/2016   Negative genetic testing on the Common Hereditary cancer panel.  The Hereditary Gene Panel offered by [facility] includes sequencing and/or deletion duplication testing  of the following 46 genes: APC, ATM, AXIN2, BARD1, BMPR1A, BRCA1, BRCA2, BRIP1, CDH1, CDKN2A (p14ARF), CDKN2A (p16INK4a), CHEK2, CTNNA1, DICER1, EPCAM (Deletion/duplication testing only), GREM1 (promoter region deletion/duplication te   Headache    migraines   Joint pain    Knee pain    Leg pain    Migraine    Mixed dyslipidemia 02/13/2017   Morbid obesity (HCC) 02/13/2017   Osteoarthritis    Sleep apnea    SOBOE (shortness of breath on exertion)    BP 117/72 (BP Location: Right Arm,  Patient Position: Sitting, Cuff Size: Large)   Pulse 79   Ht 5' 1 (1.549 m)   Wt 252 lb (114.3 kg)   SpO2 94%   BMI 47.61 kg/m   Opioid Risk Score:   Fall Risk Score:  `1  Depression screen [facility] 2/9     07/26/2023   11:20 AM 04/17/2023   11:38 AM 07/11/2022   10:56 AM 10/04/2021   11:00 AM 07/04/2021   11:27 AM 04/12/2021   12:01 PM 04/05/2021    8:43 AM  Depression screen PHQ 2/9  Decreased Interest 0 0 0 0 0 0 3  Down, Depressed, Hopeless 0 0 0 0 0 0 1  PHQ - 2 Score 0 0 0 0 0 0 4  Altered sleeping       3  Tired, decreased energy       3  Change in appetite       1  Feeling bad or failure about yourself        1  Trouble concentrating       0  Moving slowly or fidgety/restless       0  Suicidal thoughts       0  PHQ-9 Score       12  Difficult doing work/chores       Very difficult      Review of Systems  Musculoskeletal:        B/L hip, knee pain  All other systems reviewed and are negative.      Objective:   Physical Exam        Assessment & Plan:

## 2023-10-03 ENCOUNTER — Other Ambulatory Visit: Payer: Self-pay

## 2023-10-03 ENCOUNTER — Encounter: Payer: Self-pay | Admitting: Registered Nurse

## 2023-10-03 ENCOUNTER — Other Ambulatory Visit (HOSPITAL_COMMUNITY): Payer: Self-pay

## 2023-10-08 ENCOUNTER — Other Ambulatory Visit (HOSPITAL_COMMUNITY): Payer: Self-pay

## 2023-10-08 ENCOUNTER — Other Ambulatory Visit: Payer: Self-pay

## 2023-10-15 ENCOUNTER — Other Ambulatory Visit: Payer: Self-pay

## 2023-10-17 ENCOUNTER — Other Ambulatory Visit: Payer: Self-pay

## 2023-10-22 DIAGNOSIS — K760 Fatty (change of) liver, not elsewhere classified: Secondary | ICD-10-CM | POA: Diagnosis not present

## 2023-10-22 DIAGNOSIS — I2510 Atherosclerotic heart disease of native coronary artery without angina pectoris: Secondary | ICD-10-CM | POA: Diagnosis not present

## 2023-10-22 DIAGNOSIS — E65 Localized adiposity: Secondary | ICD-10-CM | POA: Diagnosis not present

## 2023-10-22 DIAGNOSIS — E66813 Obesity, class 3: Secondary | ICD-10-CM | POA: Diagnosis not present

## 2023-10-22 DIAGNOSIS — R632 Polyphagia: Secondary | ICD-10-CM | POA: Diagnosis not present

## 2023-10-22 DIAGNOSIS — Z6841 Body Mass Index (BMI) 40.0 and over, adult: Secondary | ICD-10-CM | POA: Diagnosis not present

## 2023-10-25 DIAGNOSIS — N200 Calculus of kidney: Secondary | ICD-10-CM | POA: Diagnosis not present

## 2023-10-26 ENCOUNTER — Other Ambulatory Visit (HOSPITAL_COMMUNITY): Payer: Self-pay

## 2023-10-26 MED ORDER — PHENTERMINE HCL 37.5 MG PO TABS
ORAL_TABLET | ORAL | 0 refills | Status: DC
  Filled 2023-10-26: qty 135, 90d supply, fill #0

## 2023-10-29 ENCOUNTER — Telehealth: Payer: Self-pay | Admitting: Cardiology

## 2023-10-29 NOTE — Telephone Encounter (Signed)
Patient would like to switch providers. Please advise

## 2023-10-31 ENCOUNTER — Other Ambulatory Visit: Payer: Self-pay | Admitting: Urology

## 2023-11-02 ENCOUNTER — Other Ambulatory Visit: Payer: Self-pay | Admitting: Urology

## 2023-11-06 ENCOUNTER — Other Ambulatory Visit: Payer: Self-pay | Admitting: Registered Nurse

## 2023-11-06 ENCOUNTER — Other Ambulatory Visit (HOSPITAL_COMMUNITY): Payer: Self-pay

## 2023-11-07 ENCOUNTER — Other Ambulatory Visit: Payer: Self-pay

## 2023-11-08 ENCOUNTER — Other Ambulatory Visit (HOSPITAL_COMMUNITY): Payer: Self-pay

## 2023-11-08 ENCOUNTER — Other Ambulatory Visit: Payer: Self-pay

## 2023-11-08 DIAGNOSIS — M170 Bilateral primary osteoarthritis of knee: Secondary | ICD-10-CM

## 2023-11-08 DIAGNOSIS — G894 Chronic pain syndrome: Secondary | ICD-10-CM

## 2023-11-08 DIAGNOSIS — L237 Allergic contact dermatitis due to plants, except food: Secondary | ICD-10-CM | POA: Diagnosis not present

## 2023-11-08 MED ORDER — OXYCODONE HCL 10 MG PO TABS: 10.0000 mg | ORAL_TABLET | Freq: Four times a day (QID) | ORAL | 0 refills | Status: DC | PRN

## 2023-11-08 MED ORDER — OXYCODONE HCL 10 MG PO TABS
10.0000 mg | ORAL_TABLET | Freq: Four times a day (QID) | ORAL | 0 refills | Status: DC | PRN
  Filled 2023-11-08: qty 120, 20d supply, fill #0

## 2023-11-08 NOTE — Addendum Note (Signed)
 Addended by: EMELINE SEARCH on: 11/08/2023 12:40 PM   Modules accepted: Orders

## 2023-11-09 ENCOUNTER — Other Ambulatory Visit: Payer: Self-pay

## 2023-11-09 ENCOUNTER — Other Ambulatory Visit (HOSPITAL_COMMUNITY): Payer: Self-pay

## 2023-11-09 DIAGNOSIS — G4700 Insomnia, unspecified: Secondary | ICD-10-CM | POA: Diagnosis not present

## 2023-11-09 DIAGNOSIS — Z01818 Encounter for other preprocedural examination: Secondary | ICD-10-CM | POA: Diagnosis not present

## 2023-11-09 DIAGNOSIS — I10 Essential (primary) hypertension: Secondary | ICD-10-CM | POA: Diagnosis not present

## 2023-11-09 DIAGNOSIS — Z6841 Body Mass Index (BMI) 40.0 and over, adult: Secondary | ICD-10-CM | POA: Diagnosis not present

## 2023-11-12 ENCOUNTER — Other Ambulatory Visit (HOSPITAL_COMMUNITY): Payer: Self-pay

## 2023-11-13 ENCOUNTER — Other Ambulatory Visit: Payer: Self-pay

## 2023-11-14 ENCOUNTER — Other Ambulatory Visit: Payer: Self-pay

## 2023-11-15 ENCOUNTER — Other Ambulatory Visit (HOSPITAL_COMMUNITY): Payer: Self-pay

## 2023-11-23 ENCOUNTER — Other Ambulatory Visit (HOSPITAL_COMMUNITY): Payer: Self-pay

## 2023-11-26 ENCOUNTER — Other Ambulatory Visit (HOSPITAL_COMMUNITY): Payer: Self-pay

## 2023-11-27 ENCOUNTER — Other Ambulatory Visit (HOSPITAL_COMMUNITY): Payer: Self-pay

## 2023-11-28 ENCOUNTER — Other Ambulatory Visit (HOSPITAL_COMMUNITY): Payer: Self-pay

## 2023-11-28 ENCOUNTER — Other Ambulatory Visit (HOSPITAL_BASED_OUTPATIENT_CLINIC_OR_DEPARTMENT_OTHER): Payer: Self-pay

## 2023-12-06 ENCOUNTER — Encounter: Attending: Registered Nurse | Admitting: Physical Medicine & Rehabilitation

## 2023-12-06 ENCOUNTER — Encounter: Payer: Self-pay | Admitting: Physical Medicine & Rehabilitation

## 2023-12-06 ENCOUNTER — Other Ambulatory Visit: Payer: Self-pay

## 2023-12-06 DIAGNOSIS — M170 Bilateral primary osteoarthritis of knee: Secondary | ICD-10-CM | POA: Diagnosis not present

## 2023-12-06 DIAGNOSIS — G894 Chronic pain syndrome: Secondary | ICD-10-CM | POA: Insufficient documentation

## 2023-12-06 MED ORDER — OXYCODONE HCL 10 MG PO TABS
10.0000 mg | ORAL_TABLET | Freq: Four times a day (QID) | ORAL | 0 refills | Status: AC | PRN
  Filled 2023-12-06: qty 120, 30d supply, fill #0

## 2023-12-06 NOTE — Progress Notes (Signed)
 Subjective:    Patient ID: Anita Booker, female    DOB: [birth year], 63 y.o.   MRN: [id number]  HPI 63 year old female seen in our extender clinic for chronic severe bilateral knee pain.  She has a history of morbid obesity and has not been a surgical candidate for knee replacement.  She has been on Belbuca  450 mcg every 12 hours until this summer when financial concerns made this unavailable to the patient.  The patient is now on oxycodone  10 mg 4 times daily.  She has had no side effects from this medication. Functionally she is modified independent with all self-care.  She is mainly in a scooter but can ambulate short distances in the home to get to the toilet.  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 The patient has lost over 100 pounds on GLP-1 agonist plus diet although her BMI is still 47 She is scheduled for nephrostomy tube placement by Dr. Cam from urology Pain Inventory Average Pain 6 Pain Right Now 5 My pain is constant, sharp, burning, dull, stabbing, and aching  In the last 24 hours, has pain interfered with the following? General activity 5 Relation with others 5 Enjoyment of life 6 What TIME of day is your pain at its worst? evening and night Sleep (in general) Poor  Pain is worse with: walking and standing Pain improves with: rest, heat/ice, and medication Relief from Meds: 3  Family History  Problem Relation Age of Onset   Cancer Mother    Thyroid  disease Mother    Stroke Mother    Hypertension Mother    Diabetes Mother    Uterine cancer Mother 67   Obesity Mother    Obesity Father    Hypertension Father    Lung cancer Father 59   Cancer Father        lung   Breast cancer Sister 62       identical twin   Healthy Sister    Healthy Brother    Heart attack Brother 48   Cancer Paternal Grandmother        breast   Lung cancer Paternal Uncle        all four uncles had lung cancer and were smokers   Breast cancer Cousin         paternal cousin dx in her 72s   Liver cancer Cousin        paternal cousin with hepatitis and liver cancer   Breast cancer Other    Social History   Socioeconomic History   Marital status: Married    Spouse name: Lynwood Booker   Number of children: 5   Years of education: Not on file   Highest education level: Not on file  Occupational History   Occupation: RN  Tobacco Use   Smoking status: Never   Smokeless tobacco: Never  Vaping Use   Vaping status: Never Used  Substance and Sexual Activity   Alcohol use: No   Drug use: No   Sexual activity: Not on file  Other Topics Concern   Not on file  Social History Narrative   Right handed   Social Drivers of Health   Financial Resource Strain: Patient Declined (10/22/2022)   [facility]   Overall Financial Resource Strain (CARDIA)    Difficulty of Paying Living Expenses: Patient declined  Food Insecurity: Low Risk  (07/03/2023)   Received from [facility]   Hunger Vital Sign    Within the past 12  months, you worried that your food would run out before you got money to buy more: Never true    Within the past 12 months, the food you bought just didn't last and you didn't have money to get more. : Never true  Transportation Needs: No Transportation Needs (07/03/2023)   Received from [facility]    In the past 12 months, has lack of reliable transportation kept you from medical appointments, meetings, work or from getting things needed for daily living? : No  Physical Activity: Unknown (10/22/2022)   Received from [facility]   Exercise Vital Sign    On average, how many days per week do you engage in moderate to strenuous exercise (like a brisk walk)?: Patient declined    On average, how many minutes do you engage in exercise at this level?: 10 min  Stress: Patient Declined (10/22/2022)   Received from [facility] of Occupational Health - Occupational Stress Questionnaire     Feeling of Stress : Patient declined  Social Connections: Moderately Integrated (10/22/2022)   Received [facility]   Social Network    How would you rate your social network (family, work, friends)?: Adequate participation with social networks   Past Surgical History:  Procedure Laterality Date   BREAST EXCISIONAL BIOPSY Right    infection to right breast 5 years ago.   BREAST SURGERY  07/01/2011.   I & D of right breast abscess   CHOLECYSTECTOMY  1995   COLONOSCOPY WITH PROPOFOL  N/A 07/05/2016   Procedure: COLONOSCOPY WITH PROPOFOL ;  Surgeon: Elsie Cree, MD;  [facility];  Service: Endoscopy;  Laterality: N/A;   LEFT HEART CATH AND CORONARY ANGIOGRAPHY N/A 03/26/2017   Procedure: LEFT HEART CATH AND CORONARY ANGIOGRAPHY;  Surgeon: Mady Bruckner, MD;  [facility];  Service: Cardiovascular;  Laterality: N/A;   Past Surgical History:  Procedure Laterality Date   BREAST EXCISIONAL BIOPSY Right    infection to right breast 5 years ago.   BREAST SURGERY  07/01/2011.   I & D of right breast abscess   CHOLECYSTECTOMY  1995   COLONOSCOPY WITH PROPOFOL  N/A 07/05/2016   Procedure: COLONOSCOPY WITH PROPOFOL ;  Surgeon: Elsie Cree, MD;  [facility];  Service: Endoscopy;  Laterality: N/A;   LEFT HEART CATH AND CORONARY ANGIOGRAPHY N/A 03/26/2017   Procedure: LEFT HEART CATH AND CORONARY ANGIOGRAPHY;  Surgeon: Mady Bruckner, MD;  [facility];  Service: Cardiovascular;  Laterality: N/A;   Past Medical History:  Diagnosis Date   Abnormal nuclear cardiac imaging test 03/23/2017   Arthritis    Bilateral primary osteoarthritis of knee 06/05/2016   Cellulitis and abscess of trunk 04/30/2012   Chest pain 02/13/2017   Chronic pain of left knee 10/31/2011   Significant medial compartment DJD bilaterally. Both knees had steroid injection done on October 31, 2011   Chronic pain syndrome 09/28/2016   Complication of anesthesia 1995    woke up on table right after gallbladder surgery   Constipation    Corneal ulcer 08/06/2012   Edema, lower extremity    Essential hypertension 02/13/2017   Family history of adverse reaction to anesthesia    sister coded twice with anesthesai not sure of details sister still living   Family history of breast cancer    Family history of uterine cancer    Gallbladder problem    Genetic testing 09/07/2016   Negative genetic testing on the Common Hereditary  cancer panel.  The Hereditary Gene Panel offered by [facility] includes sequencing and/or deletion duplication testing of the following 46 genes: APC, ATM, AXIN2, BARD1, BMPR1A, BRCA1, BRCA2, BRIP1, CDH1, CDKN2A (p14ARF), CDKN2A (p16INK4a), CHEK2, CTNNA1, DICER1, EPCAM (Deletion/duplication testing only), GREM1 (promoter region deletion/duplication te   Headache    migraines   Joint pain    Knee pain    Leg pain    Migraine    Mixed dyslipidemia 02/13/2017   Morbid obesity (HCC) 02/13/2017   Osteoarthritis    Sleep apnea    SOBOE (shortness of breath on exertion)    Ht 5' 1 (1.549 m)   Wt 249 lb (112.9 kg)   BMI 47.05 kg/m   Opioid Risk Score:   Fall Risk Score:  `1  Depression screen [facility] 2/9     12/06/2023    9:55 AM 07/26/2023   11:20 AM 04/17/2023   11:38 AM 07/11/2022   10:56 AM 10/04/2021   11:00 AM 07/04/2021   11:27 AM 04/12/2021   12:01 PM  Depression screen PHQ 2/9  Decreased Interest 0 0 0 0 0 0 0  Down, Depressed, Hopeless 0 0 0 0 0 0 0  PHQ - 2 Score 0 0 0 0 0 0 0    Review of Systems  Musculoskeletal:  Positive for gait problem.       Pain in both knees & both hips  All other systems reviewed and are negative.      Objective:   Physical Exam Vitals and nursing note reviewed.  Constitutional:      Appearance: She is obese.  Musculoskeletal:     Right lower leg: Edema present.     Left lower leg: Edema present.  Skin:    General: Skin is warm and dry.  Neurological:     Mental Status: She is alert  and oriented to person, place, and time.  Psychiatric:        Mood and Affect: Mood normal.        Behavior: Behavior normal.   General no acute distress Knee right side has full extension flexion is to 90 Knee left side has full extension flexion is to 100 No evidence of knee effusion.  There is joint line tenderness mainly along the medial joint line bilaterally. Good ankle range of motion reduced hip range of motion bilaterally There is pain with passive knee flexion starting at available range        Assessment & Plan:  #1.  End-stage osteoarthritis bilateral knees we discussed there are really no new treatment options with which would be appropriate for her.  We discussed duloxetine however she is on high dose Elavil which is a relative contraindication. She will continue on her oxycodone  10 mg 4 times daily Continue pill counts as well as urine drug screens.  Her PDMP was appropriate today She will follow-up with nurse practitioner next month We discussed that once she gets over her urology stent placement she may consider another trial of genicular nerve blocks. We discussed for the immediate postoperative time that her urologist will prescribe her pain medicine as this may only be a week or so and we can resume her usual dose.

## 2023-12-12 ENCOUNTER — Other Ambulatory Visit (HOSPITAL_COMMUNITY): Payer: Self-pay

## 2023-12-12 DIAGNOSIS — K760 Fatty (change of) liver, not elsewhere classified: Secondary | ICD-10-CM | POA: Diagnosis not present

## 2023-12-12 DIAGNOSIS — Z6841 Body Mass Index (BMI) 40.0 and over, adult: Secondary | ICD-10-CM | POA: Diagnosis not present

## 2023-12-12 DIAGNOSIS — E66813 Obesity, class 3: Secondary | ICD-10-CM | POA: Diagnosis not present

## 2023-12-12 DIAGNOSIS — R632 Polyphagia: Secondary | ICD-10-CM | POA: Diagnosis not present

## 2023-12-12 DIAGNOSIS — I10 Essential (primary) hypertension: Secondary | ICD-10-CM | POA: Diagnosis not present

## 2023-12-12 DIAGNOSIS — G4733 Obstructive sleep apnea (adult) (pediatric): Secondary | ICD-10-CM | POA: Diagnosis not present

## 2023-12-12 MED ORDER — PHENTERMINE HCL 37.5 MG PO TABS
ORAL_TABLET | ORAL | 0 refills | Status: AC
  Filled 2024-04-11: qty 45, 30d supply, fill #0

## 2023-12-12 NOTE — Progress Notes (Signed)
 COVID Vaccine Completed: yes  Date of COVID positive in last 90 days:  PCP - Lamar Ash, MD Cardiologist - Teddy Crape, MD LOV 01/18/21  Chest x-ray - N/A EKG - N/A Stress Test - 03/15/17 Epic ECHO - 02/14/21 Epic Cardiac Cath - 03/26/2017 Epic Pacemaker/ICD device last checked:N/A Spinal Cord Stimulator:N/A  Bowel Prep - N/A  Sleep Study -  CPAP -   Fasting Blood Sugar - N/A Checks Blood Sugar _____ times a day  Last dose of GLP1 agonist-  N/A GLP1 instructions:  Do not take after     Last dose of SGLT-2 inhibitors-  N/A SGLT-2 instructions:  Do not take after     Blood Thinner Instructions: N/A Last dose:   Time: Aspirin  Instructions: ASA 81 Last Dose:  Activity level:  Can go up a flight of stairs and perform activities of daily living without stopping and without symptoms of chest pain or shortness of breath.  Able to exercise without symptoms  Unable to go up a flight of stairs without symptoms of     Anesthesia review: N/A  Patient denies shortness of breath, fever, cough and chest pain at PAT appointment  Patient verbalized understanding of instructions that were given to them at the PAT appointment. Patient was also instructed that they will need to review over the PAT instructions again at home before surgery.

## 2023-12-13 ENCOUNTER — Other Ambulatory Visit: Payer: Self-pay

## 2023-12-13 ENCOUNTER — Encounter (HOSPITAL_COMMUNITY): Payer: Self-pay

## 2023-12-13 ENCOUNTER — Encounter (HOSPITAL_COMMUNITY)
Admission: RE | Admit: 2023-12-13 | Discharge: 2023-12-13 | Disposition: A | Discharge status: Home / Self Care | Source: Ambulatory Visit | Attending: Urology | Admitting: Urology

## 2023-12-13 VITALS — BP 117/72 | HR 72 | Temp 98.1°F | Resp 18 | Ht 61.0 in

## 2023-12-13 DIAGNOSIS — Z6841 Body Mass Index (BMI) 40.0 and over, adult: Secondary | ICD-10-CM | POA: Insufficient documentation

## 2023-12-13 DIAGNOSIS — G894 Chronic pain syndrome: Secondary | ICD-10-CM | POA: Diagnosis not present

## 2023-12-13 DIAGNOSIS — N200 Calculus of kidney: Secondary | ICD-10-CM | POA: Insufficient documentation

## 2023-12-13 DIAGNOSIS — I10 Essential (primary) hypertension: Secondary | ICD-10-CM | POA: Diagnosis not present

## 2023-12-13 DIAGNOSIS — G43909 Migraine, unspecified, not intractable, without status migrainosus: Secondary | ICD-10-CM | POA: Diagnosis not present

## 2023-12-13 DIAGNOSIS — Z01818 Encounter for other preprocedural examination: Secondary | ICD-10-CM | POA: Diagnosis not present

## 2023-12-13 DIAGNOSIS — F1120 Opioid dependence, uncomplicated: Secondary | ICD-10-CM | POA: Diagnosis not present

## 2023-12-13 DIAGNOSIS — G4733 Obstructive sleep apnea (adult) (pediatric): Secondary | ICD-10-CM | POA: Insufficient documentation

## 2023-12-13 HISTORY — DX: Pneumonia, unspecified organism: J18.9

## 2023-12-13 HISTORY — DX: Personal history of urinary calculi: Z87.442

## 2023-12-13 HISTORY — DX: Atherosclerotic heart disease of native coronary artery without angina pectoris: I25.10

## 2023-12-13 LAB — CBC
HCT: 38 % (ref 36.0–46.0)
Hemoglobin: 12.4 g/dL (ref 12.0–15.0)
MCH: 30 pg (ref 26.0–34.0)
MCHC: 32.6 g/dL (ref 30.0–36.0)
MCV: 91.8 fL (ref 80.0–100.0)
Platelets: 336 K/uL (ref 150–400)
RBC: 4.14 MIL/uL (ref 3.87–5.11)
RDW: 12.6 % (ref 11.5–15.5)
WBC: 6.8 K/uL (ref 4.0–10.5)
nRBC: 0 % (ref 0.0–0.2)

## 2023-12-13 LAB — BASIC METABOLIC PANEL WITH GFR
Anion gap: 11 (ref 5–15)
BUN: 22 mg/dL (ref 8–23)
CO2: 21 mmol/L — ABNORMAL LOW (ref 22–32)
Calcium: 10.2 mg/dL (ref 8.9–10.3)
Chloride: 105 mmol/L (ref 98–111)
Creatinine, Ser: 1.01 mg/dL — ABNORMAL HIGH (ref 0.44–1.00)
GFR, Estimated: >60 mL/min (ref >60)
Glucose, Bld: 112 mg/dL — ABNORMAL HIGH (ref 70–99)
Potassium: 4 mmol/L (ref 3.5–5.1)
Sodium: 136 mmol/L (ref 135–145)

## 2023-12-13 NOTE — Progress Notes (Signed)
 Collect urine culture DOS. Patient unable to go at PAT

## 2023-12-13 NOTE — Patient Instructions (Addendum)
 SURGICAL WAITING ROOM VISITATION  Patients having surgery or a procedure may have no more than 2 support people in the waiting area - these visitors may rotate.    Children under the age of 29 must have an adult with them who is not the patient.  Visitors with respiratory illnesses are discouraged from visiting and should remain at home.  If the patient needs to stay at the hospital during part of their recovery, the visitor guidelines for inpatient rooms apply. Pre-op nurse will coordinate an appropriate time for 1 support person to accompany patient in pre-op.  This support person may not rotate.    Please refer to the [facility] website for the visitor guidelines for Inpatients (after your surgery is over and you are in a regular room).    Your procedure is scheduled on: 12/26/23   Report to [facility] Main Entrance    Report to admitting at 6:15 AM   Call this number if you have problems the morning of surgery [phone]   Do not eat food or drink liquids :After Midnight.          If you have questions, please contact your surgeon's office.   FOLLOW BOWEL PREP AND ANY ADDITIONAL PRE OP INSTRUCTIONS YOU RECEIVED FROM YOUR SURGEON'S OFFICE!!!     Oral Hygiene is also important to reduce your risk of infection.                                    Remember - BRUSH YOUR TEETH THE MORNING OF SURGERY WITH YOUR REGULAR TOOTHPASTE  DENTURES WILL BE REMOVED PRIOR TO SURGERY PLEASE DO NOT APPLY Poly grip OR ADHESIVES!!!   Stop all vitamins and herbal supplements 7 days before surgery.   Take these medicines the morning of surgery with A SIP OF WATER: Inhalers, Atorvastatin , Oxycodone                                You may not have any metal on your body including hair pins, jewelry, and body piercing             Do not wear make-up, lotions, powders, perfumes, or deodorant  Do not wear nail polish including gel and S&S, artificial/acrylic nails, or any other type of  covering on natural nails including finger and toenails. If you have artificial nails, gel coating, etc. that needs to be removed by a nail salon please have this removed prior to surgery or surgery may need to be canceled/ delayed if the surgeon/ anesthesia feels like they are unable to be safely monitored.   Do not shave  48 hours prior to surgery.    Do not bring valuables to the hospital. [place] IS NOT             RESPONSIBLE   FOR VALUABLES.   Contacts, glasses, dentures or bridgework may not be worn into surgery.   Bring small overnight bag day of surgery.   DO NOT BRING YOUR HOME MEDICATIONS TO THE HOSPITAL. PHARMACY WILL DISPENSE MEDICATIONS LISTED ON YOUR MEDICATION LIST TO YOU DURING YOUR ADMISSION IN THE HOSPITAL!   Special Instructions: Bring a copy of your healthcare power of attorney and living will documents the day of surgery if you haven't scanned them before.              Please  read over the following fact sheets you were given: IF YOU HAVE QUESTIONS ABOUT YOUR PRE-OP INSTRUCTIONS PLEASE CALL [phone] [place].   If you received a COVID test during your pre-op visit  it is requested that you wear a mask when out in public, stay away from anyone that may not be feeling well and notify your surgeon if you develop symptoms. If you test positive for Covid or have been in contact with anyone that has tested positive in the last 10 days please notify you surgeon.    [place] - Preparing for Surgery Before surgery, you can play an important role.  Because skin is not sterile, your skin needs to be as free of germs as possible.  You can reduce the number of germs on your skin by washing with CHG (chlorahexidine gluconate) soap before surgery.  CHG is an antiseptic cleaner which kills germs and bonds with the skin to continue killing germs even after washing. Please DO NOT use if you have an allergy to CHG or antibacterial soaps.  If your skin becomes reddened/irritated  stop using the CHG and inform your nurse when you arrive at Short Stay. Do not shave (including legs and underarms) for at least 48 hours prior to the first CHG shower.  You may shave your face/neck.  Please follow these instructions carefully:  1.  Shower with CHG Soap the night before surgery and the  morning of surgery.  2.  If you choose to wash your hair, wash your hair first as usual with your normal  shampoo.  3.  After you shampoo, rinse your hair and body thoroughly to remove the shampoo.                             4.  Use CHG as you would any other liquid soap.  You can apply chg directly to the skin and wash.  Gently with a scrungie or clean washcloth.  5.  Apply the CHG Soap to your body ONLY FROM THE NECK DOWN.   Do   not use on face/ open                           Wound or open sores. Avoid contact with eyes, ears mouth and   genitals (private parts).                       Wash face,  Genitals (private parts) with your normal soap.             6.  Wash thoroughly, paying special attention to the area where your    surgery  will be performed.  7.  Thoroughly rinse your body with warm water from the neck down.  8.  DO NOT shower/wash with your normal soap after using and rinsing off the CHG Soap.                9.  Pat yourself dry with a clean towel.            10.  Wear clean pajamas.            11.  Place clean sheets on your bed the night of your first shower and do not  sleep with pets. Day of Surgery : Do not apply any lotions/deodorants the morning of surgery.  Please wear clean clothes to the hospital/surgery center.  FAILURE TO FOLLOW THESE INSTRUCTIONS MAY RESULT IN THE CANCELLATION OF YOUR SURGERY  PATIENT SIGNATURE_________________________________  NURSE SIGNATURE__________________________________  ________________________________________________________________________

## 2023-12-18 ENCOUNTER — Encounter (HOSPITAL_COMMUNITY): Payer: Self-pay

## 2023-12-18 ENCOUNTER — Other Ambulatory Visit: Payer: Self-pay

## 2023-12-18 NOTE — Anesthesia Preprocedure Evaluation (Addendum)
 Anesthesia Evaluation  Patient identified by MRN, date of birth, ID band Patient awake    Reviewed: Allergy & Precautions, H&P , NPO status , Patient's Chart, lab work & pertinent test results  History of Anesthesia Complications (+) Family history of anesthesia reaction and history of anesthetic complications  Airway Mallampati: II  TM Distance: >3 FB Neck ROM: Full    Dental no notable dental hx. (+) Teeth Intact, Dental Advisory Given   Pulmonary neg pulmonary ROS, sleep apnea , pneumonia   Pulmonary exam normal breath sounds clear to auscultation       Cardiovascular Exercise Tolerance: Good hypertension, Pt. on medications negative cardio ROS Normal cardiovascular exam+ Valvular Problems/Murmurs  Rhythm:Regular Rate:Normal  ECHO 11/22 1. Left ventricular ejection fraction, by estimation, is 55 to 60%. The left ventricle has normal function. The left ventricle has no regional wall motion abnormalities. The left ventricular internal cavity size was mildly dilated. Left ventricular diastolic parameters are consistent with Grade I diastolic dysfunction (impaired relaxation).  2. Right ventricular systolic function is normal. The right ventricular size is normal.  3. The mitral valve is normal in structure. No evidence of mitral valve regurgitation. No evidence of mitral stenosis.  4. The aortic valve is normal in structure. Aortic valve regurgitation is not visualized. No aortic stenosis is present.  5. The inferior vena cava is normal in size with greater than 50% respiratory variability, suggesting right atrial pressure of 3 mmHg.   Left heart cath 03/26/2017:   Conclusions: No angiographically significant coronary artery disease.  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 Upper normal to mildly elevated LVEDP with hyperdynamic left ventricular contraction.    Neuro/Psych  Headaches negative neurological  ROS  negative psych ROS   GI/Hepatic negative GI ROS, Neg liver ROS,,,  Endo/Other  negative endocrine ROS    Renal/GU negative Renal ROS  negative genitourinary   Musculoskeletal negative musculoskeletal ROS (+) Arthritis ,    Abdominal   Peds negative pediatric ROS (+)  Hematology negative hematology ROS (+)   Anesthesia Other Findings   Reproductive/Obstetrics negative OB ROS                              Anesthesia Physical Anesthesia Plan  ASA: 3  Anesthesia Plan: General   Post-op Pain Management: Tylenol  PO (pre-op)*, Minimal or no pain anticipated and Gabapentin PO (pre-op)*   Induction: Intravenous  PONV Risk Score and Plan: 3 and Ondansetron , Dexamethasone and Treatment may vary due to age or medical condition  Airway Management Planned: LMA  Additional Equipment: None  Intra-op Plan:   Post-operative Plan:   Informed Consent: I have reviewed the patients History and Physical, chart, labs and discussed the procedure including the risks, benefits and alternatives for the proposed anesthesia with the patient or authorized representative who has indicated his/her understanding and acceptance.       Plan Discussed with: CRNA, Surgeon and Anesthesiologist  Anesthesia Plan Comments: (See PAT note from 9/25 DISCUSSION: Anita Booker is a 63 yo female with PMH of HTN, OSA (CPAP intolerance), migraines, chronic pain syndrome with narcotic dependence, obesity (BMI 47).   Prior anesthesia complication includes intra-op awareness. Prior family hx of anesthesia includes cardiac arrest for sister (patient unsure of details).   Patient evaluated by Cardiology in the past for chest pain and abnormal stress testing. She underwent cardiac cath in 2019 that showed no CAD.   )  Anesthesia Quick Evaluation

## 2023-12-18 NOTE — Progress Notes (Signed)
 Case: [id number] Date/Time: 12/26/23 0815   Procedure: NEPHROLITHOTOMY (Right) - RIGHT PERCUTANEOUS NEPHROLITHOTOMY   Anesthesia type: General   Diagnosis: Kidney stone [N20.0]   Pre-op diagnosis: RIGHT STAGHORN CALCULI   Location: [facility] ROOM 05 / [facility]   Surgeons: Anita Morene ORN, MD       DISCUSSION: Anita Booker is a 63 yo female with PMH of HTN, OSA (CPAP intolerance), migraines, chronic pain syndrome with narcotic dependence, obesity (BMI 47).  Prior anesthesia complication includes intra-op awareness. Prior family hx of anesthesia includes cardiac arrest for sister (patient unsure of details).  Patient evaluated by Cardiology in the past for chest pain and abnormal stress testing. She underwent cardiac cath in 2019 that showed no CAD.   VS: BP 117/72   Pulse 72   Temp 36.7 C (Oral)   Resp 18   Ht 5' 1 (1.549 m)   LMP  (LMP Unknown)   SpO2 100%   BMI 47.05 kg/m   PROVIDERS: Anita Charleston, MD   LABS: Labs reviewed: Acceptable for surgery. (all labs ordered are listed, but only abnormal results are displayed)  Labs Reviewed  BASIC METABOLIC PANEL WITH GFR - Abnormal; Notable for the following components:      Result Value   CO2 21 (*)    Glucose, Bld 112 (*)    Creatinine, Ser 1.01 (*)    All other components within normal limits  URINE CULTURE  CBC     IMAGES:   EKG 12/13/23  Normal sinus rhythm Nonspecific T wave abnormality Abnormal ECG  Echo 02/15/2021:  IMPRESSIONS    1. Left ventricular ejection fraction, by estimation, is 55 to 60%. The left ventricle has normal function. The left ventricle has no regional wall motion abnormalities. The left ventricular internal cavity size was mildly dilated. Left ventricular diastolic parameters are consistent with Grade I diastolic dysfunction (impaired relaxation).  2. Right ventricular systolic function is normal. The right ventricular size is normal.  3. The mitral valve is normal in structure.  No evidence of mitral valve regurgitation. No evidence of mitral stenosis.  4. The aortic valve is normal in structure. Aortic valve regurgitation is not visualized. No aortic stenosis is present.  5. The inferior vena cava is normal in size with greater than 50% respiratory variability, suggesting right atrial pressure of 3 mmHg.  Left heart cath 03/26/2017:  Conclusions: No angiographically significant coronary artery disease.  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 Upper normal to mildly elevated LVEDP with hyperdynamic left ventricular contraction.   Recommendations: Medical therapy and evaluation for alternative causes of the patient's chest pain.  I do not believe that the small, anomalous atrial branch explains her symptoms. Past Medical History:  Diagnosis Date   Abnormal nuclear cardiac imaging test 03/23/2017   Arthritis    Bilateral primary osteoarthritis of knee 06/05/2016   Cellulitis and abscess of trunk 04/30/2012   Chest pain 02/13/2017   Chronic pain of left knee 10/31/2011   Significant medial compartment DJD bilaterally. Both knees had steroid injection done on October 31, 2011   Chronic pain syndrome 09/28/2016   Complication of anesthesia 1995   woke up on table right after gallbladder surgery   Constipation    Corneal ulcer 08/06/2012   Coronary artery disease    Edema, lower extremity    Essential hypertension 02/13/2017   Family history of adverse reaction to anesthesia    sister coded twice with anesthesai not sure of details sister still living  Family history of breast cancer    Family history of uterine cancer    Gallbladder problem    Genetic testing 09/07/2016   Negative genetic testing on the Common Hereditary cancer panel.  The Hereditary Gene Panel offered by [facility] includes sequencing and/or deletion duplication testing of the following 46 genes: APC, ATM, AXIN2, BARD1, BMPR1A, BRCA1, BRCA2, BRIP1, CDH1, CDKN2A (p14ARF), CDKN2A  (p16INK4a), CHEK2, CTNNA1, DICER1, EPCAM (Deletion/duplication testing only), GREM1 (promoter region deletion/duplication te   Headache    migraines   History of kidney stones    Joint pain    Knee pain    Leg pain    Migraine    Mixed dyslipidemia 02/13/2017   Morbid obesity (HCC) 02/13/2017   Osteoarthritis    Pneumonia    Sleep apnea    SOBOE (shortness of breath on exertion)     Past Surgical History:  Procedure Laterality Date   BREAST EXCISIONAL BIOPSY Right    infection to right breast 5 years ago.   BREAST SURGERY  07/01/2011.   I & D of right breast abscess   CHOLECYSTECTOMY  1995   COLONOSCOPY WITH PROPOFOL  N/A 07/05/2016   Procedure: COLONOSCOPY WITH PROPOFOL ;  Surgeon: Anita Cree, MD;  Location: [facility];  Service: Endoscopy;  Laterality: N/A;   LEFT HEART CATH AND CORONARY ANGIOGRAPHY N/A 03/26/2017   Procedure: LEFT HEART CATH AND CORONARY ANGIOGRAPHY;  Surgeon: Anita Bruckner, MD;  Location: [facility];  Service: Cardiovascular;  Laterality: N/A;    MEDICATIONS:  albuterol  (VENTOLIN  HFA) 108 (90 Base) MCG/ACT inhaler   aspirin  EC 81 MG tablet   atorvastatin  (LIPITOR) 40 MG tablet   Calcium  Carbonate (CALCIUM  600 PO)   celecoxib  (CELEBREX ) 200 MG capsule   cholecalciferol (VITAMIN D) 1000 units tablet   clobetasol  (TEMOVATE ) 0.05 % external solution   furosemide  (LASIX ) 20 MG tablet   hydrOXYzine  (ATARAX ) 25 MG tablet   Multiple Vitamins-Minerals (MULTIVITAMIN WITH MINERALS) tablet   nitroGLYCERIN  (NITROSTAT ) 0.4 MG SL tablet   nortriptyline  (PAMELOR ) 50 MG capsule   nortriptyline  (PAMELOR ) 50 MG capsule   Oxycodone  HCl 10 MG TABS   phentermine  (ADIPEX-P ) 37.5 MG tablet   Semaglutide -Weight Management (WEGOVY ) 0.5 MG/0.5ML SOAJ   Semaglutide -Weight Management (WEGOVY ) 1 MG/0.5ML SOAJ   spironolactone  (ALDACTONE ) 100 MG tablet   tirzepatide  (ZEPBOUND ) 5 MG/0.5ML Pen   tretinoin  (RETIN-A ) 0.1 % cream   Trospium  Chloride 60 MG CP24    valsartan  (DIOVAN ) 320 MG tablet   zolpidem  (AMBIEN ) 10 MG tablet   No current facility-administered medications for this encounter.   Anita Booker [facility] Short Stay/Anesthesiology [facility] Phone [phone] 12/18/2023 9:46 AM

## 2023-12-19 ENCOUNTER — Other Ambulatory Visit (HOSPITAL_COMMUNITY): Payer: Self-pay

## 2023-12-26 ENCOUNTER — Inpatient Hospital Stay (HOSPITAL_COMMUNITY): Payer: Self-pay | Admitting: Anesthesiology

## 2023-12-26 ENCOUNTER — Inpatient Hospital Stay (HOSPITAL_COMMUNITY)

## 2023-12-26 ENCOUNTER — Other Ambulatory Visit: Payer: Self-pay

## 2023-12-26 ENCOUNTER — Inpatient Hospital Stay (HOSPITAL_COMMUNITY): Payer: Self-pay | Admitting: Physician Assistant

## 2023-12-26 ENCOUNTER — Encounter (HOSPITAL_COMMUNITY)
Admission: RE | Disposition: A | Discharge status: Home / Self Care | Payer: Self-pay | Source: Home / Self Care | Attending: Urology

## 2023-12-26 ENCOUNTER — Inpatient Hospital Stay (HOSPITAL_COMMUNITY)
Admission: RE | Admit: 2023-12-26 | Discharge: 2023-12-28 | DRG: 660 | Disposition: A | Discharge status: Home / Self Care | Attending: Urology | Admitting: Urology

## 2023-12-26 ENCOUNTER — Encounter (HOSPITAL_COMMUNITY): Payer: Self-pay | Admitting: Urology

## 2023-12-26 DIAGNOSIS — I10 Essential (primary) hypertension: Secondary | ICD-10-CM | POA: Diagnosis not present

## 2023-12-26 DIAGNOSIS — G8929 Other chronic pain: Secondary | ICD-10-CM | POA: Diagnosis present

## 2023-12-26 DIAGNOSIS — I509 Heart failure, unspecified: Secondary | ICD-10-CM | POA: Diagnosis not present

## 2023-12-26 DIAGNOSIS — N1330 Unspecified hydronephrosis: Secondary | ICD-10-CM | POA: Diagnosis not present

## 2023-12-26 DIAGNOSIS — Z9109 Other allergy status, other than to drugs and biological substances: Secondary | ICD-10-CM

## 2023-12-26 DIAGNOSIS — Z7982 Long term (current) use of aspirin: Secondary | ICD-10-CM

## 2023-12-26 DIAGNOSIS — Z6841 Body Mass Index (BMI) 40.0 and over, adult: Secondary | ICD-10-CM

## 2023-12-26 DIAGNOSIS — N200 Calculus of kidney: Secondary | ICD-10-CM

## 2023-12-26 DIAGNOSIS — G4730 Sleep apnea, unspecified: Secondary | ICD-10-CM | POA: Diagnosis not present

## 2023-12-26 DIAGNOSIS — M170 Bilateral primary osteoarthritis of knee: Secondary | ICD-10-CM | POA: Diagnosis not present

## 2023-12-26 DIAGNOSIS — R188 Other ascites: Secondary | ICD-10-CM | POA: Diagnosis not present

## 2023-12-26 DIAGNOSIS — Z01818 Encounter for other preprocedural examination: Secondary | ICD-10-CM

## 2023-12-26 DIAGNOSIS — I110 Hypertensive heart disease with heart failure: Secondary | ICD-10-CM | POA: Diagnosis not present

## 2023-12-26 DIAGNOSIS — E119 Type 2 diabetes mellitus without complications: Secondary | ICD-10-CM | POA: Diagnosis present

## 2023-12-26 DIAGNOSIS — Z79899 Other long term (current) drug therapy: Secondary | ICD-10-CM

## 2023-12-26 DIAGNOSIS — M160 Bilateral primary osteoarthritis of hip: Secondary | ICD-10-CM | POA: Diagnosis not present

## 2023-12-26 DIAGNOSIS — N3946 Mixed incontinence: Secondary | ICD-10-CM | POA: Diagnosis present

## 2023-12-26 DIAGNOSIS — Z885 Allergy status to narcotic agent status: Secondary | ICD-10-CM

## 2023-12-26 DIAGNOSIS — N2889 Other specified disorders of kidney and ureter: Secondary | ICD-10-CM | POA: Diagnosis not present

## 2023-12-26 DIAGNOSIS — R32 Unspecified urinary incontinence: Secondary | ICD-10-CM | POA: Diagnosis present

## 2023-12-26 HISTORY — PX: NEPHROLITHOTOMY: SHX5134

## 2023-12-26 LAB — CBC
HCT: 36.8 % (ref 36.0–46.0)
Hemoglobin: 11.2 g/dL — ABNORMAL LOW (ref 12.0–15.0)
MCH: 28.6 pg (ref 26.0–34.0)
MCHC: 30.4 g/dL (ref 30.0–36.0)
MCV: 94.1 fL (ref 80.0–100.0)
Platelets: 285 K/uL (ref 150–400)
RBC: 3.91 MIL/uL (ref 3.87–5.11)
RDW: 12.4 % (ref 11.5–15.5)
WBC: 20.3 K/uL — ABNORMAL HIGH (ref 4.0–10.5)
nRBC: 0 % (ref 0.0–0.2)

## 2023-12-26 LAB — BASIC METABOLIC PANEL WITH GFR
Anion gap: 11 (ref 5–15)
BUN: 23 mg/dL (ref 8–23)
CO2: 20 mmol/L — ABNORMAL LOW (ref 22–32)
Calcium: 9.6 mg/dL (ref 8.9–10.3)
Chloride: 97 mmol/L — ABNORMAL LOW (ref 98–111)
Creatinine, Ser: 1.2 mg/dL — ABNORMAL HIGH (ref 0.44–1.00)
GFR, Estimated: 50 mL/min — ABNORMAL LOW (ref >60)
Glucose, Bld: 160 mg/dL — ABNORMAL HIGH (ref 70–99)
Potassium: 4.2 mmol/L (ref 3.5–5.1)
Sodium: 128 mmol/L — ABNORMAL LOW (ref 135–145)

## 2023-12-26 LAB — HEMOGLOBIN A1C
Hgb A1c MFr Bld: 4.8 % (ref 4.8–5.6)
Mean Plasma Glucose: 91.06 mg/dL

## 2023-12-26 LAB — GLUCOSE, CAPILLARY: Glucose-Capillary: 174 mg/dL — ABNORMAL HIGH (ref 70–99)

## 2023-12-26 SURGERY — NEPHROLITHOTOMY
Anesthesia: General | Laterality: Right

## 2023-12-26 MED ORDER — OXYCODONE HCL 5 MG PO TABS: 5.0000 mg | ORAL_TABLET | Freq: Once | ORAL | Status: DC | PRN

## 2023-12-26 MED ORDER — HYDROMORPHONE HCL 1 MG/ML IJ SOLN: 0.5000 mg | INTRAMUSCULAR | Status: DC | PRN

## 2023-12-26 MED ORDER — CIPROFLOXACIN IN D5W 400 MG/200ML IV SOLN
400.0000 mg | Freq: Two times a day (BID) | INTRAVENOUS | Status: DC
  Administered 2023-12-26: 400 mg via INTRAVENOUS
  Filled 2023-12-26: qty 200

## 2023-12-26 MED ORDER — SUGAMMADEX SODIUM 200 MG/2ML IV SOLN
INTRAVENOUS | Status: AC
  Filled 2023-12-26: qty 2

## 2023-12-26 MED ORDER — LIDOCAINE HCL (CARDIAC) PF 100 MG/5ML IV SOSY
PREFILLED_SYRINGE | INTRAVENOUS | Status: DC | PRN
  Administered 2023-12-26: 100 mg via INTRAVENOUS

## 2023-12-26 MED ORDER — KETAMINE HCL 50 MG/5ML IJ SOSY
PREFILLED_SYRINGE | INTRAMUSCULAR | Status: AC
  Filled 2023-12-26: qty 5

## 2023-12-26 MED ORDER — OXYCODONE HCL 5 MG PO TABS
ORAL_TABLET | ORAL | Status: AC
  Filled 2023-12-26: qty 2

## 2023-12-26 MED ORDER — LEVOFLOXACIN IN D5W 500 MG/100ML IV SOLN
500.0000 mg | INTRAVENOUS | Status: DC
  Administered 2023-12-26: 500 mg via INTRAVENOUS
  Filled 2023-12-26: qty 100

## 2023-12-26 MED ORDER — ATORVASTATIN CALCIUM 40 MG PO TABS
40.0000 mg | ORAL_TABLET | Freq: Every day | ORAL | Status: DC
  Administered 2023-12-26 – 2023-12-28 (×3): 40 mg via ORAL
  Filled 2023-12-26 (×3): qty 1

## 2023-12-26 MED ORDER — BUPIVACAINE-EPINEPHRINE 0.5% -1:200000 IJ SOLN
INTRAMUSCULAR | Status: DC | PRN
  Administered 2023-12-26: 30 mL

## 2023-12-26 MED ORDER — DEXAMETHASONE SODIUM PHOSPHATE 10 MG/ML IJ SOLN
INTRAMUSCULAR | Status: AC
  Filled 2023-12-26: qty 1

## 2023-12-26 MED ORDER — SODIUM CHLORIDE 0.9 % IV SOLN: INTRAVENOUS | Status: DC

## 2023-12-26 MED ORDER — ONDANSETRON HCL 4 MG/2ML IJ SOLN
INTRAMUSCULAR | Status: DC | PRN
  Administered 2023-12-26: 4 mg via INTRAVENOUS

## 2023-12-26 MED ORDER — CHLORHEXIDINE GLUCONATE CLOTH 2 % EX PADS
6.0000 | MEDICATED_PAD | Freq: Every day | CUTANEOUS | Status: DC
  Administered 2023-12-27: 6 via TOPICAL

## 2023-12-26 MED ORDER — PHENYLEPHRINE HCL-NACL 20-0.9 MG/250ML-% IV SOLN
INTRAVENOUS | Status: DC | PRN
  Administered 2023-12-26: 25 ug/min via INTRAVENOUS

## 2023-12-26 MED ORDER — DEXMEDETOMIDINE HCL IN NACL 80 MCG/20ML IV SOLN
INTRAVENOUS | Status: AC
  Filled 2023-12-26: qty 20

## 2023-12-26 MED ORDER — ROCURONIUM BROMIDE 10 MG/ML (PF) SYRINGE
PREFILLED_SYRINGE | INTRAVENOUS | Status: AC
  Filled 2023-12-26: qty 10

## 2023-12-26 MED ORDER — ACETAMINOPHEN 500 MG PO TABS
1000.0000 mg | ORAL_TABLET | Freq: Four times a day (QID) | ORAL | Status: AC
  Administered 2023-12-26 – 2023-12-27 (×4): 1000 mg via ORAL
  Filled 2023-12-26 (×4): qty 2

## 2023-12-26 MED ORDER — IOHEXOL 300 MG/ML  SOLN
INTRAMUSCULAR | Status: DC | PRN
  Administered 2023-12-26: 100 mL

## 2023-12-26 MED ORDER — MIDAZOLAM HCL 2 MG/2ML IJ SOLN
INTRAMUSCULAR | Status: AC
Start: 2023-12-26 — End: 2023-12-26
  Filled 2023-12-26: qty 2

## 2023-12-26 MED ORDER — OXYCODONE HCL 5 MG PO TABS
10.0000 mg | ORAL_TABLET | Freq: Four times a day (QID) | ORAL | Status: DC | PRN
  Administered 2023-12-26 – 2023-12-28 (×3): 10 mg via ORAL
  Filled 2023-12-26 (×2): qty 2

## 2023-12-26 MED ORDER — DEXMEDETOMIDINE HCL IN NACL 80 MCG/20ML IV SOLN
INTRAVENOUS | Status: DC | PRN
  Administered 2023-12-26: 8 ug via INTRAVENOUS
  Administered 2023-12-26: 4 ug via INTRAVENOUS
  Administered 2023-12-26: 8 ug via INTRAVENOUS

## 2023-12-26 MED ORDER — OXYCODONE HCL 5 MG/5ML PO SOLN: 5.0000 mg | Freq: Once | ORAL | Status: DC | PRN

## 2023-12-26 MED ORDER — 0.9 % SODIUM CHLORIDE (POUR BTL) OPTIME
TOPICAL | Status: DC | PRN
  Administered 2023-12-26: 1000 mL

## 2023-12-26 MED ORDER — ORAL CARE MOUTH RINSE: 15.0000 mL | Freq: Once | OROMUCOSAL | Status: AC

## 2023-12-26 MED ORDER — INSULIN ASPART 100 UNIT/ML IJ SOLN: 0.0000 [IU] | Freq: Three times a day (TID) | INTRAMUSCULAR | Status: DC

## 2023-12-26 MED ORDER — PROPOFOL 10 MG/ML IV BOLUS
INTRAVENOUS | Status: AC
  Filled 2023-12-26: qty 20

## 2023-12-26 MED ORDER — FENTANYL CITRATE (PF) 100 MCG/2ML IJ SOLN
INTRAMUSCULAR | Status: DC | PRN
  Administered 2023-12-26: 100 ug via INTRAVENOUS
  Administered 2023-12-26 (×2): 25 ug via INTRAVENOUS
  Administered 2023-12-26: 50 ug via INTRAVENOUS

## 2023-12-26 MED ORDER — POTASSIUM CHLORIDE IN NACL 20-0.45 MEQ/L-% IV SOLN
INTRAVENOUS | Status: DC
  Filled 2023-12-26: qty 1000

## 2023-12-26 MED ORDER — SENNOSIDES-DOCUSATE SODIUM 8.6-50 MG PO TABS
2.0000 | ORAL_TABLET | Freq: Every day | ORAL | Status: DC
  Administered 2023-12-26 – 2023-12-27 (×2): 2 via ORAL
  Filled 2023-12-26 (×2): qty 2

## 2023-12-26 MED ORDER — ROCURONIUM BROMIDE 100 MG/10ML IV SOLN
INTRAVENOUS | Status: DC | PRN
  Administered 2023-12-26: 20 mg via INTRAVENOUS
  Administered 2023-12-26: 30 mg via INTRAVENOUS
  Administered 2023-12-26: 70 mg via INTRAVENOUS

## 2023-12-26 MED ORDER — HYDROMORPHONE HCL 1 MG/ML IJ SOLN
INTRAMUSCULAR | Status: DC | PRN
  Administered 2023-12-26: .5 mg via INTRAVENOUS

## 2023-12-26 MED ORDER — KETAMINE HCL 50 MG/5ML IJ SOSY
PREFILLED_SYRINGE | INTRAMUSCULAR | Status: DC | PRN
  Administered 2023-12-26 (×2): 20 mg via INTRAVENOUS
  Administered 2023-12-26: 10 mg via INTRAVENOUS

## 2023-12-26 MED ORDER — SPIRONOLACTONE 25 MG PO TABS
100.0000 mg | ORAL_TABLET | Freq: Every day | ORAL | Status: DC
  Administered 2023-12-26 – 2023-12-28 (×2): 100 mg via ORAL
  Filled 2023-12-26 (×2): qty 4

## 2023-12-26 MED ORDER — NORTRIPTYLINE HCL 25 MG PO CAPS
100.0000 mg | ORAL_CAPSULE | Freq: Every day | ORAL | Status: DC
  Administered 2023-12-26 – 2023-12-27 (×2): 100 mg via ORAL
  Filled 2023-12-26 (×2): qty 4

## 2023-12-26 MED ORDER — PROPOFOL 10 MG/ML IV BOLUS
INTRAVENOUS | Status: DC | PRN
  Administered 2023-12-26: 200 mg via INTRAVENOUS

## 2023-12-26 MED ORDER — ONDANSETRON HCL 4 MG/2ML IJ SOLN
INTRAMUSCULAR | Status: AC
  Filled 2023-12-26: qty 2

## 2023-12-26 MED ORDER — ONDANSETRON HCL 4 MG/2ML IJ SOLN: 4.0000 mg | INTRAMUSCULAR | Status: DC | PRN

## 2023-12-26 MED ORDER — GABAPENTIN 300 MG PO CAPS
300.0000 mg | ORAL_CAPSULE | ORAL | Status: AC
  Administered 2023-12-26: 300 mg via ORAL
  Filled 2023-12-26: qty 1

## 2023-12-26 MED ORDER — ACETAMINOPHEN 500 MG PO TABS
1000.0000 mg | ORAL_TABLET | Freq: Once | ORAL | Status: AC
  Administered 2023-12-26: 1000 mg via ORAL
  Filled 2023-12-26: qty 2

## 2023-12-26 MED ORDER — MIDAZOLAM HCL 5 MG/5ML IJ SOLN
INTRAMUSCULAR | Status: DC | PRN
  Administered 2023-12-26: 2 mg via INTRAVENOUS

## 2023-12-26 MED ORDER — SODIUM CHLORIDE 0.9 % IR SOLN
Status: DC | PRN
  Administered 2023-12-26: 36000 mL via INTRAVESICAL

## 2023-12-26 MED ORDER — DEXAMETHASONE SODIUM PHOSPHATE 10 MG/ML IJ SOLN
INTRAMUSCULAR | Status: DC | PRN
  Administered 2023-12-26: 5 mg via INTRAVENOUS

## 2023-12-26 MED ORDER — FENTANYL CITRATE PF 50 MCG/ML IJ SOSY
25.0000 ug | PREFILLED_SYRINGE | INTRAMUSCULAR | Status: DC | PRN
  Administered 2023-12-26 (×3): 50 ug via INTRAVENOUS

## 2023-12-26 MED ORDER — LIDOCAINE HCL (PF) 2 % IJ SOLN
INTRAMUSCULAR | Status: AC
  Filled 2023-12-26: qty 5

## 2023-12-26 MED ORDER — CHLORHEXIDINE GLUCONATE 0.12 % MT SOLN
15.0000 mL | Freq: Once | OROMUCOSAL | Status: AC
  Administered 2023-12-26: 15 mL via OROMUCOSAL

## 2023-12-26 MED ORDER — LACTATED RINGERS IV SOLN: INTRAVENOUS | Status: DC

## 2023-12-26 MED ORDER — FENTANYL CITRATE PF 50 MCG/ML IJ SOSY
PREFILLED_SYRINGE | INTRAMUSCULAR | Status: AC
  Filled 2023-12-26: qty 3

## 2023-12-26 MED ORDER — PHENYLEPHRINE 80 MCG/ML (10ML) SYRINGE FOR IV PUSH (FOR BLOOD PRESSURE SUPPORT)
PREFILLED_SYRINGE | INTRAVENOUS | Status: AC
  Filled 2023-12-26: qty 10

## 2023-12-26 MED ORDER — DICLOFENAC SODIUM 1 % EX GEL
2.0000 g | Freq: Four times a day (QID) | CUTANEOUS | Status: DC | PRN
  Administered 2023-12-26 – 2023-12-27 (×4): 2 g via TOPICAL
  Filled 2023-12-26: qty 100

## 2023-12-26 MED ORDER — PHENYLEPHRINE 80 MCG/ML (10ML) SYRINGE FOR IV PUSH (FOR BLOOD PRESSURE SUPPORT)
PREFILLED_SYRINGE | INTRAVENOUS | Status: DC | PRN
  Administered 2023-12-26: 80 ug via INTRAVENOUS
  Administered 2023-12-26: 40 ug via INTRAVENOUS
  Administered 2023-12-26 (×5): 80 ug via INTRAVENOUS

## 2023-12-26 MED ORDER — HYDROMORPHONE HCL 2 MG/ML IJ SOLN
INTRAMUSCULAR | Status: AC
  Filled 2023-12-26: qty 1

## 2023-12-26 MED ORDER — SUGAMMADEX SODIUM 200 MG/2ML IV SOLN
INTRAVENOUS | Status: DC | PRN
  Administered 2023-12-26: 450 mg via INTRAVENOUS

## 2023-12-26 MED ORDER — CELECOXIB 200 MG PO CAPS
200.0000 mg | ORAL_CAPSULE | Freq: Once | ORAL | Status: AC
  Administered 2023-12-26: 200 mg via ORAL
  Filled 2023-12-26: qty 1

## 2023-12-26 MED ORDER — KETOROLAC TROMETHAMINE 15 MG/ML IJ SOLN
15.0000 mg | Freq: Four times a day (QID) | INTRAMUSCULAR | Status: AC
  Administered 2023-12-26 – 2023-12-27 (×6): 15 mg via INTRAVENOUS
  Filled 2023-12-26 (×6): qty 1

## 2023-12-26 MED ORDER — MEPERIDINE HCL 100 MG/ML IJ SOLN: 6.2500 mg | INTRAMUSCULAR | Status: DC | PRN

## 2023-12-26 MED ORDER — FENTANYL CITRATE (PF) 100 MCG/2ML IJ SOLN
INTRAMUSCULAR | Status: AC
  Filled 2023-12-26: qty 2

## 2023-12-26 MED ORDER — PHENTERMINE HCL 37.5 MG PO TABS: 37.5000 mg | ORAL_TABLET | Freq: Every day | ORAL | Status: DC

## 2023-12-26 MED ORDER — ONDANSETRON HCL 4 MG/2ML IJ SOLN: 4.0000 mg | Freq: Once | INTRAMUSCULAR | Status: DC | PRN

## 2023-12-26 MED ORDER — BUPIVACAINE-EPINEPHRINE (PF) 0.5% -1:200000 IJ SOLN
INTRAMUSCULAR | Status: AC
  Filled 2023-12-26: qty 30

## 2023-12-26 MED ORDER — HYDROXYZINE HCL 25 MG PO TABS
25.0000 mg | ORAL_TABLET | Freq: Three times a day (TID) | ORAL | Status: DC | PRN
  Administered 2023-12-27: 25 mg via ORAL
  Filled 2023-12-26: qty 1

## 2023-12-26 SURGICAL SUPPLY — 57 items
BAG COUNTER SPONGE SURGICOUNT (BAG) IMPLANT
BAG URINE DRAIN 2000ML AR STRL (UROLOGICAL SUPPLIES) IMPLANT
BASKET STONE NCOMPASS (UROLOGICAL SUPPLIES) IMPLANT
BASKET ZERO TIP NITINOL 2.4FR (BASKET) IMPLANT
BENZOIN TINCTURE PRP APPL 2/3 (GAUZE/BANDAGES/DRESSINGS) ×1 IMPLANT
BLADE SURG 15 STRL LF DISP TIS (BLADE) ×1 IMPLANT
BOOTIES KNEE HIGH SLOAN (MISCELLANEOUS) IMPLANT
CATH 2WAY 30CC 24FR (CATHETERS) IMPLANT
CATH COUNCIL 22FR (CATHETERS) IMPLANT
CATH URETERAL DUAL LUMEN 10F (MISCELLANEOUS) ×1 IMPLANT
CATH URETL OPEN 5X70 (CATHETERS) ×1 IMPLANT
CATH UROLOGY TORQUE 40 (MISCELLANEOUS) IMPLANT
CATH UROLOGY TORQUE 65 (CATHETERS) IMPLANT
CATH X-FORCE N30 NEPHROSTOMY (TUBING) ×1 IMPLANT
CHLORAPREP W/TINT 26 (MISCELLANEOUS) ×1 IMPLANT
DRAPE C-ARM 42X120 X-RAY (DRAPES) ×1 IMPLANT
DRAPE LINGEMAN PERC (DRAPES) ×1 IMPLANT
DRAPE SURG IRRIG POUCH 19X23 (DRAPES) ×2 IMPLANT
DRSG TEGADERM 8X12 (GAUZE/BANDAGES/DRESSINGS) ×2 IMPLANT
GAUZE PAD ABD 8X10 STRL (GAUZE/BANDAGES/DRESSINGS) IMPLANT
GAUZE SPONGE 4X4 12PLY STRL (GAUZE/BANDAGES/DRESSINGS) IMPLANT
GLOVE SURG LX STRL 7.5 STRW (GLOVE) ×1 IMPLANT
GOWN STRL REUS W/ TWL XL LVL3 (GOWN DISPOSABLE) ×1 IMPLANT
GUIDEWIRE AMPLAZ .035X145 (WIRE) ×2 IMPLANT
GUIDEWIRE ANG ZIPWIRE 038X150 (WIRE) IMPLANT
GUIDEWIRE STR DUAL SENSOR (WIRE) ×1 IMPLANT
HLDR NDL AMPLATZ W/INSERTS (MISCELLANEOUS) IMPLANT
HOLDER NEEDLE AMPLATZ W/INSERT (MISCELLANEOUS) ×1 IMPLANT
IV SET EXTENSION CATH 6 NF (IV SETS) ×1 IMPLANT
KIT BASIN OR (CUSTOM PROCEDURE TRAY) ×1 IMPLANT
KIT PROBE TRILOGY 3.9X350 (MISCELLANEOUS) IMPLANT
KIT TURNOVER KIT A (KITS) ×1 IMPLANT
MANIFOLD NEPTUNE II (INSTRUMENTS) ×1 IMPLANT
MAT ABSORB FLUID 56X50 GRAY (MISCELLANEOUS) ×2 IMPLANT
NDL HYPO 25X1 1.5 SAFETY (NEEDLE) ×1 IMPLANT
NDL SPNL 20GX3.5 QUINCKE YW (NEEDLE) IMPLANT
NDL TROCAR 18X15 ECHO (NEEDLE) IMPLANT
NDL TROCAR 18X20 (NEEDLE) IMPLANT
NEEDLE HYPO 25X1 1.5 SAFETY (NEEDLE) ×1 IMPLANT
NEEDLE SPNL 20GX3.5 QUINCKE YW (NEEDLE) ×1 IMPLANT
NEEDLE TROCAR 18X15 ECHO (NEEDLE) IMPLANT
NEEDLE TROCAR 18X20 (NEEDLE) ×1 IMPLANT
NS IRRIG 1000ML POUR BTL (IV SOLUTION) ×1 IMPLANT
PACK CYSTO (CUSTOM PROCEDURE TRAY) ×1 IMPLANT
SHEATH PEELAWAY SET 9 (SHEATH) IMPLANT
SPONGE T-LAP 4X18 ~~LOC~~+RFID (SPONGE) ×1 IMPLANT
STENT URET 6FRX24 CONTOUR (STENTS) IMPLANT
SUT NYLON 3 0 (SUTURE) IMPLANT
SYR 10ML LL (SYRINGE) ×1 IMPLANT
SYR 20ML LL LF (SYRINGE) ×2 IMPLANT
SYR 50ML LL SCALE MARK (SYRINGE) ×1 IMPLANT
TOWEL OR 17X26 10 PK STRL BLUE (TOWEL DISPOSABLE) ×1 IMPLANT
TRACTIP FLEXIVA PULS ID 200XHI (Laser) IMPLANT
TRAY FOLEY MTR SLVR 16FR STAT (SET/KITS/TRAYS/PACK) ×1 IMPLANT
TUBING CONNECTING 10 (TUBING) ×1 IMPLANT
TUBING STONE CATCHER TRILOGY (MISCELLANEOUS) IMPLANT
TUBING UROLOGY SET (TUBING) ×1 IMPLANT

## 2023-12-26 NOTE — H&P (Signed)
 cc: urinary incontinence   12/23/20: 63 year old woman comes in with 1 year history of worsening mixed urinary incontinence. She reports leaking upon standing, getting as a recliner, getting the urge to go and not getting married time and with coughing. She also reports nocturia 4-6 times a night. She was scheduled for a sleep study however was canceled due to COVID. Is not yet been rescheduled. She thinks she does have sleep apnea. She drinks 1 cup of decaf coffee, diet mountain dew, glass of milk and water during the day. She stops drinking at 6:00 p.m.SABRA She denies a history of UTIs, hematuria or kidney stones.   04/21/21: 63 year old woman with history of mixed urinary continence here for follow-up. She reports the Myrbetriq  significantly helped her symptoms and she was 90% improved. 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 We did not receive this in the office and I would like to have it so we can try and get medication at reduced cost through the My[employer]bet[employer]  assistance program. In addition she tried pelvic floor physical therapy but did not feel it was a good fit.   07/29/2021: 63 year old woman with a history of mixed urinary incontinence, urgency and frequency here for follow-up. She is done very well on Myrbetriq  50 mg daily. This was denied by her insurance and she did not qualify for the payment assistance plan from [employer] 's company. She did try pelvic floor physical therapy but did not think is a good fit for her.   11/11/2021: 63 year old woman with a history of mixed urinary continence, urinary urgency and frequency here for follow-up. She was doing very well on Myrbetriq  50 mg daily and had difficulty with her insurance coverage. It was initially denied however an appeal was filed and was approved 3 days ago. I will send a prescription to her pharmacy. She would like to restart Myrbetriq  and stop the trospium .   09/01/22: 63 year old woman with a history of mixed urinary incontinence, urinary urgency  and frequency here for follow-up. She is gone back and forth between trospium  and Myrbetriq  due to insurance reasons. She would like to switch back to Myrbetriq  as her insurance has recently changed. This is working well for her. She denies any hematuria or UTIs in the interim. She is taking a trip to [place] with her family soon.   09/05/2023: 63 year old woman with a history of mixed urinary incontinence, urinary urgency and frequency here for follow-up. She is currently using trospium  due to cost. She has noticed a odor to her urine for the last year. She has no other urinary symptoms or change in urinary symptoms. She had an abdominal ultrasound done that showed a 16 mm right renal calculus.   10/25/2023: Patient presents today for discussion of right PCNL for large partial right staghorn stone. She is asymptomatic, found during a liver ultrasound. She had a CT scan demonstrating a branched stone in her right kidney. She denies any hematuria or pain. She complains of malodorus urine. She has severe osteoarthritis in both knees and hips resulting in her inabiltiy to walk. She denies diabetes or heart disease. She is the primary care giver for her husband who has alzheimers.     ALLERGIES: Adhesive tape Butrans  PTWK Codeine     MEDICATIONS: Lipitor 40 MG Tablet  Trospium  Chloride ER 60 MG Capsule Extended Release 24 Hour 1 capsule PO Daily  Trospium  Chloride ER 60 MG Capsule Extended Release 24 Hour 1 capsule PO Daily  Aerochamber Mv  Aspirin  Regimen 81 MG Tablet Delayed Release  Belbuca  75 MCG Film  Calcium   CeleBREX  200 MG Capsule  Centrum  Diovan  HCT 320-25 MG Tablet  Lasix  20 MG Tablet  Nortriptyline  HCl 50 MG Capsule  Trospium  Chloride ER 60 MG Capsule Extended Release 24 Hour 1 capsule PO Daily  Zolpidem  Tartrate     GU PSH: No GU PSH    NON-GU PSH: Breast Surgery Procedure Cholecystectomy (open) Tonsillectomy Visit Complexity (formerly GPC1X) - 09/05/2023     GU PMH: Renal  calculus - 10/02/2023, - 09/10/2023, - 09/05/2023 Mixed incontinence - 09/05/2023, - 09/01/2022, - 11/11/2021, - 2023, - 2023, - 2022, - 2022 Nocturia - 09/05/2023, - 09/01/2022, - 11/11/2021, - 2023, - 2023, - 2022, - 2022 Urinary Urgency - 09/05/2023, - 09/01/2022, - 11/11/2021, - 2023, - 2023, - 2022, - 2022    NON-GU PMH: Muscle weakness (generalized) - 2022 Other muscle spasm - 2022 Other specified disorders of muscle - 2022 Arthritis Hypertension    FAMILY HISTORY: 2 sons - Runs in Family 3 daughters - Runs in Family Lung Cancer - Runs in Family Uterine Cancer - Runs in Family   SOCIAL HISTORY: Marital Status: Unknown Preferred Language: English; Ethnicity: Not Hispanic Or Latino; Race: White Current Smoking Status: Patient has never smoked.   Tobacco Use Assessment Completed: Used Tobacco in last 30 days? Has never drank.  Does not drink caffeine. Patient's occupation Architect.    REVIEW OF SYSTEMS:    GU Review Female:   Patient denies frequent urination, hard to postpone urination, burning /pain with urination, get up at night to urinate, leakage of urine, stream starts and stops, trouble starting your stream, have to strain to urinate, and being pregnant.  Gastrointestinal (Upper):   Patient denies nausea, vomiting, and indigestion/ heartburn.  Gastrointestinal (Lower):   Patient denies diarrhea and constipation.  Constitutional:   Patient denies fever, night sweats, weight loss, and fatigue.  Skin:   Patient denies skin rash/ lesion and itching.  Eyes:   Patient denies blurred vision and double vision.  Ears/ Nose/ Throat:   Patient denies sore throat and sinus problems.  Hematologic/Lymphatic:   Patient denies swollen glands and easy bruising.  Cardiovascular:   Patient denies leg swelling and chest pains.  Respiratory:   Patient denies cough and shortness of breath.  Endocrine:   Patient denies excessive thirst.  Musculoskeletal:   Patient denies back pain and joint pain.   Neurological:   Patient denies headaches and dizziness.  Psychologic:   Patient denies depression and anxiety.   VITAL SIGNS:      10/25/2023 04:19 PM  BP 106/73 mmHg  Pulse 76 /min   MULTI-SYSTEM PHYSICAL EXAMINATION:    Constitutional: Obese. No physical deformities. Normally developed. Good grooming.   Respiratory: Normal breath sounds. No labored breathing, no use of accessory muscles.   Cardiovascular: Regular rate and rhythm. No murmur, no gallop. Normal temperature, normal extremity pulses, no swelling, no varicosities.      Complexity of Data:  Source Of History:  Patient  Records Review:   Previous Doctor Records, Previous Patient Records, POC Tool  Urine Test Review:   Urinalysis  X-Ray Review: C.T. Abdomen/Pelvis: Reviewed Films. Discussed With Patient.     PROCEDURES:          Visit Complexity - G2211    ASSESSMENT:      ICD-10 Details  1 GU:   Renal calculus - N20.0    PLAN:           Document Letter(s):  Created  for Patient: Clinical Summary         Notes:   The patient has a large branched staghorn stone aysmptomatic in her right kidney. She has thin calyces and a large skin to stone distance. She has limited moblitiy but no history of DM or heart disease.   I spoke with her about treamtent options - including PCNL. I went through the surgery with her and we discussed the risks/benefits. She understands that her habitus and renal anatomy will make this a more challenging access/surgery. As a result she understands that possibility of having to stage her surgery and the potential for second look. I also discussed with her the expected hospital course and potential complications. She understands that she will have a stent that will need to be removed 2 weeks after surgery.   Having gone through all this, she would like to proceed. I will reach out to her PCP for clearance given her limited mobility and morbid obesity.

## 2023-12-26 NOTE — Op Note (Addendum)
 Pre-operative diagnosis: right renal pelvis stones, 3.5cm Post-operative diagnosis: as above   Procedure performed: cystoscopy, right retrograde pyelogram with interpretation, right ureteroscopy, laser lithotripsy, right percutaneous renal access, right nephrolithotomy, right nephrostogram, right ureteral stent placement   Surgeon: Dr. Morene MICAEL Salines, MD Assistant: Lyle Civil, MD  Anesthesia: General  Complications: None  Specimens: The majority of the stones were removed and will be sent to the [facility] for further analysis.  Findings: 1.  Very difficult renal access.  Ultimately, I was able to perform ureteroscopy and laser the stones in the lower pole allowing some room to pass a wire percutaneously. 2.  Ureteral access was obtained with ureteroscopic guidance. 3.  The right retrograde pyelogram demonstrated a normal caliber ureter with no filling defects or abnormalities.  There was a large cast in the left kidney/collecting system and contrast filled in and around it with a large filling defect. 4.  Ultimately, we are able to access the lower pole.  At the end of the case no nephrostomy tube was left as the bleeding appeared to be fairly well-controlled. 5.  The patient appeared to be grossly stone free, but will need a CT scan for verification.  EBL: Approximately 250 cc  Specimens: stone from collecting system - taken to [facility]  Indication: Anita Booker is a 63 y.o. patient with a history of nephrolithiasis with a large stone burden in the right kidney. After reviewing the management options for treatment, he elected to proceed with the above surgical procedure(s). We have discussed the potential benefits and risks of the procedure, side effects of the proposed treatment, the likelihood of the patient achieving the goals of the procedure, and any potential problems that might occur during the procedure or recuperation. Informed  consent has been obtained.   Description:  Consent was obtained in the preoperative holding area. The patient was marked appropriately and then taken back to the operating room where he was intubated on the gurney. The patient was flipped prone onto the split leg OR table. Large jelly rolls were placed in the anterior axillary line on both sides allowing the patient's chest and abdomen to fall inbetween. The patient was then prepped and draped in the routine sterile fashion in the left flank and genital area. A timeout was then held confirming the proper side and procedure as well as antibiotics were administered.  I used the flexible cystoscope and passed gently into the patient's urethra under visual guidance. I then passed a wire into the right ureteral orifice and into the right collecting system. I then advanced a 5 French open-ended ureteral Pollock catheter. The Pollack catheter was then advanced up to the UPJ. The wire was then removed and a retrograde pyelogram was performed with the above findings. I then turned my attention to the patient's right flank and obtaining percutaneous renal access.  Using the C-arm rotated at 30 and the bulls-eye technique with an 18-gauge coaxial needle the  lower posterior lateral calyx was targeted. Then rotating the C-arm AP depth of our needle was noted to be within the calyx and the inner part of the coaxial needle was removed.   This proved to be very difficult, we were unable to get the wire past the stones.  As a result, I exchanged the Pollick catheter for open-ended catheter and ureter for a sensor wire and then advanced a dual-lumen catheter over the wire.  I then passed a second wire into the collecting system in  a retrograde fashion.  I then advanced a 11/13 French ureteral access sheath over the second wire and into the proximal ureter under fluoroscopic guidance.  I subsequently passed the flexible ureteroscope through the access sheath and into the  right kidney.  Using a 200 m laser fiber I fragmented the stone into pieces allowing me to navigate to the lower pole calyx.  Once I was able to get to the appropriate calyx we subsequently turned our attention back to the right flank.  Again using a bull's-eye technique and 30 degree rotation targeted the ureteroscope tip were able to follow the tip under visual guidance.  Once it was in the appropriate location remove the portion of the access needle and advanced a sensor wire through it.  Then grasped the sensor wire with a 0 tip basket and pulled it down through the sheath and up the urethra.  An angiographic catheter was then advanced into the bladder and the wire removed.  A Super Stiff wire was then passed into the angiographic catheter and the angiographic catheter removed.  A dual-lumen catheter was then advanced over the wire and a second Super Stiff wire placed into the patient's bladder under fluoroscopic guidance, establishing 2 superstiff wires through the targeted calyx and into the bladder.   The 30 French NephroMax balloon was then passed over one of the Super Stiff wires and the tip guided down into the targeted calyx. The balloon was then inflated to approximately 12 atm, and once there was no waist noted under fluoroscopy the access sheath was advanced over the balloon. The balloon was then removed. The wires were then placed back into the sheaths and snapped to the drape.   Using the rigid nephroscope to explore the targeted calyx and kidney.  The stone was encountered in the renal pelvis and treated using the Triology/lithoclast device.  Then using a flexible cystoscope to navigate the remaining calyces of the kidney and several stone fragments were basketed and removed.. Contrast was injected through the cystoscope and the calyces systematically inspected under fluoroscopic guidance to ensure that all stone fragments had been removed.  I then passed a sensor wire through the cystoscope  down into the bladder with no guidance.  The sensor wire was then backloaded over the rigid nephroscope using the stent pusher and a 24 cm x 6 french double-J ureteral stent was passed antegrade over the sensor wire down into the bladder under fluoroscopic guidance. Once the stent was in the bladder the wire was gently pulled back and a nice curl noted in the bladder. The wire completely removed from the stent, and nice curl on the proximal end of the stent was noted in the renal pelvis. The sheath was then backed out slowly to ensure that all calyces had been inspected and there was nothing behind the sheath.   A 49F council tip catheter was then passed over one of the Super Stiff wires through the sheath and into the renal pelvis. The sheath was then backed out of the kidney and cut over the red rubber catheter. A nephrostogram was then performed confirming the position of our nephrostomy tube and reassuring that there were no longer any filling defects from the patient's symptoms.  After several minutes of direct pressure and observation was noted that there was no significant bleeding from the nephrostomy tube or around the nephrostomy tube tract. As such, I remove the nephrostomy tube as well as the safety wire. 25 cc of local anesthesia was then  injected into the patient's wound, and the wound was closed with 3-0 nylon in 2 vertical mattress sutures. The incision was then padded using a bundle of 4 x 4's and Hypafix tape. Patient was subsequently rolled over to the supine position and extubated. The patient was returned to the PACU in excellent condition. At the end of the case all lap and needle and sponges were accounted for. There are no perioperative complications.

## 2023-12-26 NOTE — Anesthesia Procedure Notes (Signed)
 Procedure Name: Intubation Date/Time: 12/26/2023 8:40 AM  Performed by: Belvie Valri NOVAK, CRNAPre-anesthesia Checklist: Patient identified, Emergency Drugs available, Suction available and Patient being monitored Patient Re-evaluated:Patient Re-evaluated prior to induction Oxygen Delivery Method: Circle System Utilized Preoxygenation: Pre-oxygenation with 100% oxygen Induction Type: IV induction Ventilation: Mask ventilation without difficulty and Oral airway inserted - appropriate to patient size Laryngoscope Size: Glidescope and 3 Grade View: Grade I Tube type: Oral Tube size: 7.0 mm Number of attempts: 1 Airway Equipment and Method: Stylet and Oral airway Placement Confirmation: ETT inserted through vocal cords under direct vision, positive ETCO2 and breath sounds checked- equal and bilateral Secured at: 21 cm Tube secured with: Tape Dental Injury: Teeth and Oropharynx as per pre-operative assessment  Comments: Poor view with DL x1 with MAC 3, successful intubation with glidescope x1

## 2023-12-26 NOTE — Discharge Instructions (Signed)
Discharge instructions following PCNL  Call your doctor for: Fevers greater than 100.5 Severe nausea or vomiting Increasing pain not controlled by pain medication Increasing redness or drainage from incisions Decreased urine output or Empie catheter is no longer draining  The number for questions is [phone].  Activity: Gradually increase activity with short frequent walks, 3-4 times [place] day.  Avoid strenuous activities, like sports, lawn-mowing, or heavy lifting (more than 10-15 pounds).  Wear loose, comfortable clothing that pull or kink the tube or tubes.  Do not drive while taking pain medication, or until your doctor permitts it.  Bathing and dressing changes: You should not shower for 48 hours after surgery.  Do not soak your back in [place] bathtub.  Diet: It is extremely important to drink plenty of fluids after surgery, especially water.  You may resume your regular diet, unless otherwise instructed.  Medications: May take Tylenol (acetaminophen) or ibuprofen (Advil, Motrin) as directed over-the-counter. Take any prescriptions as directed.  Follow-up appointments: Follow-up appointment will be scheduled with Dr. Brehanna Deveny in 10-14 days for hospital check and stent removal.  

## 2023-12-26 NOTE — Transfer of Care (Signed)
 Immediate Anesthesia Transfer of Care Note  Patient: Anita Booker  Procedure(s) Performed: NEPHROLITHOTOMY (Right)  Patient Location: PACU  Anesthesia Type:General  Level of Consciousness: awake  Airway & Oxygen Therapy: Patient Spontanous Breathing and Patient connected to nasal cannula oxygen  Post-op Assessment: Report given to RN and Post -op Vital signs reviewed and stable  Post vital signs: Reviewed and stable  Last Vitals:  Vitals Value Taken Time  BP 110/91 12/26/23 13:01  Temp    Pulse 64 12/26/23 13:05  Resp 14 12/26/23 13:05  SpO2 100 % 12/26/23 13:05  Vitals shown include unfiled device data.  Last Pain:  Vitals:   12/26/23 0647  TempSrc: Oral  PainSc: 0-No pain         Complications: No notable events documented. Slight redness on chin noted after pt turned from prone position. Blanchable, improving upon arrival to PACU

## 2023-12-26 NOTE — Progress Notes (Signed)
 Received notification from pharmacy that patient's home medicine phentermine  is not stocked as part of hospital formulary.  Informed patient of the above and discussed process if patient has a family member who would be able to bring medication to the hospital.  Patient stated she did not feel it was necessary for her medication to be brought from home and is aware it is not available from hospital pharmacy to be administered.   RN and patient discussed that if patient's stay is longer than she anticipates and/or if a family member brings her home medication that patient would need to notify RN so that hospital process for home medication dispensing can be implemented.  Patient verbalized understanding.

## 2023-12-26 NOTE — Anesthesia Postprocedure Evaluation (Signed)
 Anesthesia Post Note  Patient: Anita Booker  Procedure(s) Performed: NEPHROLITHOTOMY (Right)     Patient location during evaluation: PACU Anesthesia Type: General Level of consciousness: awake and alert Pain management: pain level controlled Vital Signs Assessment: post-procedure vital signs reviewed and stable Respiratory status: spontaneous breathing, nonlabored ventilation, respiratory function stable and patient connected to nasal cannula oxygen Cardiovascular status: blood pressure returned to baseline and stable Postop Assessment: no apparent nausea or vomiting Anesthetic complications: no   No notable events documented.  Last Vitals:  Vitals:   12/26/23 1445 12/26/23 1500  BP: 97/74 100/61  Pulse: 71 65  Resp: 11 10  Temp:    SpO2: 93% 100%    Last Pain:  Vitals:   12/26/23 1500  TempSrc:   PainSc: Asleep                 Eretria Manternach

## 2023-12-26 NOTE — Interval H&P Note (Signed)
 History and Physical Interval Note:  12/26/2023 8:22 AM  Anita Booker  has presented today for surgery, with the diagnosis of RIGHT STAGHORN CALCULI.  The various methods of treatment have been discussed with the patient and family. After consideration of risks, benefits and other options for treatment, the patient has consented to  Procedure(s) with comments: NEPHROLITHOTOMY (Right) - RIGHT PERCUTANEOUS NEPHROLITHOTOMY as a surgical intervention.  The patient's history has been reviewed, patient examined, no change in status, stable for surgery.  I have reviewed the patient's chart and labs.  Questions were answered to the patient's satisfaction.     Morene LELON Salines

## 2023-12-27 ENCOUNTER — Other Ambulatory Visit: Payer: Self-pay

## 2023-12-27 ENCOUNTER — Encounter (HOSPITAL_COMMUNITY): Payer: Self-pay | Admitting: Urology

## 2023-12-27 ENCOUNTER — Other Ambulatory Visit (HOSPITAL_COMMUNITY): Payer: Self-pay

## 2023-12-27 ENCOUNTER — Inpatient Hospital Stay (HOSPITAL_COMMUNITY)

## 2023-12-27 DIAGNOSIS — R188 Other ascites: Secondary | ICD-10-CM | POA: Diagnosis not present

## 2023-12-27 DIAGNOSIS — N2889 Other specified disorders of kidney and ureter: Secondary | ICD-10-CM | POA: Diagnosis not present

## 2023-12-27 DIAGNOSIS — N1330 Unspecified hydronephrosis: Secondary | ICD-10-CM | POA: Diagnosis not present

## 2023-12-27 LAB — BASIC METABOLIC PANEL WITH GFR
Anion gap: 8 (ref 5–15)
BUN: 24 mg/dL — ABNORMAL HIGH (ref 8–23)
CO2: 22 mmol/L (ref 22–32)
Calcium: 8.9 mg/dL (ref 8.9–10.3)
Chloride: 102 mmol/L (ref 98–111)
Creatinine, Ser: 1.24 mg/dL — ABNORMAL HIGH (ref 0.44–1.00)
GFR, Estimated: 48 mL/min — ABNORMAL LOW (ref >60)
Glucose, Bld: 105 mg/dL — ABNORMAL HIGH (ref 70–99)
Potassium: 4.5 mmol/L (ref 3.5–5.1)
Sodium: 132 mmol/L — ABNORMAL LOW (ref 135–145)

## 2023-12-27 LAB — CBC
HCT: 31.3 % — ABNORMAL LOW (ref 36.0–46.0)
Hemoglobin: 9.9 g/dL — ABNORMAL LOW (ref 12.0–15.0)
MCH: 29.7 pg (ref 26.0–34.0)
MCHC: 31.6 g/dL (ref 30.0–36.0)
MCV: 94 fL (ref 80.0–100.0)
Platelets: 213 K/uL (ref 150–400)
RBC: 3.33 MIL/uL — ABNORMAL LOW (ref 3.87–5.11)
RDW: 12.6 % (ref 11.5–15.5)
WBC: 15.8 K/uL — ABNORMAL HIGH (ref 4.0–10.5)
nRBC: 0 % (ref 0.0–0.2)

## 2023-12-27 LAB — HEMOGLOBIN AND HEMATOCRIT, BLOOD
HCT: 27.9 % — ABNORMAL LOW (ref 36.0–46.0)
Hemoglobin: 8.8 g/dL — ABNORMAL LOW (ref 12.0–15.0)

## 2023-12-27 LAB — GLUCOSE, CAPILLARY
Glucose-Capillary: 92 mg/dL (ref 70–99)
Glucose-Capillary: 94 mg/dL (ref 70–99)
Glucose-Capillary: 102 mg/dL — ABNORMAL HIGH (ref 70–99)
Glucose-Capillary: 97 mg/dL (ref 70–99)

## 2023-12-27 MED ORDER — TRAMADOL HCL 50 MG PO TABS
50.0000 mg | ORAL_TABLET | Freq: Four times a day (QID) | ORAL | 0 refills | Status: DC | PRN
  Filled 2023-12-27 (×2): qty 15, 2d supply, fill #0

## 2023-12-27 MED ORDER — ACETAMINOPHEN 325 MG PO TABS: 650.0000 mg | ORAL_TABLET | Freq: Four times a day (QID) | ORAL | Status: DC | PRN

## 2023-12-27 MED ORDER — SODIUM CHLORIDE 0.9 % IV BOLUS
250.0000 mL | Freq: Once | INTRAVENOUS | Status: AC
  Administered 2023-12-27: 250 mL via INTRAVENOUS

## 2023-12-27 MED ORDER — SODIUM CHLORIDE 0.9 % IV SOLN
2.0000 g | INTRAVENOUS | Status: DC
  Administered 2023-12-27: 2 g via INTRAVENOUS
  Filled 2023-12-27: qty 20

## 2023-12-27 MED ORDER — ACETAMINOPHEN 500 MG PO TABS
1000.0000 mg | ORAL_TABLET | Freq: Four times a day (QID) | ORAL | 2 refills | Status: AC | PRN
  Filled 2023-12-27 (×2): qty 100, 13d supply, fill #0

## 2023-12-27 MED ORDER — CEFPODOXIME PROXETIL 200 MG PO TABS
200.0000 mg | ORAL_TABLET | Freq: Two times a day (BID) | ORAL | 0 refills | Status: DC
  Filled 2023-12-27: qty 20, 10d supply, fill #0

## 2023-12-27 MED ORDER — LEVOFLOXACIN 750 MG PO TABS
750.0000 mg | ORAL_TABLET | Freq: Every day | ORAL | 0 refills | Status: DC
  Filled 2023-12-27 (×2): qty 5, 5d supply, fill #0

## 2023-12-27 NOTE — Plan of Care (Signed)

## 2023-12-27 NOTE — Discharge Summary (Addendum)
 Date of admission: 12/26/2023  Date of discharge: 12/27/2023  Admission diagnosis: Kidney stone  Discharge diagnosis: Kidney stone  Secondary diagnoses:  Diabetes  Hypertension  Heart failure  Obesity  Chronic pain  History and Physical: For full details, please see admission history and physical. Briefly, Anita Booker is a 63 y.o. year old patient with large right sided stone burden.   Hospital Course: Patient underwent right PCNL with surgeon access on 12/26/23. She tolerated the procedure well and was taken to the floor for recovery.   On POD1, she remained stable with pain well controlled. Foley catheter was removed and she voided spontaneously. CT scan was obtained that demonstrated small residual stone in upper pole. She was discharged with right ureteral stent in place.   Laboratory values:  Recent Labs    12/26/23 1327 12/27/23 0350  HGB 11.2* 9.9*  HCT 36.8 31.3*   Recent Labs    12/26/23 1327 12/27/23 0350  CREATININE 1.20* 1.24*    Disposition: Home  Discharge instruction: The patient was instructed to be ambulatory but told to refrain from heavy lifting, strenuous activity, or driving.   Discharge medications:  Allergies as of 12/27/2023       Reactions   Buprenorphine  Rash   Butran's Patch causes severe skin rash   Augmentin [amoxicillin-pot Clavulanate] Diarrhea, Other (See Comments)   Codeine  Nausea Only   Other Reaction(s): GI Intolerance, vomiting        Medication List     PAUSE taking these medications    aspirin  EC 81 MG tablet Wait to take this until: December 29, 2023 Take 81 mg by mouth daily.       TAKE these medications    acetaminophen  500 MG tablet Commonly known as: TYLENOL  Take 2 tablets (1,000 mg total) by mouth every 6 (six) hours as needed.   albuterol  108 (90 Base) MCG/ACT inhaler Commonly known as: VENTOLIN  HFA Inhale 2 puffs into the lungs every 6 (six) hours as needed for wheezing.   atorvastatin  40  MG tablet Commonly known as: Lipitor Take 1 tablet (40 mg total) by mouth daily.   CALCIUM  600 PO Take 1 tablet by mouth daily.   celecoxib  200 MG capsule Commonly known as: CELEBREX  Take 1 capsule (200 mg total) by mouth daily with food.   cholecalciferol 1000 units tablet Commonly known as: VITAMIN D Take 1,000 Units by mouth daily.   clobetasol  0.05 % external solution Commonly known as: TEMOVATE  Apply 10-15 drops topically every 6 (six) hours as needed.   furosemide  20 MG tablet Commonly known as: Lasix  Take 1 tablet (20 mg total) by mouth daily as needed.   hydrOXYzine  25 MG tablet Commonly known as: ATARAX  Take 1 tablet (25 mg total) by mouth every 8 (eight) hours as needed.   levofloxacin 750 MG tablet Commonly known as: Levaquin Take 1 tablet (750 mg total) by mouth daily for 5 days.   multivitamin with minerals tablet Take 1 tablet by mouth daily.   nitroGLYCERIN  0.4 MG SL tablet Commonly known as: NITROSTAT  Place 0.4 mg under the tongue every 5 (five) minutes as needed for chest pain.   nortriptyline  50 MG capsule Commonly known as: PAMELOR  Take 1 capsule (50 mg total) by mouth at bedtime.   nortriptyline  50 MG capsule Commonly known as: PAMELOR  Take 2 capsules (100 mg total) by mouth at bedtime.   Oxycodone  HCl 10 MG Tabs Take 1 tablet (10 mg total) by mouth 4 (four) times daily as needed.  phentermine  37.5 MG tablet Commonly known as: ADIPEX-P  Take 1 tablet (37.5 mg total) by mouth in the morning AND 0.5 tablets (18.75 mg total) daily at noon. orally   spironolactone  100 MG tablet Commonly known as: ALDACTONE  Take 1 tablet (100 mg total) by mouth daily.   traMADol  50 MG tablet Commonly known as: Ultram  Take 1-2 tablets (50-100 mg total) by mouth every 6 (six) hours as needed for moderate pain (pain score 4-6).   tretinoin  0.1 % cream Commonly known as: RETIN-A  Apply a pea size amount to face at bedtime   Trospium  Chloride 60 MG Cp24 Take  1 capsule (60 mg total) by mouth daily.   valsartan  320 MG tablet Commonly known as: DIOVAN  Take 0.5 tablets (160 mg total) by mouth daily.   Wegovy  0.5 MG/0.5ML Soaj SQ injection Generic drug: semaglutide -weight management Inject 0.5 mg into the skin once a week.   Wegovy  1 MG/0.5ML Soaj SQ injection Generic drug: semaglutide -weight management Inject 1 mg into the skin once a week.   Zepbound  5 MG/0.5ML Pen Generic drug: tirzepatide  Inject 5 mg into the skin every 7 (seven) days.   zolpidem  10 MG tablet Commonly known as: AMBIEN  Take 1 tablet (10 mg total) by mouth at bedtime as needed.         Followup:   Follow-up Information     Cam Morene ORN, MD Follow up on 01/07/2024.   Specialty: Urology Why: 2:30pm Contact information: [address] [place] [address] [phone]

## 2023-12-27 NOTE — Progress Notes (Signed)
 Post-op note S/p R PCNL with surgeon access.   Subjective: The patient is doing well.  Endorses knee pain but no flank pain. Incision remains dry, no dressing changes. UOP 800ccs. Encouraged PO intake. CT with small stone in upper pole, otherwise clear.   Objective: Vital signs in last 24 hours: Temp:  [96.4 F (35.8 C)-99.9 F (37.7 C)] 98.5 F (36.9 C) (10/09 0551) Pulse Rate:  [62-92] 78 (10/09 0551) Resp:  [8-23] 18 (10/09 0551) BP: (72-126)/(34-91) 76/47 (10/09 0551) SpO2:  [92 %-100 %] 95 % (10/09 0551)  Intake/Output from previous day: 10/08 0701 - 10/09 0700 In: 2217.7 [P.O.:240; I.V.:1677.7; IV Piggyback:300] Out: 1050 [Urine:800; Blood:250] Intake/Output this shift: Total I/O In: 340 [I.V.:240; IV Piggyback:100] Out: 400 [Urine:400]  Physical Exam:  General: Alert and oriented. Abdomen: Soft, Nondistended. Incisions: Clean and dry. GU: Foley catheter in place.   Lab Results: Recent Labs    12/26/23 1327 12/27/23 0350  HGB 11.2* 9.9*  HCT 36.8 31.3*    Assessment/Plan: POD#1 -Trial of void -PO pain medications -Medlock -Anticipate discharge later today is remains stable.    LOS: 1 day   Anita Booker 12/27/2023, 6:56 AM

## 2023-12-27 NOTE — Treatment Plan (Signed)
 Blood pressure continues to be low. Vitals otherwise normal and patient remains asx. Hgb 8.8 (down from 9.9 this AM). Cultures +Proteus. On antibiotics. Will continue to monitor overnight. Plan to recheck CBC in AM unless clinically worsens. Anita  Lyle GEANNIE Civil, MD Resident, PGY4 Department of Urology

## 2023-12-27 NOTE — Progress Notes (Signed)
 Lyle Spratte made aware of patient's latest blood pressures. Orders for a bolus and recheck H&H ordered. Pt is currently asymptomatic.

## 2023-12-28 ENCOUNTER — Other Ambulatory Visit (HOSPITAL_COMMUNITY): Payer: Self-pay

## 2023-12-28 LAB — BASIC METABOLIC PANEL WITH GFR
Anion gap: 8 (ref 5–15)
BUN: 27 mg/dL — ABNORMAL HIGH (ref 8–23)
CO2: 24 mmol/L (ref 22–32)
Calcium: 9.2 mg/dL (ref 8.9–10.3)
Chloride: 104 mmol/L (ref 98–111)
Creatinine, Ser: 1.27 mg/dL — ABNORMAL HIGH (ref 0.44–1.00)
GFR, Estimated: 47 mL/min — ABNORMAL LOW (ref >60)
Glucose, Bld: 96 mg/dL (ref 70–99)
Potassium: 4.5 mmol/L (ref 3.5–5.1)
Sodium: 135 mmol/L (ref 135–145)

## 2023-12-28 LAB — CBC
HCT: 32.5 % — ABNORMAL LOW (ref 36.0–46.0)
Hemoglobin: 9.9 g/dL — ABNORMAL LOW (ref 12.0–15.0)
MCH: 29.6 pg (ref 26.0–34.0)
MCHC: 30.5 g/dL (ref 30.0–36.0)
MCV: 97.3 fL (ref 80.0–100.0)
Platelets: 186 K/uL (ref 150–400)
RBC: 3.34 MIL/uL — ABNORMAL LOW (ref 3.87–5.11)
RDW: 12.8 % (ref 11.5–15.5)
WBC: 12 K/uL — ABNORMAL HIGH (ref 4.0–10.5)
nRBC: 0 % (ref 0.0–0.2)

## 2023-12-28 LAB — URINE CULTURE
Culture: 20000 — AB
Culture: 10000 — AB

## 2023-12-28 LAB — GLUCOSE, CAPILLARY
Glucose-Capillary: 101 mg/dL — ABNORMAL HIGH (ref 70–99)
Glucose-Capillary: 87 mg/dL (ref 70–99)

## 2023-12-28 MED ORDER — CEPHALEXIN 500 MG PO CAPS
500.0000 mg | ORAL_CAPSULE | Freq: Three times a day (TID) | ORAL | 0 refills | Status: AC
  Filled 2023-12-28: qty 21, 7d supply, fill #0

## 2023-12-28 NOTE — Discharge Summary (Addendum)
 Date of admission: 12/26/2023  Date of discharge: 12/28/2023  Admission diagnosis: Kidney stone  Discharge diagnosis: Kidney stone  Secondary diagnoses:  Diabetes  Hypertension  Heart failure  Obesity  Chronic pain  History and Physical: For full details, please see admission history and physical. Briefly, Anita Booker is a 63 y.o. year old patient with large right sided stone burden.   Hospital Course: Patient underwent right PCNL with surgeon access on 12/26/23. She tolerated the procedure well and was taken to the floor for recovery.   On POD1, she remained stable with pain well controlled. Foley catheter was removed and she voided spontaneously. CT scan was obtained that demonstrated small residual stone in upper pole. Her blood pressures were soft throughout the day. While she remained symptomatic, in setting of her downtrending Hgb and positive urine culture, she remained in the hospital for an additional day.   On POD2, her blood pressure had normalized. Her Hgb and Cr stabilized. Urine culture was preliminary positive for Proteus, with sensitivites pending. She was discharged on empiric antibiotics with plan to follow-up if antibiotics needed to be changed. She was discharged with right ureteral stent in place.   Laboratory values:  Recent Labs    12/27/23 0350 12/27/23 1047 12/28/23 0357  HGB 9.9* 8.8* 9.9*  HCT 31.3* 27.9* 32.5*   Recent Labs    12/27/23 0350 12/28/23 0357  CREATININE 1.24* 1.27*    Disposition: Home  Discharge instruction: The patient was instructed to be ambulatory but told to refrain from heavy lifting, strenuous activity, or driving.   Discharge medications:  Allergies as of 12/28/2023       Reactions   Buprenorphine  Rash   Butran's Patch causes severe skin rash   Augmentin [amoxicillin-pot Clavulanate] Diarrhea, Other (See Comments)   Codeine  Nausea Only   Other Reaction(s): GI Intolerance, vomiting        Medication  List     PAUSE taking these medications    aspirin  EC 81 MG tablet Wait to take this until: December 29, 2023 Take 81 mg by mouth daily.       TAKE these medications    acetaminophen  500 MG tablet Commonly known as: TYLENOL  Take 2 tablets (1,000 mg total) by mouth every 6 (six) hours as needed.   albuterol  108 (90 Base) MCG/ACT inhaler Commonly known as: VENTOLIN  HFA Inhale 2 puffs into the lungs every 6 (six) hours as needed for wheezing.   atorvastatin  40 MG tablet Commonly known as: Lipitor Take 1 tablet (40 mg total) by mouth daily.   CALCIUM  600 PO Take 1 tablet by mouth daily.   celecoxib  200 MG capsule Commonly known as: CELEBREX  Take 1 capsule (200 mg total) by mouth daily with food.   cephALEXin 500 MG capsule Commonly known as: KEFLEX Take 1 capsule (500 mg total) by mouth 3 (three) times daily.   cholecalciferol 1000 units tablet Commonly known as: VITAMIN D Take 1,000 Units by mouth daily.   clobetasol  0.05 % external solution Commonly known as: TEMOVATE  Apply 10-15 drops topically every 6 (six) hours as needed.   furosemide  20 MG tablet Commonly known as: Lasix  Take 1 tablet (20 mg total) by mouth daily as needed.   hydrOXYzine  25 MG tablet Commonly known as: ATARAX  Take 1 tablet (25 mg total) by mouth every 8 (eight) hours as needed.   multivitamin with minerals tablet Take 1 tablet by mouth daily.   nitroGLYCERIN  0.4 MG SL tablet Commonly known as: NITROSTAT  Place 0.4 mg  under the tongue every 5 (five) minutes as needed for chest pain.   nortriptyline  50 MG capsule Commonly known as: PAMELOR  Take 1 capsule (50 mg total) by mouth at bedtime.   nortriptyline  50 MG capsule Commonly known as: PAMELOR  Take 2 capsules (100 mg total) by mouth at bedtime.   Oxycodone  HCl 10 MG Tabs Take 1 tablet (10 mg total) by mouth 4 (four) times daily as needed.   phentermine  37.5 MG tablet Commonly known as: ADIPEX-P  Take 1 tablet (37.5 mg total) by  mouth in the morning AND 0.5 tablets (18.75 mg total) daily at noon. orally   spironolactone  100 MG tablet Commonly known as: ALDACTONE  Take 1 tablet (100 mg total) by mouth daily.   traMADol  50 MG tablet Commonly known as: Ultram  Take 1-2 tablets (50-100 mg total) by mouth every 6 (six) hours as needed for moderate pain (pain score 4-6).   tretinoin  0.1 % cream Commonly known as: RETIN-A  Apply a pea size amount to face at bedtime   Trospium  Chloride 60 MG Cp24 Take 1 capsule (60 mg total) by mouth daily.   valsartan  320 MG tablet Commonly known as: DIOVAN  Take 0.5 tablets (160 mg total) by mouth daily.   Wegovy  0.5 MG/0.5ML Soaj SQ injection Generic drug: semaglutide -weight management Inject 0.5 mg into the skin once a week.   Wegovy  1 MG/0.5ML Soaj SQ injection Generic drug: semaglutide -weight management Inject 1 mg into the skin once a week.   Zepbound  5 MG/0.5ML Pen Generic drug: tirzepatide  Inject 5 mg into the skin every 7 (seven) days.   zolpidem  10 MG tablet Commonly known as: AMBIEN  Take 1 tablet (10 mg total) by mouth at bedtime as needed.         Followup:   Follow-up Information     Cam Morene ORN, MD Follow up on 01/07/2024.   Specialty: Urology Why: 2:30pm Contact information: [address] [place] [address] [phone]

## 2023-12-28 NOTE — Progress Notes (Signed)
 Post-op note S/p R PCNL with surgeon access.   Subjective: Patient stayed for an additional night due to low blood pressures. Remained asx.  This AM, her BP has normalized and labs are stable. Mild TPP along flank, otherwise pain well controlled. Appropriate Uop - voiding spontaneously.   Objective: Vital signs in last 24 hours: Temp:  [98.2 F (36.8 C)-99 F (37.2 C)] 98.6 F (37 C) (10/10 0557) Pulse Rate:  [67-91] 91 (10/10 0557) Resp:  [14-20] 19 (10/10 0557) BP: (75-110)/(40-57) 110/56 (10/10 0557) SpO2:  [94 %-98 %] 94 % (10/10 0557)  Intake/Output from previous day: 10/09 0701 - 10/10 0700 In: 938.3 [P.O.:840; IV Piggyback:98.3] Out: 1050 [Urine:1050] Intake/Output this shift: Total I/O In: 338.3 [P.O.:240; IV Piggyback:98.3] Out: 350 [Urine:350]  Physical Exam:  General: Alert and oriented. Abdomen: Soft, Nondistended. Incisions: Clean and dry. GU: Voiding spontaneously   Lab Results: Recent Labs    12/27/23 0350 12/27/23 1047 12/28/23 0357  HGB 9.9* 8.8* 9.9*  HCT 31.3* 27.9* 32.5*    Assessment/Plan: POD#2 -PO pain medications -Medlock -Anticipate discharge later today is remains stable.    LOS: 2 days   Lyle LOISE Civil 12/28/2023, 6:44 AM

## 2023-12-28 NOTE — Progress Notes (Signed)
 Discharge medications delivered to patient at bedside in a secure bag.

## 2024-01-03 ENCOUNTER — Other Ambulatory Visit: Payer: Self-pay

## 2024-01-04 ENCOUNTER — Other Ambulatory Visit (HOSPITAL_COMMUNITY): Payer: Self-pay

## 2024-01-04 DIAGNOSIS — B370 Candidal stomatitis: Secondary | ICD-10-CM | POA: Diagnosis not present

## 2024-01-04 DIAGNOSIS — K645 Perianal venous thrombosis: Secondary | ICD-10-CM | POA: Diagnosis not present

## 2024-01-04 MED ORDER — NYSTATIN 100000 UNIT/ML MT SUSP
OROMUCOSAL | 0 refills | Status: AC
  Filled 2024-01-04: qty 500, 7d supply, fill #0
  Filled 2024-02-25: qty 500, 6d supply, fill #0
  Filled 2024-04-02: qty 500, 7d supply, fill #0

## 2024-01-07 ENCOUNTER — Other Ambulatory Visit (HOSPITAL_COMMUNITY): Payer: Self-pay

## 2024-01-07 ENCOUNTER — Telehealth: Payer: Self-pay

## 2024-01-07 DIAGNOSIS — R399 Unspecified symptoms and signs involving the genitourinary system: Secondary | ICD-10-CM | POA: Diagnosis not present

## 2024-01-07 DIAGNOSIS — N200 Calculus of kidney: Secondary | ICD-10-CM | POA: Diagnosis not present

## 2024-01-07 NOTE — Telephone Encounter (Signed)
 Patient called about questions about her medication since she had surgery.  I called patient back, no answer, lvm

## 2024-01-08 LAB — STONE ANALYSIS
Calcium Phosphate (Carbonate): 10 %
Struvite (MgNH4PO4 6H2O): 90 %
Weight Calculi: 2619 mg

## 2024-01-10 ENCOUNTER — Other Ambulatory Visit (HOSPITAL_COMMUNITY): Payer: Self-pay

## 2024-01-10 ENCOUNTER — Other Ambulatory Visit: Payer: Self-pay

## 2024-01-10 MED ORDER — AMOXICILLIN 500 MG PO CAPS
ORAL_CAPSULE | ORAL | 1 refills | Status: AC
  Filled 2024-01-10: qty 30, 10d supply, fill #0
  Filled 2024-01-17 – 2024-01-25 (×2): qty 30, 10d supply, fill #1

## 2024-01-10 MED ORDER — NYSTATIN 100000 UNIT/ML MT SUSP
OROMUCOSAL | 1 refills | Status: AC
  Filled 2024-01-10: qty 30, 1d supply, fill #0
  Filled 2024-01-22: qty 30, 1d supply, fill #1

## 2024-01-17 ENCOUNTER — Other Ambulatory Visit: Payer: Self-pay

## 2024-01-22 ENCOUNTER — Other Ambulatory Visit: Payer: Self-pay

## 2024-01-22 ENCOUNTER — Other Ambulatory Visit (HOSPITAL_COMMUNITY): Payer: Self-pay

## 2024-01-24 ENCOUNTER — Other Ambulatory Visit (HOSPITAL_COMMUNITY): Payer: Self-pay

## 2024-01-24 ENCOUNTER — Other Ambulatory Visit: Payer: Self-pay

## 2024-01-24 ENCOUNTER — Telehealth: Payer: Self-pay | Admitting: Registered Nurse

## 2024-01-24 DIAGNOSIS — M170 Bilateral primary osteoarthritis of knee: Secondary | ICD-10-CM

## 2024-01-24 DIAGNOSIS — Z6841 Body Mass Index (BMI) 40.0 and over, adult: Secondary | ICD-10-CM | POA: Diagnosis not present

## 2024-01-24 DIAGNOSIS — K760 Fatty (change of) liver, not elsewhere classified: Secondary | ICD-10-CM | POA: Diagnosis not present

## 2024-01-24 DIAGNOSIS — I10 Essential (primary) hypertension: Secondary | ICD-10-CM | POA: Diagnosis not present

## 2024-01-24 DIAGNOSIS — R632 Polyphagia: Secondary | ICD-10-CM | POA: Diagnosis not present

## 2024-01-24 DIAGNOSIS — E66813 Obesity, class 3: Secondary | ICD-10-CM | POA: Diagnosis not present

## 2024-01-24 DIAGNOSIS — G4733 Obstructive sleep apnea (adult) (pediatric): Secondary | ICD-10-CM | POA: Diagnosis not present

## 2024-01-24 DIAGNOSIS — G894 Chronic pain syndrome: Secondary | ICD-10-CM

## 2024-01-24 MED ORDER — PHENTERMINE HCL 37.5 MG PO TABS
ORAL_TABLET | ORAL | 0 refills | Status: DC
  Filled 2024-01-24: qty 45, 30d supply, fill #0

## 2024-01-24 MED ORDER — OXYCODONE HCL 10 MG PO TABS
10.0000 mg | ORAL_TABLET | Freq: Four times a day (QID) | ORAL | 0 refills | Status: AC | PRN
  Filled 2024-01-24: qty 120, 30d supply, fill #0

## 2024-01-24 NOTE — Telephone Encounter (Signed)
 PMP was Reviewd Oxycodone  e-scribed to pharmacy.  Anita Booker was called and verbalizes understanding.

## 2024-01-25 ENCOUNTER — Other Ambulatory Visit: Payer: Self-pay

## 2024-01-25 ENCOUNTER — Other Ambulatory Visit (HOSPITAL_BASED_OUTPATIENT_CLINIC_OR_DEPARTMENT_OTHER): Payer: Self-pay

## 2024-01-25 ENCOUNTER — Other Ambulatory Visit (HOSPITAL_COMMUNITY): Payer: Self-pay

## 2024-01-25 MED ORDER — HYDROXYZINE HCL 25 MG PO TABS
25.0000 mg | ORAL_TABLET | Freq: Three times a day (TID) | ORAL | 3 refills | Status: AC | PRN
  Filled 2024-01-25: qty 90, 30d supply, fill #0
  Filled 2024-02-25 – 2024-03-02 (×2): qty 90, 30d supply, fill #1

## 2024-01-26 ENCOUNTER — Other Ambulatory Visit (HOSPITAL_COMMUNITY): Payer: Self-pay

## 2024-01-28 ENCOUNTER — Other Ambulatory Visit: Payer: Self-pay

## 2024-01-29 ENCOUNTER — Other Ambulatory Visit: Payer: Self-pay

## 2024-01-31 ENCOUNTER — Encounter: Payer: Self-pay | Admitting: Registered Nurse

## 2024-01-31 ENCOUNTER — Encounter: Attending: Registered Nurse | Admitting: Registered Nurse

## 2024-01-31 VITALS — BP 103/68 | HR 74 | Ht 61.0 in | Wt 240.0 lb

## 2024-01-31 DIAGNOSIS — G894 Chronic pain syndrome: Secondary | ICD-10-CM | POA: Insufficient documentation

## 2024-01-31 DIAGNOSIS — Z79891 Long term (current) use of opiate analgesic: Secondary | ICD-10-CM | POA: Insufficient documentation

## 2024-01-31 DIAGNOSIS — Z5181 Encounter for therapeutic drug level monitoring: Secondary | ICD-10-CM | POA: Diagnosis not present

## 2024-01-31 DIAGNOSIS — M7062 Trochanteric bursitis, left hip: Secondary | ICD-10-CM | POA: Diagnosis not present

## 2024-01-31 DIAGNOSIS — M170 Bilateral primary osteoarthritis of knee: Secondary | ICD-10-CM | POA: Insufficient documentation

## 2024-01-31 DIAGNOSIS — M7061 Trochanteric bursitis, right hip: Secondary | ICD-10-CM | POA: Insufficient documentation

## 2024-01-31 NOTE — Progress Notes (Signed)
 Subjective:    Patient ID: Anita Booker, female    DOB: 29-Jun-1960, 63 y.o.   MRN: [id number]  HPI: Anita Booker is a 63 y.o. female who returns for follow up appointment for chronic pain and medication refill. She states her pain is located in her bilateral hips and bilateral knee pain. She rates her pain 6. Her current exercise regime is using her sit down elliptical for 10 minutes three times a day and performing stretching exercises.  Anita Booker was admitted to [facility] long for urinary incontinence, she was discharged on 12/28/2023. Discharge Summary was reviewed,   She underwent on 12/26/2023: On Dr Cam NEPHROLITHOTOMY Right General  RIGHT PERCUTANEOUS NEPHROLITHOTOMY        Anita Booker Morphine  equivalent is 60.00 MME.   Oral Swab was Performed today.     Pain Inventory Average Pain 6 Pain Right Now 6 My pain is sharp, burning, dull, stabbing, tingling, and aching  In the last 24 hours, has pain interfered with the following? General activity 7 Relation with others 6 Enjoyment of life 6 What TIME of day is your pain at its worst? evening and night Sleep (in general) Poor  Pain is worse with: walking and standing Pain improves with: rest, heat/ice, therapy/exercise, and medication Relief from Meds: na  Family History  Problem Relation Age of Onset   Cancer Mother    Thyroid  disease Mother    Stroke Mother    Hypertension Mother    Diabetes Mother    Uterine cancer Mother 103   Obesity Mother    Obesity Father    Hypertension Father    Lung cancer Father 52   Cancer Father        lung   Breast cancer Sister 53       identical twin   Healthy Sister    Healthy Brother    Heart attack Brother 48   Cancer Paternal Grandmother        breast   Lung cancer Paternal Uncle        all four uncles had lung cancer and were smokers   Breast cancer Cousin        paternal cousin dx in her 59s   Liver cancer Cousin        paternal  cousin with hepatitis and liver cancer   Breast cancer Other    Social History   Socioeconomic History   Marital status: Married    Spouse name: [name]   Number of children: 5   Years of education: Not on file   Highest education level: Not on file  Occupational History   Occupation: RN  Tobacco Use   Smoking status: Never   Smokeless tobacco: Never  Vaping Use   Vaping status: Never Used  Substance and Sexual Activity   Alcohol use: No   Drug use: No   Sexual activity: Not on file  Other Topics Concern   Not on file  Social History Narrative   Right handed   Social Drivers of Health   Financial Resource Strain: Patient Declined (10/22/2022)   Received from [facility]   Overall Financial Resource Strain (CARDIA)    Difficulty of Paying Living Expenses: Patient declined  Food Insecurity: No Food Insecurity (12/26/2023)   Hunger Vital Sign    Worried About Running Out of Food in the Last Year: Never true    Ran Out of Food in the Last Year: Never true  Transportation Needs: No Transportation  Needs (12/26/2023)   PRAPARE - Administrator, Civil Service (Medical): No    Lack of Transportation (Non-Medical): No  Physical Activity: Unknown (10/22/2022)   Received from [facility]   Exercise Vital Sign    On average, how many days per week do you engage in moderate to strenuous exercise (like a brisk walk)?: Patient declined    On average, how many minutes do you engage in exercise at this level?: 10 min  Stress: Patient Declined (10/22/2022)   Received from [facility] - Occupational Stress Questionnaire    Feeling of Stress : Patient declined  Social Connections: Moderately Integrated (10/22/2022)   Received from [facility]   Social Network    How would you rate your social network (family, work, friends)?: Adequate participation with social networks   Past Surgical History:  Procedure Laterality Date    BREAST EXCISIONAL BIOPSY Right    infection to right breast 5 years ago.   BREAST SURGERY  07/01/2011.   I & D of right breast abscess   CHOLECYSTECTOMY  1995   COLONOSCOPY WITH PROPOFOL  N/A 07/05/2016   Procedure: COLONOSCOPY WITH PROPOFOL ;  Surgeon: Elsie Cree, MD;  Location: [facility] ENDOSCOPY;  Service: Endoscopy;  Laterality: N/A;   LEFT HEART CATH AND CORONARY ANGIOGRAPHY N/A 03/26/2017   Procedure: LEFT HEART CATH AND CORONARY ANGIOGRAPHY;  Surgeon: Mady Bruckner, MD;  Location: [facility] INV[facility]IVE CV LAB;  Service: Cardiovascular;  Laterality: N/A;   NEPHROLITHOTOMY Right 12/26/2023   Procedure: NEPHROLITHOTOMY;  Surgeon: Cam Morene ORN, MD;  Location: [facility] ORS;  Service: Urology;  Laterality: Right;  RIGHT PERCUTANEOUS NEPHROLITHOTOMY   Past Surgical History:  Procedure Laterality Date   BREAST EXCISIONAL BIOPSY Right    infection to right breast 5 years ago.   BREAST SURGERY  07/01/2011.   I & D of right breast abscess   CHOLECYSTECTOMY  1995   COLONOSCOPY WITH PROPOFOL  N/A 07/05/2016   Procedure: COLONOSCOPY WITH PROPOFOL ;  Surgeon: Elsie Cree, MD;  Location: [facility];  Service: Endoscopy;  Laterality: N/A;   LEFT HEART CATH AND CORONARY ANGIOGRAPHY N/A 03/26/2017   Procedure: LEFT HEART CATH AND CORONARY ANGIOGRAPHY;  Surgeon: Mady Bruckner, MD;  Location: [facility] CV LAB;  Service: Cardiovascular;  Laterality: N/A;   NEPHROLITHOTOMY Right 12/26/2023   Procedure: NEPHROLITHOTOMY;  Surgeon: Cam Morene ORN, MD;  Location: [facility] ORS;  Service: Urology;  Laterality: Right;  RIGHT PERCUTANEOUS NEPHROLITHOTOMY   Past Medical History:  Diagnosis Date   Abnormal nuclear cardiac imaging test 03/23/2017   Arthritis    Bilateral primary osteoarthritis of knee 06/05/2016   Cellulitis and abscess of trunk 04/30/2012   Chest pain 02/13/2017   Chronic pain of left knee 10/31/2011   Significant medial compartment DJD bilaterally. Both knees had steroid injection done on October 31, 2011   Chronic pain syndrome 09/28/2016   Complication of anesthesia 1995   woke up on table right after gallbladder surgery   Constipation    Corneal ulcer 08/06/2012   Edema, lower extremity    Essential hypertension 02/13/2017   Family history of adverse reaction to anesthesia    sister coded twice with anesthesai not sure of details sister still living   Family history of breast cancer    Family history of uterine cancer    Gallbladder problem    Genetic testing 09/07/2016   Negative genetic testing on the Common Hereditary cancer panel.  The Hereditary Gene Panel offered by  [facility] includes sequencing and/or deletion duplication testing of the following 46 genes: APC, ATM, AXIN2, BARD1, BMPR1A, BRCA1, BRCA2, BRIP1, CDH1, CDKN2A (p14ARF), CDKN2A (p16INK4a), CHEK2, CTNNA1, DICER1, EPCAM (Deletion/duplication testing only), GREM1 (promoter region deletion/duplication te   Headache    migraines   History of kidney stones    Joint pain    Knee pain    Leg pain    Migraine    Mixed dyslipidemia 02/13/2017   Morbid obesity (HCC) 02/13/2017   Osteoarthritis    Pneumonia    Sleep apnea    SOBOE (shortness of breath on exertion)    BP 103/68   Pulse 74   Ht 5' 1 (1.549 m)   Wt 240 lb (108.9 kg)   LMP  (LMP Unknown)   SpO2 96%   BMI 45.35 kg/m   Opioid Risk Score:   Fall Risk Score:  `1  Depression screen [facility] 2/9     01/31/2024   10:23 AM 12/06/2023    9:55 AM 07/26/2023   11:20 AM 04/17/2023   11:38 AM 07/11/2022   10:56 AM 10/04/2021   11:00 AM 07/04/2021   11:27 AM  Depression screen PHQ 2/9  Decreased Interest 0 0 0 0 0 0 0  Down, Depressed, Hopeless 0 0 0 0 0 0 0  PHQ - 2 Score 0 0 0 0 0 0 0     Review of Systems  Musculoskeletal:  Positive for arthralgias, back pain and gait problem.  All other systems reviewed and are negative.      Objective:   Physical Exam Vitals and nursing note reviewed.  Constitutional:      Appearance: Normal appearance.   Cardiovascular:     Rate and Rhythm: Normal rate and regular rhythm.     Pulses: Normal pulses.     Heart sounds: Normal heart sounds.  Pulmonary:     Effort: Pulmonary effort is normal.     Breath sounds: Normal breath sounds.  Musculoskeletal:     Comments: Normal Muscle Bulk and Muscle Testing Reveals:  Upper Extremities: Full ROM and Muscle Strength  5/5 Bilateral Greater Trochanter Tenderness Lower Extremities: Decreased ROM and Muscle Strength 5/5 Bilateral Lower Extremity Flexion Produces Pain into her Bilateral Patella Arrived in scooter    Skin:    General: Skin is warm and dry.  Neurological:     Mental Status: She is alert and oriented to person, place, and time.  Psychiatric:        Mood and Affect: Mood normal.        Behavior: Behavior normal.          Assessment & Plan:  1. 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 Continue HEP as Tolerated. 01/31/2024 Continue  Oxycodone  10 mg one tablet 4 times a day as needed for pain #120. Will discussed with Dr Carilyn changing oxycodone  to Tramadol , Anita Booker-McSweeney reports she had relief with Tramadol  when she was hospitilized. We will continue the opioid monitoring program, this consists of regular clinic visits, examinations, urine drug screen, pill counts as well as use of [place]  Controlled Substance Reporting system. A 12 month History has been reviewed on the [place]  Controlled Substance Reporting System on 01/31/2024. 2. Bilateral Hip osteoarthritis: Continue HEP as Tolerated. Continue to Monitor. 01/31/2024  3. Bilateral Greater Trochanteric Tenderness: Continue to Alternate Ice and Heat Therapy. Continue to Monitor. 01/31/2024 4. 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 Neurology Following. Continue to Monitor. 01/31/2024   F/U in  2 months: [employer] [place]

## 2024-02-04 ENCOUNTER — Telehealth: Payer: Self-pay | Admitting: Registered Nurse

## 2024-02-04 ENCOUNTER — Other Ambulatory Visit (HOSPITAL_COMMUNITY): Payer: Self-pay

## 2024-02-04 ENCOUNTER — Other Ambulatory Visit: Payer: Self-pay

## 2024-02-04 MED ORDER — TRAMADOL HCL 50 MG PO TABS
50.0000 mg | ORAL_TABLET | Freq: Four times a day (QID) | ORAL | 0 refills | Status: DC
  Filled 2024-02-04: qty 240, 30d supply, fill #0

## 2024-02-04 NOTE — Telephone Encounter (Signed)
 Spoke with Dr Carilyn.  Ms. Sebastian Johnita Anita Booker, she was instructed to decreased Oxycodone  to three times a day for two days then, she will begin Tramadol  50 mg tablets 1- 2 two tablets every 6 hours as needed for pain+ MME 80.00 Call placed to [name], regarding the above, she verbalizes understanding

## 2024-02-05 LAB — DRUG TOX MONITOR 1 W/CONF, ORAL FLD
Amphetamines: NEGATIVE ng/mL (ref <10)
Barbiturates: NEGATIVE ng/mL (ref <10)
Benzodiazepines: NEGATIVE ng/mL (ref <0.50)
Buprenorphine: NEGATIVE ng/mL (ref <0.10)
Cocaine: NEGATIVE ng/mL (ref <5.0)
Codeine: NEGATIVE ng/mL (ref <2.5)
Dihydrocodeine: NEGATIVE ng/mL (ref <2.5)
Fentanyl: NEGATIVE ng/mL (ref <0.10)
Heroin Metabolite: NEGATIVE ng/mL (ref <1.0)
Hydrocodone: NEGATIVE ng/mL (ref <2.5)
Hydromorphone: NEGATIVE ng/mL (ref <2.5)
MARIJUANA: NEGATIVE ng/mL (ref <2.5)
MDMA: NEGATIVE ng/mL (ref <10)
Meprobamate: NEGATIVE ng/mL (ref <2.5)
Methadone: NEGATIVE ng/mL (ref <5.0)
Morphine: NEGATIVE ng/mL (ref <2.5)
Nicotine Metabolite: NEGATIVE ng/mL (ref <5.0)
Norhydrocodone: NEGATIVE ng/mL (ref <2.5)
Noroxycodone: 6 ng/mL — ABNORMAL HIGH (ref <2.5)
Opiates: POSITIVE ng/mL — AB (ref <2.5)
Oxycodone: >250 ng/mL — ABNORMAL HIGH (ref <2.5)
Oxymorphone: NEGATIVE ng/mL (ref <2.5)
Phencyclidine: NEGATIVE ng/mL (ref <10)
Tapentadol: NEGATIVE ng/mL (ref <5.0)
Tramadol: NEGATIVE ng/mL (ref <5.0)
Zolpidem: NEGATIVE ng/mL (ref <5.0)

## 2024-02-05 LAB — DRUG TOX ALC METAB W/CON, ORAL FLD: Alcohol Metabolite: NEGATIVE ng/mL (ref <25)

## 2024-02-11 ENCOUNTER — Other Ambulatory Visit: Payer: Self-pay

## 2024-02-19 ENCOUNTER — Other Ambulatory Visit: Payer: Self-pay

## 2024-02-19 ENCOUNTER — Other Ambulatory Visit (HOSPITAL_COMMUNITY): Payer: Self-pay

## 2024-02-19 NOTE — Progress Notes (Signed)
 Pharmacy Quality Measure Review  This patient is appearing on a report for being at risk of failing the adherence measure for hypertension (ACEi/ARB) medications this calendar year.   Medication: valsartan  320 mg Last fill date: 11/29/23 for 90 day supply  Insurance report was not up to date. No action needed at this time. Facilitated refill with dispensing pharmacy.   Woodie Jock, PharmD PGY1 Pharmacy Resident  02/19/2024

## 2024-02-20 ENCOUNTER — Other Ambulatory Visit: Payer: Self-pay

## 2024-02-20 ENCOUNTER — Other Ambulatory Visit (HOSPITAL_COMMUNITY): Payer: Self-pay

## 2024-02-21 ENCOUNTER — Other Ambulatory Visit (HOSPITAL_COMMUNITY): Payer: Self-pay

## 2024-02-21 ENCOUNTER — Other Ambulatory Visit: Payer: Self-pay

## 2024-02-22 ENCOUNTER — Other Ambulatory Visit: Payer: Self-pay

## 2024-02-25 ENCOUNTER — Other Ambulatory Visit: Payer: Self-pay | Admitting: Registered Nurse

## 2024-02-25 ENCOUNTER — Other Ambulatory Visit: Payer: Self-pay

## 2024-02-25 ENCOUNTER — Other Ambulatory Visit (HOSPITAL_COMMUNITY): Payer: Self-pay

## 2024-02-25 MED ORDER — PHENTERMINE HCL 37.5 MG PO TABS
ORAL_TABLET | ORAL | 0 refills | Status: DC
  Filled 2024-02-25 – 2024-03-02 (×2): qty 45, 30d supply, fill #0

## 2024-02-25 MED ORDER — TRAMADOL HCL 50 MG PO TABS
50.0000 mg | ORAL_TABLET | Freq: Four times a day (QID) | ORAL | 0 refills | Status: DC
  Filled 2024-02-25 – 2024-03-05 (×4): qty 240, 30d supply, fill #0

## 2024-02-25 MED ORDER — FUROSEMIDE 20 MG PO TABS
20.0000 mg | ORAL_TABLET | Freq: Every day | ORAL | 1 refills | Status: AC
  Filled 2024-03-02 – 2024-04-02 (×2): qty 90, 90d supply, fill #0

## 2024-02-26 ENCOUNTER — Other Ambulatory Visit: Payer: Self-pay

## 2024-02-26 ENCOUNTER — Encounter: Payer: Self-pay | Admitting: Pharmacist

## 2024-02-29 ENCOUNTER — Other Ambulatory Visit: Payer: Self-pay

## 2024-03-03 ENCOUNTER — Other Ambulatory Visit (HOSPITAL_COMMUNITY): Payer: Self-pay

## 2024-03-03 ENCOUNTER — Other Ambulatory Visit: Payer: Self-pay

## 2024-03-04 ENCOUNTER — Other Ambulatory Visit (HOSPITAL_COMMUNITY): Payer: Self-pay

## 2024-03-05 ENCOUNTER — Other Ambulatory Visit (HOSPITAL_COMMUNITY): Payer: Self-pay

## 2024-03-05 ENCOUNTER — Other Ambulatory Visit: Payer: Self-pay

## 2024-03-06 ENCOUNTER — Other Ambulatory Visit (HOSPITAL_COMMUNITY): Payer: Self-pay

## 2024-03-06 ENCOUNTER — Other Ambulatory Visit: Payer: Self-pay

## 2024-03-19 ENCOUNTER — Other Ambulatory Visit: Payer: Self-pay

## 2024-03-19 ENCOUNTER — Other Ambulatory Visit (HOSPITAL_COMMUNITY): Payer: Self-pay

## 2024-03-28 ENCOUNTER — Other Ambulatory Visit: Payer: Self-pay | Admitting: Physical Medicine & Rehabilitation

## 2024-03-28 ENCOUNTER — Other Ambulatory Visit (HOSPITAL_COMMUNITY): Payer: Self-pay

## 2024-03-29 ENCOUNTER — Other Ambulatory Visit (HOSPITAL_COMMUNITY): Payer: Self-pay

## 2024-03-31 ENCOUNTER — Other Ambulatory Visit (HOSPITAL_COMMUNITY): Payer: Self-pay

## 2024-03-31 ENCOUNTER — Telehealth: Payer: Self-pay | Admitting: Registered Nurse

## 2024-03-31 DIAGNOSIS — G894 Chronic pain syndrome: Secondary | ICD-10-CM

## 2024-03-31 DIAGNOSIS — M170 Bilateral primary osteoarthritis of knee: Secondary | ICD-10-CM

## 2024-03-31 MED ORDER — ZOLPIDEM TARTRATE 10 MG PO TABS
10.0000 mg | ORAL_TABLET | Freq: Every day | ORAL | 0 refills | Status: DC
  Filled 2024-04-02: qty 30, 30d supply, fill #0

## 2024-03-31 MED ORDER — TRAMADOL HCL 50 MG PO TABS
50.0000 mg | ORAL_TABLET | Freq: Four times a day (QID) | ORAL | 5 refills | Status: AC
  Filled 2024-03-31 – 2024-04-04 (×4): qty 240, 30d supply, fill #0

## 2024-03-31 NOTE — Telephone Encounter (Signed)
 PDMP was Reviewed.  Tramadol  e-scribed to pharmacy. Call placed to Ms. Thompson regarding the above, she reports Tramadol  managibng her pain . She will be scheduled every 6 months. She verbalizes understanding.

## 2024-03-31 NOTE — Telephone Encounter (Signed)
 Requested Prescriptions   Pending Prescriptions Disp Refills   traMADol  (ULTRAM ) 50 MG tablet 240 tablet 0    Sig: Take 1-2 tablets (50-100 mg total) by mouth every 6 (six) hours.     Date of patient request: 03/28/24 Last office visit: 12/06/2023 with Dr. Carilyn, 11/13 with Fidela Upcoming visit: 04/15/24 with Fidela Date of last refill: 02/25/2024 Last refill amount: #240 0 refills

## 2024-04-01 ENCOUNTER — Other Ambulatory Visit (HOSPITAL_COMMUNITY): Payer: Self-pay

## 2024-04-02 ENCOUNTER — Other Ambulatory Visit (HOSPITAL_COMMUNITY): Payer: Self-pay

## 2024-04-03 ENCOUNTER — Telehealth (HOSPITAL_COMMUNITY): Payer: Self-pay

## 2024-04-03 ENCOUNTER — Other Ambulatory Visit: Payer: Self-pay

## 2024-04-03 ENCOUNTER — Other Ambulatory Visit (HOSPITAL_COMMUNITY): Payer: Self-pay

## 2024-04-03 MED ORDER — TROSPIUM CHLORIDE ER 60 MG PO CP24
60.0000 mg | ORAL_CAPSULE | Freq: Every day | ORAL | 3 refills | Status: AC
  Filled 2024-04-03 – 2024-04-04 (×2): qty 30, 30d supply, fill #0

## 2024-04-03 NOTE — Telephone Encounter (Signed)
 Pharmacy Patient Advocate Encounter   Received notification from Pt Calls Messages that prior authorization for traMADol  HCl 50MG  tablets  is required/requested.   Insurance verification completed.   The patient is insured through [employer].   Per test claim: PA required; PA submitted to above mentioned insurance via Latent Key/confirmation #/EOC [id number] Status is pending

## 2024-04-03 NOTE — Telephone Encounter (Signed)
 PA request has been Received. New Encounter has been or will be created for follow up. For additional info see Pharmacy Prior Auth telephone encounter from 04/03/24.

## 2024-04-03 NOTE — Telephone Encounter (Signed)
 Pharmacy Patient Advocate Encounter  Received notification from [facility] that Prior Authorization for  traMADol  HCl 50MG  tablets  has been APPROVED from 04/03/24 to 04/03/25. Ran test claim, Copay is $5. This test claim was processed through [facility]- copay amounts may vary at other pharmacies due to pharmacy/plan contracts, or as the patient moves through the different stages of their insurance plan.   PA #/Case ID/Reference #: [id number]

## 2024-04-04 ENCOUNTER — Other Ambulatory Visit (HOSPITAL_COMMUNITY): Payer: Self-pay

## 2024-04-04 ENCOUNTER — Other Ambulatory Visit: Payer: Self-pay

## 2024-04-07 ENCOUNTER — Other Ambulatory Visit: Payer: Self-pay

## 2024-04-07 ENCOUNTER — Encounter: Payer: Self-pay | Admitting: Pharmacist

## 2024-04-08 ENCOUNTER — Other Ambulatory Visit (HOSPITAL_COMMUNITY): Payer: Self-pay

## 2024-04-08 ENCOUNTER — Other Ambulatory Visit: Payer: Self-pay

## 2024-04-08 MED ORDER — ZEPBOUND 5 MG/0.5ML ~~LOC~~ SOAJ
5.0000 mg | SUBCUTANEOUS | 0 refills | Status: AC
  Filled 2024-04-08: qty 2, 28d supply, fill #0

## 2024-04-09 ENCOUNTER — Other Ambulatory Visit: Payer: Self-pay

## 2024-04-11 ENCOUNTER — Other Ambulatory Visit: Payer: Self-pay

## 2024-04-11 ENCOUNTER — Other Ambulatory Visit (HOSPITAL_COMMUNITY): Payer: Self-pay

## 2024-04-15 ENCOUNTER — Other Ambulatory Visit: Payer: Self-pay

## 2024-04-15 ENCOUNTER — Encounter: Admitting: Registered Nurse

## 2024-04-20 ENCOUNTER — Other Ambulatory Visit (HOSPITAL_COMMUNITY): Payer: Self-pay

## 2024-04-21 ENCOUNTER — Other Ambulatory Visit (HOSPITAL_COMMUNITY): Payer: Self-pay

## 2024-04-21 MED ORDER — PHENTERMINE HCL 37.5 MG PO TABS
37.5000 mg | ORAL_TABLET | Freq: Every day | ORAL | 1 refills | Status: AC
  Filled 2024-04-21: qty 30, 30d supply, fill #0

## 2024-04-22 ENCOUNTER — Other Ambulatory Visit: Payer: Self-pay

## 2024-04-22 ENCOUNTER — Encounter: Payer: Self-pay | Admitting: Pharmacist

## 2024-04-23 ENCOUNTER — Other Ambulatory Visit (HOSPITAL_COMMUNITY): Payer: Self-pay

## 2024-04-23 MED ORDER — ZOLPIDEM TARTRATE 10 MG PO TABS: 10.0000 mg | ORAL_TABLET | Freq: Every day | ORAL | 0 refills | Status: AC

## 2024-04-24 ENCOUNTER — Other Ambulatory Visit: Payer: Self-pay

## 2024-04-24 ENCOUNTER — Other Ambulatory Visit (HOSPITAL_COMMUNITY): Payer: Self-pay

## 2024-04-24 ENCOUNTER — Encounter: Payer: Self-pay | Admitting: Pharmacy Technician

## 2024-04-25 ENCOUNTER — Other Ambulatory Visit: Payer: Self-pay

## 2024-07-29 ENCOUNTER — Encounter: Admitting: Registered Nurse
